# Patient Record
Sex: Male | Born: 1945 | Race: White | Hispanic: No | Marital: Single
Health system: Midwestern US, Community
[De-identification: ages and names within clinical notes are randomized; demographics above are authoritative.]

## PROBLEM LIST (undated history)

## (undated) DIAGNOSIS — Z96642 Presence of left artificial hip joint: Secondary | ICD-10-CM

## (undated) DIAGNOSIS — Z7901 Long term (current) use of anticoagulants: Secondary | ICD-10-CM

## (undated) DIAGNOSIS — F32A Depression, unspecified: Secondary | ICD-10-CM

## (undated) DIAGNOSIS — M170 Bilateral primary osteoarthritis of knee: Secondary | ICD-10-CM

## (undated) DIAGNOSIS — J42 Unspecified chronic bronchitis: Secondary | ICD-10-CM

## (undated) DIAGNOSIS — R5381 Other malaise: Secondary | ICD-10-CM

## (undated) DIAGNOSIS — J449 Chronic obstructive pulmonary disease, unspecified: Secondary | ICD-10-CM

## (undated) DIAGNOSIS — I82403 Acute embolism and thrombosis of unspecified deep veins of lower extremity, bilateral: Secondary | ICD-10-CM

## (undated) DIAGNOSIS — C4490 Unspecified malignant neoplasm of skin, unspecified: Secondary | ICD-10-CM

## (undated) DIAGNOSIS — Z87891 Personal history of nicotine dependence: Secondary | ICD-10-CM

## (undated) DIAGNOSIS — E43 Unspecified severe protein-calorie malnutrition: Secondary | ICD-10-CM

## (undated) DIAGNOSIS — Z9889 Other specified postprocedural states: Secondary | ICD-10-CM

## (undated) DIAGNOSIS — Z9981 Dependence on supplemental oxygen: Secondary | ICD-10-CM

## (undated) DIAGNOSIS — J439 Emphysema, unspecified: Secondary | ICD-10-CM

## (undated) DIAGNOSIS — H269 Unspecified cataract: Secondary | ICD-10-CM

## (undated) HISTORY — DX: Unspecified chronic bronchitis: J42

## (undated) HISTORY — PX: INGUINAL HERNIA REPAIR: SHX194

## (undated) HISTORY — DX: Personal history of nicotine dependence: Z87.891

## (undated) HISTORY — DX: Chronic obstructive pulmonary disease, unspecified: J44.9

## (undated) HISTORY — DX: Bilateral primary osteoarthritis of knee: M17.0

## (undated) HISTORY — DX: Unspecified cataract: H26.9

## (undated) HISTORY — PX: APPENDECTOMY: SHX54

## (undated) HISTORY — DX: Depression, unspecified: F32.A

## (undated) HISTORY — DX: Unspecified malignant neoplasm of skin, unspecified: C44.90

## (undated) HISTORY — DX: Other specified postprocedural states: Z98.890

## (undated) HISTORY — DX: Emphysema, unspecified: J43.9

---
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
## 1980-11-28 DIAGNOSIS — G40909 Epilepsy, unspecified, not intractable, without status epilepticus: Secondary | ICD-10-CM

## 1980-11-28 HISTORY — DX: Epilepsy, unspecified, not intractable, without status epilepticus: G40.909

## 2009-06-08 DIAGNOSIS — G40909 Epilepsy, unspecified, not intractable, without status epilepticus: Secondary | ICD-10-CM | POA: Insufficient documentation

## 2011-05-03 LAB — TYPE AND SCREEN
ABO/Rh: A POS
Antibody Screen: NEGATIVE

## 2011-05-03 LAB — URINALYSIS W/ RFLX MICROSCOPIC
Bilirubin: NEGATIVE
Glucose: NEGATIVE MG/DL
Ketone: NEGATIVE MG/DL
Leukocyte Esterase: NEGATIVE
Nitrites: NEGATIVE
Protein: NEGATIVE MG/DL
Specific gravity: 1.01 (ref 1.003–1.030)
Urobilinogen: 0.2 EU/DL (ref 0.2–1.0)
pH (UA): 6 (ref 5.0–8.0)

## 2011-05-03 LAB — PROTHROMBIN TIME + INR
INR: 0.9 (ref 0.0–1.2)
Prothrombin time: 12.2 s (ref 11.5–15.2)

## 2011-05-03 LAB — METABOLIC PANEL, BASIC
Anion gap: 6 mmol/L (ref 5–15)
BUN/Creatinine ratio: 11 — ABNORMAL LOW (ref 12–20)
BUN: 10 MG/DL (ref 7–18)
CO2: 30 MMOL/L (ref 21–32)
Calcium: 8.3 MG/DL — ABNORMAL LOW (ref 8.4–10.4)
Chloride: 102 MMOL/L (ref 100–108)
Creatinine: 0.9 MG/DL (ref 0.6–1.3)
GFR est AA: >60 mL/min/{1.73_m2} (ref >60)
GFR est non-AA: >60 mL/min/{1.73_m2} (ref >60)
Glucose: 91 MG/DL (ref 74–99)
Potassium: 4.5 MMOL/L (ref 3.5–5.5)
Sodium: 138 MMOL/L (ref 136–145)

## 2011-05-03 LAB — CBC W/O DIFF
HCT: 46.9 % (ref 36.0–48.0)
HGB: 15.8 g/dL (ref 13.0–16.0)
MCH: 32.8 PG (ref 24.0–34.0)
MCHC: 33.7 g/dL (ref 31.0–37.0)
MCV: 97.5 FL — ABNORMAL HIGH (ref 74.0–97.0)
MPV: 10 FL (ref 9.2–11.8)
PLATELET: 304 10*3/uL (ref 135–420)
RBC: 4.81 M/uL (ref 4.70–5.50)
RDW: 12.8 % (ref 11.6–14.5)
WBC: 7.3 10*3/uL (ref 4.6–13.2)

## 2011-05-03 LAB — HEMOGLOBIN A1C WITH EAG: Hemoglobin A1c: 5.4 % (ref 4.8–6.0)

## 2011-05-03 LAB — PTT: aPTT: 30.3 s (ref 24.6–37.7)

## 2011-05-03 LAB — TYPE & SCREEN
ABO/Rh(D): A POS
Antibody screen: NEGATIVE

## 2011-05-03 LAB — URINE MICROSCOPIC ONLY
RBC: 0 /HPF (ref 0–5)
WBC: 0 /HPF (ref 0–4)

## 2011-05-03 LAB — ALBUMIN: Albumin: 3.7 g/dL (ref 3.4–5.0)

## 2011-05-04 LAB — CULTURE, URINE
Culture result:: NO GROWTH
Culture: NO GROWTH

## 2011-05-13 NOTE — H&P (Deleted)
[facility]                               [address] [place], [place] [address]                                   PRE-ADMISSION                             HISTORY AND PHYSICAL    PATIENT:    [name] [name]  MRN:           [id number]     DATE:   05/03/2011  BILLING:       [id number]    ROOM:  DICTATING:  Gifford Shave, MD,FRCSC      CHIEF COMPLAINT: Right hip end-stage osteoarthritis.    HISTORY OF PRESENT ILLNESS: The patient is a pleasant, 65 year old  gentleman with long standing history of right hip pain, has failed  conservative measures; therefore, admitted for surgical intervention as  indicated. The ________alternatives, including but not limited to  infection, blood transfusion, neurovascular damage, bone fracture,  anesthetic complications, DVT, PE, postoperative stiffness, longevity, leg  length discrepancy, hip dislocation, need for more surgery, MI and stroke  were discussed. The patient understands and agrees for surgery.    PAST MEDICAL HISTORY: Positive for COPD, hepatitis, asthma.    MEDICATIONS: Include,  1. Tegretol.  2. Spiriva.  3. His aspirin on hold.  4. Albuterol.    ALLERGIES: NO KNOWN DRUG ALLERGIES.    PAST SURGICAL HISTORY: None.    PHYSICAL EXAMINATION  GENERAL: Alert and oriented x3, in no acute distress, well-developed.  HEART: S1 and S2, regular rate and rhythm.  LUNGS: Clear to auscultation bilaterally.  ABDOMEN: Soft, nontender, nondistended.  EXTREMITIES: Good pulses, dorsalis pedis, tibial, sensory intact to light.  Leg lengths revealed the right lower extremity slightly shorter grossly  sitting in a chair.    Preoperative labs reviewed, within normal limits. Urinalysis negative.  Radiographs including AP pelvis, AP right hip, cross-table of the right hip  show significant end-stage osteoarthritis with bone-on-bone eburnation.    ASSESSMENT: Right hip end-stage osteoarthritis.     PLAN: At this point, proceed with a right total knee replacement. Again,  alternatives, risks and complications, as well as expected outcomes were  discussed. The patient understands surgery.        Dictated By: Sharen Hones, PA-C                  Date:______Time:______Signature________________________________          Gifford Shave, MD,FRCSC    ALM:wmx  D: 05/13/2011   9:51 A  T: 05/13/2011  10:11 A  Job: [id number]  CScriptDoc #: [id number]    cc:    Gifford Shave, MD,FRCSC

## 2011-05-13 NOTE — H&P (Unsigned)
[facility]                               [address] [place], [place] [address]                                   PRE-ADMISSION                             HISTORY AND PHYSICAL    PATIENT:    [name] [name], [name] [name]  MRN:           [id number]     DATE:   05/17/2011  BILLING:       [id number]    ROOM:  DICTATING:  Gifford Shave, MD,FRCSC      CHIEF COMPLAINT: Right hip end-stage osteoarthritis.    HISTORY OF PRESENT ILLNESS: The patient is a pleasant, 65 year old  gentleman with longstanding history of right hip pain, has failed  conservative measures; therefore, admitted for surgical intervention as  indicated. The risks and including but not limited to infection, blood  transfusion, neurovascular damage, bone fracture, anesthetic complications,  DVT, PE, postoperative stiffness, longevity, leg length discrepancy, hip  dislocation, need for more surgery, MI and stroke were discussed. The  patient understands and agrees for surgery.    PAST MEDICAL HISTORY: Positive for COPD, hepatitis and asthma.    MEDICATIONS: Include  1. Tegretol.  2. Spiriva.  3. His aspirin on hold.  4. Albuterol.    ALLERGIES: NO KNOWN DRUG ALLERGIES.    PAST SURGICAL HISTORY: None.    PHYSICAL EXAMINATION  GENERAL: Alert and oriented x3, in no acute distress, well-developed.  HEART: S1 and S2, regular rate and rhythm.  LUNGS: Clear to auscultation bilaterally.  ABDOMEN: Soft, nontender, nondistended.  EXTREMITIES: Good pulses, dorsalis pedis, tibial. Sensory intact to light.  Leg lengths revealed the right lower extremity slightly shorter grossly  sitting in a chair.    Preoperative labs reviewed, within normal limits. Urinalysis negative.  Radiographs including AP pelvis, AP right hip, cross-table of the right hip  show significant end-stage osteoarthritis with bone-on-bone eburnation.    ASSESSMENT: Right hip end-stage osteoarthritis.     PLAN: At this point, proceed with a right total knee replacement. Again,  alternatives, risks and complications, as well as expected outcomes were  discussed. The patient understands surgery.        Dictated By: Sharen Hones, PA-C                  Date:______Time:______Signature________________________________          Gifford Shave, MD,FRCSC    ALM:wmx  D: 05/13/2011   9:51 A  T: 05/13/2011  10:11 A  Job: [id number]  CScriptDoc #: [id number]    cc:   Gifford Shave, MD,FRCSC

## 2011-05-17 LAB — TYPE AND SCREEN
ABO/Rh: A POS
Antibody Screen: NEGATIVE

## 2011-05-17 LAB — TYPE & SCREEN
ABO/Rh(D): A POS
Antibody screen: NEGATIVE

## 2011-05-17 NOTE — Op Note (Unsigned)
[facility]                               [address] [place], [place] [address]                                 OPERATIVE REPORT    PATIENT:     [name] [name], [name] [name]  MRN:            [id number]    DATE:   05/17/2011  BILLING:     [id number]  ROOM:        [id number]  ATTENDING:   Gifford Shave, MD,FRCSC  DICTATING:   Gifford Shave, MD,FRCSC      PREOPERATIVE DIAGNOSIS: End-stage arthritis, right hip, with significant  ankylosis and pericapsular scarring, end-stage arthritis, right hip.    POSTOPERATIVE DIAGNOSIS: End-stage arthritis, right hip, with significant  ankylosis and pericapsular scarring, end-stage arthritis, right hip.    OPERATIVE PROCEDURE: Right total hip replacement using the Synergy system  with [name] size 13 high offset femoral component, [name] 60 R3 cup with 2-screw  augmentation and [name] neutral 36 liner, 0+36 Oxinium femoral head with [name]  partial capsulectomy and removal of osteophytosis.    SURGEON:  Paris Lore, MD    FIRST ASSISTANT: Carin Hock.    SECOND ASSISTANT: Merian Capron.    ANESTHESIA: Dr. Junius Finner, preoperative lumbar plexus block with light  general.    DESCRIPTION OF OPERATIVE PROCEDURE: After the anesthetic was successfully  induced, it was confirmed the patient did receive preoperative antibiotics  and Coumadin.  The ________requires opposite hip to be replaced.  He understands he will have [name] leg length discrepancy until both hips have  been replaced.    The patient was placed in lateral decubitus position, taking great care to  pad all sensitive areas and ensure the pelvis was square. The appropriate  time-out was performed, confirmed the patient did receive an antibiotic and  also Coumadin. Lateral approach to the hip performed. Good hemostasis  achieved with electrocautery. IT band delineated and incised sharply.  Sciatic nerve identified, protected and avoided, and Charnley retractor   introduced. The bursa was released using the capsular scissors. He did have  [name] partial chronic muscle tear involving the lateral aspect of the greater  trochanter, which had about [name] 1 cm bald area.  We roughened the bone up and  repaired this later.    Minimus and capsule were divided directly over femoral neck. The abductors  were in modest condition due to disuse.    Minimus and capsule were divided directly over the femoral neck, carrying  back to the tip of the greater trochanter entry and down the vastus  lateralis. The whole cuff of tissue was taken in 1 contiguous layer and the  lesser trochanter identified. [name] pin was placed in the superior iliac crest  for offset leg length measurement. We released some further capsule and  dislocated the hip uneventfully.    Using [name] smallish fingerbreadth above the lesser trochanter as per the  preoperative templating, femoral neck was divided with 2 blunt Hohmann's  protecting the soft tissue structures. Posterior capsule was somewhat  adherent and reflected with the Cobb elevator.  Blunt Hohmann placed in the anterior acetabulum, which was fairly  hypoplastic as well. Bent sharp Hohmann posteriorly, transverse acetabular  had hypertrophied and was calcified and was taken down with [name] combination  of electrocautery and rongeur and bent blunt Hohmann placed inferiorly.  It  gave [place] excellent exposure of the cup. We debrided the cup in the usual  fashion.  We identified the medial wall through the foveal notch and then  medialized appropriately in appropriate inclination and anteversion, reamed  up to accommodate the cup, initially underreamed by 1 x 1 for alignment to  align the ream.    Trialed this. I was very happy with the 60.  Implanted the definitive  component, which seated well and am ensured that we are at the appropriate  inclination and anteversion.    Augmented with [name] couple of screws in the safe zone with tap-drill style   technique removed the osteophytosis and then used the Aquamantys and  [employer] preparation.    ________the apex hole eliminator screw, followed by the definitive liner,  made sure it was fully seated and checked it with [name] Glorious Peach, used the  Colgate preparation around the cup. Protected with [name]  Ray-Tec gauze that later would be accounted for.    Placed the leg in [name] sterile leg bag and protected the abductors,  lateralized appropriately using [name] box osteotome and Leksell rongeur and  then reamed and broached sequentially to accommodate the 13. Used the  calcar planer and trialed this.  I was very happy with the 0 ball. The hip  was very stable and there was no impingement. We checked specifically in  external rotation, extension, flexion, adduction, internal rotation, drop  kick and shuck test, all of which we were delighted with. There was no  impingement, very stable hip.    Pulse lavaged out, implanted the definitive component, which seated well  and retrialed, and I was happiest with the 0, cleaned the trunnion,  impacted on definitive head, again reducing the hip.    Routine closure after further pulse lavage and further controlled  hemostasis and clips for the skin.    At the end of the case, instrument, sponge and needle counts were correct.  There were no complications.    The patient tolerated the procedure well.    Blood loss just under 300 mL.    The patient tolerated the procedure well and I was quite delighted with the  outcome of the case.                        Date:______Time:______Signature________________________________          Gifford Shave, MD,FRCSC    ALM:wmx  D: 05/17/2011 T: 05/17/2011  7:08 P  Job: [id number]  CScriptDoc #: [id number]    cc:     Gifford Shave, MD,FRCSC

## 2011-05-17 NOTE — Consults (Signed)
[facility]                               [address] [place], [place] [address]                                CONSULTATION REPORT    PATIENT:    [name] [name], [name] [name]  MRN:            [id number]  ADMIT DATE:   05/17/2011  BILLING:        [id number] CONSULTED:    05/17/2011  ROOM:       [id number]  ATTENDING:  Gifford Shave, MD,FRCSC  DICTATING:  Zollie Pee, MD        REQUESTING PHYSICIAN: Paris Lore, MD    CONSULTING PHYSICIAN: Zollie Pee, MD    REASON FOR CONSULTATION: COPD, nicotine dependence, postoperative right hip  replacement surgery.    HISTORY OF PRESENT ILLNESS: This is a pleasant, 65 year old male patient  who has presented to the hospital after all the conservative management  failed to control his pain secondary to the osteoarthritis of his right  hip. The patient is postop, and his pain seems to be controlled and after  the surgery. According to the patient, he has been diagnosed with COPD for  the last 3 or 4 years, when he had a pulmonary function test done. He has  been started on albuterol and Spiriva. Currently he is well controlled. He  has had no recent hospitalization in the last 2-3 years. He reported no  pneumonia. He has been up-to-date with his flu vaccine, but no pneumonia  vaccination. The patient also reports no issues with walking on flat  surfaces or lifting stuff around; however, he gets short of breath on  taking a flight of stairs. He reports no limitation to activity of daily  living. No chronic cough or phlegm, wheeze, hemoptysis, chest tightness or  pressure symptoms. He also denied any palpitations, lightheadedness,  orthopnea, paroxysmal nocturnal dyspnea, leg swelling, syncope. The patient  denied any recent travel, sick contact, fever, chills, malaise, change in  his appetite or weight. He is currently followed up by his medical doctor,   Dr. Odis Luster. The patient reports no previous ICU admission, BiPAP  or ventilator use, steroid use, recent antibiotic therapy or  hospitalization for COPD exacerbation.    PAST MEDICAL HISTORY: Significant for COPD, nicotine dependence, basal cell  cancer of the skin, seizure disorder.    PAST SURGICAL HISTORY: Inguinal herniorrhaphy, appendectomy.    ALLERGIES: NO KNOWN DRUG ALLERGIES.    SOCIAL HISTORY: He is an active smoker, smokes 1 pack per day for the last  45 years or more. He has tried to quit cold [place], with success for 2  years maximum. Currently he has a plan to quit; no set date. He does not  want to use any medications or pharmacological options. He is retired and  lives on his own.    FAMILY HISTORY: His father died of lung cancer, also had coronary artery  disease, MI. Mother is alive and healthy. Sisters are alive and healthy.  Daughter is alive and healthy.    REVIEW OF SYSTEMS  GENERAL: He reports no fever, chills,  malaise, change in his appetite or  weight.  HEENT: No headache, blurred vision, change in his voice, hearing,  swallowing.  NECK: No neck pain. No thyroid adenopathy or other mass or enlargement.  CARDIOVASCULAR: As above.  RESPIRATORY: As above.  GASTROINTESTINAL: No nausea, vomiting, abdominal pain, change in his bowel  habits, blood in the stool.  GENITOURINARY: No dysuria, urgency, frequency, hematuria.  MUSCULOSKELETAL: As above.  SKIN: No rash. No itching.  ENDOCRINE: No personal history of diabetes or thyroid disorders.  HEMATOLOGIC: No bleeding, clotting disorders.  NEUROLOGY: As above.    Other review of systems is per HPI or noncontributory.    PHYSICAL EXAMINATION  VITAL SIGNS: Temperature 97.6, heart rate 60, blood pressure 129/60,  respiratory rate 16, saturation oxygen 95% on room air.  HEENT: Head normocephalic, atraumatic. Pupils are equally round, reactive  to light and accommodation. Sclerae white. Conjunctivae pale. Oropharyngeal   mucous membranes moist, without any mass lesion or exudate.  NECK: Thin, supple. No JVD. No thyroid. No adenopathy. No other mass  palpable.  CARDIOVASCULAR: S1, S2 regular rate and rhythm without any murmur, gallop,  rub.  CHEST: Prolonged expiratory phase with inspiratory rhonchi, but no  expiratory wheezes. There is no dullness to percussion, no tenderness on  palpation, no rash on inspection.  ABDOMEN: Obese. Bowel sounds normoactive. Soft, nontender, without any  organomegaly.  EXTREMITIES: Without any edema, cyanosis, clubbing. No calf tenderness on  palpation.    LABORATORY AND DIAGNOSTIC DATA: Chest x-ray from 05/03/2011 shows no  evidence of acute cardiopulmonary disease. Heart and mediastinum  unremarkable. No evidence of pleural effusion, moderate thoracic  spondylosis, 2 calcified granulomata in the left lung lower zone.    IMPRESSION: The patient is now presenting with a previous history of  chronic obstructive pulmonary disease, osteoarthritis, right hip pain from  osteoarthritis, failed conservative management. He is postop. He is  currently controlled with his pain medication. At this point his chronic  obstructive pulmonary disease seems to be stable, without any bronchospasm  or exacerbation, though given his chronic obstructive pulmonary disease, I  have discussed a couple of perioperative complications in a patient with  chronic obstructive pulmonary disease, including aspiration pneumonias,  hospital-acquired pneumonias, atelectasis, etc., and need for some  measures, including early ambulation, use of incentive spirometer, deep  vein thrombosis prophylaxis. The patient understands and is agreeable.  Smoking cessation was also discussed at length with the patient. He seems  to be motivated and wants to do it without any pharmacological help. I have  also discussed with the patient the importance of prevention in the form of   vaccination. He seems to be up-to-date with his flu vaccination, but he may  benefit from pneumonia vaccination, which the patient is agreeable to.    RECOMMENDATIONS: I would continue the patient's Spiriva and albuterol. At  the same time I would have the patient given pneumonia vaccination during  this hospitalization, at the same time continue DVT prophylaxis, physical  therapy and pain control per surgery team. Incentive spirometer training  and treatment were also discussed with the patient.    Thank you very much for involving me in the care of this patient. I will  follow up this patient in the hospital with you.                         Electronically Signed   Zollie Pee, MD 06/25/2011 14:44  Zollie Pee, MD    AP:[email or web address]  D: 05/17/2011 T: 05/17/2011  6:37 P  Job: [id number]  CScriptDoc #: [id number]  cc:   Gifford Shave, MD,FRCSC        Zollie Pee, MD        Clemencia Course

## 2011-05-18 LAB — PROTHROMBIN TIME + INR
INR: 1.3 — ABNORMAL HIGH (ref 0.0–1.2)
Prothrombin time: 15.8 s — ABNORMAL HIGH (ref 11.5–15.2)

## 2011-05-18 LAB — HEMATOCRIT: HCT: 33.9 % — ABNORMAL LOW (ref 36.0–48.0)

## 2011-05-18 LAB — PTT: aPTT: 34.2 s (ref 24.6–37.7)

## 2011-05-18 LAB — HEMOGLOBIN: HGB: 11.1 g/dL — ABNORMAL LOW (ref 13.0–16.0)

## 2011-05-18 NOTE — Consults (Addendum)
[facility]                               [address] [place], [place] [address]                                CONSULTATION REPORT    PATIENT:    Weimer, Davidlee A  MRN:            [id number]  ADMIT DATE:   05/17/2011  BILLING:        [id number] CONSULTED:    05/19/2011  ROOM:       [id number]  ATTENDING:  Gifford Shave, MD,FRCSC  DICTATING:  Sandi Raveling, MD        REASON FOR CONSULTATION: Urinary retention.    HISTORY OF PRESENT ILLNESS: The patient is a 65 year old Caucasian male  with a longstanding history of severe arthritis in his hip. He is 1 day  status post right total hip. Postoperative he is unable to urinate. The  nursing staff has been unable to place a Foley. He denies any prior history  of gross hematuria, urolithiasis, UTI or voiding symptoms.    PAST MEDICAL HISTORY: Remarkable for COPD and hepatitis.    PAST SURGICAL HISTORY: Include a hernia repair.    MEDICATIONS ON ADMISSION:  1. Spiriva.  2. Tegretol.  3. Albuterol.    PHYSICAL EXAMINATION: Showed stigma of COPD. He has a few rhonchi  bilaterally. The abdomen is soft. There is no mass or tenderness. He has a  16 [place]  catheter in the penis. Testes are unremarkable. I am unable  to turn him on his side for an adequate rectal exam.    PERTINENT LABS: Otherwise unremarkable. His hemoglobin is 11.1 and a  creatinine is 0.9.    IMPRESSION:  1. Postoperative urinary retention.  2. Chronic obstructive pulmonary disease.  3. Status post right total hip replacement.  4. Nicotine addiction.    PLAN: I have started him on Rapaflo and will remove the Foley catheter in 2  days. At that time we will check postvoid residuals. As long as he is able  to void on the Rapaflo would not pursue any further workup, but will  recheck him in my office in several weeks.    I thank you very much for the opportunity to participate in the patient's  care.                        Electronically Signed   Sandi Raveling, MD 05/23/2011 08:53          Sandi Raveling, MD    NPG:WMX  D: 05/18/2011 T: 05/18/2011  7:38 A  Job: [id number]  CScriptDoc #: [id number]  cc:   Sandi Raveling, MD        Gifford Shave, MD,FRCSC

## 2011-05-19 LAB — URINALYSIS W/MICROSCOPIC
Glucose: NEGATIVE MG/DL
Ketone: 40 MG/DL — AB
Nitrites: NEGATIVE
RBC: 4 /HPF (ref 0–5)
Specific gravity: >1.03 — ABNORMAL HIGH (ref 1.003–1.030)
Urobilinogen: 0.2 EU/DL (ref 0.2–1.0)
WBC: 11 /HPF (ref 0–4)
pH (UA): 6 (ref 5.0–8.0)

## 2011-05-19 LAB — ELECTROLYTES
Anion gap: 9 mmol/L (ref 5–15)
CO2: 26 MMOL/L (ref 21–32)
Chloride: 102 MMOL/L (ref 100–108)
Potassium: 3.6 MMOL/L (ref 3.5–5.5)
Sodium: 137 MMOL/L (ref 136–145)

## 2011-05-19 LAB — PROTHROMBIN TIME + INR
INR: 1.3 — ABNORMAL HIGH (ref 0.0–1.2)
Prothrombin time: 15.6 s — ABNORMAL HIGH (ref 11.5–15.2)

## 2011-05-19 LAB — PTT: aPTT: 39.4 s — ABNORMAL HIGH (ref 24.6–37.7)

## 2011-05-19 LAB — BILIRUBIN, CONFIRM: Bilirubin UA, confirm: NEGATIVE

## 2011-05-19 LAB — HEMATOCRIT: HCT: 34.1 % — ABNORMAL LOW (ref 36.0–48.0)

## 2011-05-19 LAB — HEMOGLOBIN: HGB: 11.2 g/dL — ABNORMAL LOW (ref 13.0–16.0)

## 2011-05-19 LAB — CREATININE
Creatinine: 0.6 MG/DL (ref 0.6–1.3)
GFR est AA: >60 mL/min/{1.73_m2} (ref >60)
GFR est non-AA: >60 mL/min/{1.73_m2} (ref >60)

## 2011-05-20 LAB — CULTURE, URINE
Culture result:: NO GROWTH
Culture: NO GROWTH

## 2011-05-20 LAB — HEMATOCRIT: HCT: 34.2 % — ABNORMAL LOW (ref 36.0–48.0)

## 2011-05-20 LAB — PROTHROMBIN TIME + INR
INR: 2 — ABNORMAL HIGH (ref 0.0–1.2)
Prothrombin time: 22.3 s — ABNORMAL HIGH (ref 11.5–15.2)

## 2011-05-20 LAB — PTT: aPTT: 55.9 s — ABNORMAL HIGH (ref 24.6–37.7)

## 2011-05-20 LAB — HEMOGLOBIN: HGB: 11.3 g/dL — ABNORMAL LOW (ref 13.0–16.0)

## 2011-07-11 NOTE — Discharge Summary (Signed)
[facility]                               [address]                          [place], [place] [address]                                 DISCHARGE SUMMARY    PATIENT:   [name] [name]  MRN:           [id number]  ADMIT DATE:    05/17/2011  BILLING:       [id number] [facility]  DATE:     05/20/2011  ATTENDING: Gifford Shave, MD,FRCSC  DICTATING  Gifford Shave, MD,FRCSC      PRIMARY DIAGNOSIS: Right hip end-stage osteoarthritis.    SECONDARY DIAGNOSIS: Urinary retention.    HISTORY OF PRESENT ILLNESS: This patient is [name] pleasant, 65 year old  gentleman, with [name] longstanding history of right hip pain who has failed  conservative measures; therefore, surgical intervention is indicated. The  alternatives, risks, and complications, as well as expected outcomes were  discussed. The patient understands and wishes to proceed with surgery.    PAST MEDICAL HISTORY: Positive for COPD, hypertension, asthma.    MEDICATIONS  1. Tegretol.  2. Spiriva.  3. Aspirin.  4. Albuterol.    ALLERGIES: NO KNOWN DRUG ALLERGIES.    PAST SURGICAL HISTORY: None.    PHYSICAL EXAMINATION  GENERAL: Alert and oriented x3, in no acute distress, well-developed,  well-nourished. Normal affect.  HEART: S1 and S2. Regular rate and rhythm.  LUNGS: Clear to auscultation bilaterally.  ABDOMEN: Soft, nontender, nondistended. Positive bowel sounds in all 4  quadrants.  EXTREMITIES: Positive distal pulses, 2+ dorsalis pedis and posterior  tibial.  NEUROLOGIC: Sensory intact to light touch. Motor is intact.    HOSPITAL COURSE: On 05/17/2011, Dr. Meyer Cory performed [name] right total hip  replacement.    POSTOPERATIVE COURSE: Postoperatively, the patient tolerated the procedure  well and was followed by Internal Medicine for help with medical  management. The patient was placed on antibiotics pre- and postoperatively  for prophylaxis against infection, as well as Coumadin pre- and post for   prophylaxis against DVT. The patient was followed by daily PT/INR and  Coumadin dosed appropriately. The patient also had Lovenox to adjunct  Coumadin therapy. The patient's vital signs remained stable and he remained  afebrile. The patient did develop urinary retention. Urology was consulted  for help with management. He had [name] PT, weightbearing as tolerated, and  progressed well with this. Pain was controlled. Negative calf tenderness or  swelling. No evidence of DVT present.    DISCHARGE PLAN: The patient will be discharged to home. Continue with home  health, PT, and nursing daily x14 days. Total hip protocol. He was given  prescriptions for Percocet and Coumadin. Orders per Urology. He will follow  up with [place] in the office in 12 days' time for evaluation.        Dictated By: Sharen Hones, PA-C                Date:______Time:______Signature________________________________          Gifford Shave, MD,FRCSC    ALM:wmx  D: 07/11/2011 T: 07/11/2011  9:14 [name]  Job: [id number]  CScriptDoc #: [id number]  cc:   Shirlee Latch, MD,FRCSC

## 2011-07-20 NOTE — Progress Notes (Signed)
PT DAILY TREATMENT NOTE     Patient Name: [name] [name]  Date:07/20/2011  DOB: 1946-03-06  [x]   Patient DOB Verified  Payor: BLUE CROSS  [place]: [facility] [place] [facility]  Product Type: PPO     In time:9:00  Out time:9:35  Total Treatment Time (min): 35  Total Timed Codes (min): 35  1:1 Treatment Time (MC only): n/a   Visit #: 8 of 16    Treatment Area: Right THR    SUBJECTIVE  Pain Level (0-10 scale): 0/10  Any medication changes, allergies to medications, adverse drug reactions, diagnosis change, or new procedure performed?: [x]  No    []  Yes (see summary sheet for update)  Subjective functional status/changes:   []  No changes reported  "Still gets tight and I still have a Age p!"    OBJECTIVE  Modality (rationale):   []   E-Stim: type _ x _ min     [] att   [] unatt   [] w/US   [] w/ice   [] w/heat  []   Traction: [] cerv   [] pelvic   _ lbs x _ min     [] pro   [] sup   [] int   [] const  []   Ultrasound: [] cont   [] pulse    _ W/cm2 x _  min   []   []  []   Iontophoresis: [] take home patch w/ dexamethazone    [] 40mA   [] 80mA                               [] _ mA min w/: [] dexamethazone   [] other:_  []   Ice pack _  min     []  Hot pack _  min     []  Paraffin _  min  []   Other:     Therapeutic Exercise: [x]  see flow sheet     []  Other:_ (minutes) :35  Added/Changed Exercises:  []   Added:_  to improve (function):  []   Changed:_ to improve (function):    Therapeutic Activity:      (minutes) :    Manual Therapy:       (minutes) :      Gait Training: []  _ feet w/ _ device on level surface with _ level of assist  []   Other:_       (minutes)     Patient Education: []  Review HEP    []  Progressed/Changed HEP based on:_   []  positioning   []  body mechanics   []  transfers   []  heat/ice application   (minutes) :    Other Objective/Functional Measures: Pt. Reported no p! With ex. Today.  Strength continues to improve.  Pt. No longer ambulates with straight cane.  Pain Level (0-10 scale) post treatment: 0/10    ASSESSMENT   []   See Plan of Care  []   See progress note/recertification  [x]   Patient will continue to benefit from skilled therapy to address remaining functional deficits: Increased stiffness and p!    Progress towards goals:Con't towards LTGs.      PLAN  []   Upgrade activities as tolerated     [x]   Continue plan of care  []   Discharge due to:_  []  Other:_        Minus Breeding, PTA       07/20/2011        9:32 AM

## 2011-07-22 NOTE — Progress Notes (Signed)
PT DAILY TREATMENT NOTE     Patient Name: [name] [name]  Date:07/22/2011  DOB: [birth year]  [x]   Patient DOB Verified  Payor: BLUE CROSS  Plan: [facility]  Product Type: PPO     In time: 9:00  Out time:9:42 AM   Total Treatment Time (min): 42  Total Timed Codes (min): 42  1:1 Treatment Time (MC only): N/A   Visit #: 9 of 18    Treatment Area: Right THR    SUBJECTIVE  Pain Level (0-10 scale): 0-1/10  Any medication changes, allergies to medications, adverse drug reactions, diagnosis change, or new procedure performed?: [x]  No    []  Yes (see summary sheet for update)  Subjective functional status/changes:   []  No changes reported  "I have a Wisnieski soreness in the hip but the MD told me yesterday that was normal.  He wants me to be able to lift my leg with weights."    OBJECTIVE    Therapeutic Exercise: [x]  see flow sheet     []  Other:_ (minutes) :42  Added/Changed Exercises:  [x]   Added: 1/2 prone donkey kicks, hip Ext, hip ABD  to improve (function): right hip strength and stability.  [x]   Changed: Increased to 3#; increased reps per flow sheet to improve (function): strength and right hip stability.    Patient Education: [x]  Review HEP    []  Progressed/Changed HEP based on:_   []  positioning   []  body mechanics   []  transfers   []  heat/ice application   (minutes) :    Other Objective/Functional Measures:  Pt reported good workout today.  Pt exhibited increased weakness in right Hip ABD, ER.  Pt still has increased difficulty with stairs, having to take one step at a time.  Right LE strength 5/5 except 3+/5 Hip ABD, ER/IR.    Pain Level (0-10 scale) post treatment: 0/10    ASSESSMENT  []   See Plan of Care  [x]   See progress note/recertification  [x]   Patient will continue to benefit from skilled therapy to address remaining functional deficits: right hip weakness and instability.     Progress towards goals:  Pt progressing well towards increased strength and stability in right hip.  New goal:  Pt will perform 15 reps of SLR ABD with 5# weight to show good right hip strength and stability.      PLAN  []   Upgrade activities as tolerated     [x]   Continue plan of care  []   Discharge due to:_  []  Other:_        Keaston Pile, PT       07/22/2011        9:07 AM

## 2011-07-22 NOTE — Progress Notes (Signed)
[facility]  [address]  [place], [place]  [address]  Phone: [phone]      Fax:  [phone]    Progress Note    Name: [name] [name]   DOB: [birth year]   MD: Gifford Shave, MD       Treatment Diagnosis: Pain in joint, pelvic region and thigh [719.45]   Start of Care: 05/30/11    Visits from Start of Care: 9  Missed Visits: 0    Summary of Care: Pt is progressing well with therapy.  Pt has had two visits in therapy since returning from about a three week absence from being on vacation.  Pt's right LE strength is 5/5 except 3+/5 Hip ABD, ER, IR with some pain.  Pt still has difficulty with stairs, having to take each one step at a time.  Pt's LEFS score increased 3 pts to 29/80 to show some increase in function.  Pt is now starting to use 3# weight with exercises to continue progressing towards increased strength and stability.    Assessment / Recommendations:   Other: Pt would benefit from continued skilled PT to meet the following goals: 1) Pt to increase right LE strength to at least 4/5 in Hip ABD, ER, IR to increase stability with stdg and ambulation; 2) Pt to ambulate up and down stairs with reciprocal gait pattern with 1 UE support to navigate steps at home; 3) Pt to increase LEFS score by 14 pts to show increased function and quality of life.    Patient to be seen: 3 times per week for 6 weeks.    Kiarrah Rausch, PT 07/22/2011 9:47 AM    ________________________________________________________________________  NOTE TO PHYSICIAN:  Please complete the following and fax to:   [facility] at [phone]  . Retain this original for your records.  If you are unable to process this request in 24 hours, please contact our office.       ____ I have read the above report and request that my patient continue therapy with the following changes/special instructions:  ____ I have read the above report and request that my patient be discharged from therapy     Physician's Signature:_________________ Date:___________Time:__________

## 2011-07-26 NOTE — Progress Notes (Signed)
PT DAILY TREATMENT NOTE     Patient Name: [name] [name]  Date:07/26/2011  DOB: [birth year]  [x]   Patient DOB Verified  Payor: BLUE CROSS  Plan: [facility] [place] [facility]  Product Type: PPO     In time:9:00  Out time:9:40  Total Treatment Time (min): 40  Total Timed Codes (min): 40  1:1 Treatment Time (MC only): N/A   Visit #: 10 of 18    Treatment Area: Right THR    SUBJECTIVE  Pain Level (0-10 scale): 0/10  Any medication changes, allergies to medications, adverse drug reactions, diagnosis change, or new procedure performed?: [x]  No    []  Yes (see summary sheet for update)  Subjective functional status/changes:   []  No changes reported  "Just stiffness today.  I was sore after last time.  I guess that's what its supposed to do."    OBJECTIVE    Therapeutic Exercise: [x]  see flow sheet     []  Other:_ (minutes) : 40  Added/Changed Exercises:  []   Added:_  to improve (function):  [x]   Changed: Increased reps per flow sheet to improve (function): strength and stability.      Patient Education: [x]  Review HEP    []  Progressed/Changed HEP based on:_   []  positioning   []  body mechanics   []  transfers   []  heat/ice application   (minutes) :    Other Objective/Functional Measures:  Pt had no increased pain with exercises today.   Pain Level (0-10 scale) post treatment: 0/10    ASSESSMENT  []   See Plan of Care  []   See progress note/recertification  [x]   Patient will continue to benefit from skilled therapy to address remaining functional deficits: decreased right hip ABD, ER, IR strength and stability.    Progress towards goals: Pt steadily progressing towards goals.      PLAN  [x]   Upgrade activities as tolerated     []   Continue plan of care  []   Discharge due to:_  []  Other:_        Brookelle Pellicane, PT       07/26/2011        9:09 AM

## 2011-07-27 NOTE — Progress Notes (Signed)
PT DAILY TREATMENT NOTE     Patient Name: [name] [name]  Date:07/27/2011  DOB: 08-14-1946  [x]   Patient DOB Verified  Payor: BLUE CROSS  Plan: [facility] BLUE [facility] [place] [facility]  Product Type: PPO     In time:9:30  Out time:10:10  Total Treatment Time (min): 40  Total Timed Codes (min): 40  1:1 Treatment Time (MC only): N/A   Visit #: 11 of 18    Treatment Area: Right THR    SUBJECTIVE  Pain Level (0-10 scale): 0/10  Any medication changes, allergies to medications, adverse drug reactions, diagnosis change, or new procedure performed?: [x]  No    []  Yes (see summary sheet for update)  Subjective functional status/changes:   []  No changes reported  "I'm not in pain, just have stiffness."    OBJECTIVE    Therapeutic Exercise: [x]  see flow sheet     []  Other:_ (minutes) :40  Added/Changed Exercises:  []   Added:_  to improve (function):  []   Changed:_ to improve (function):    Patient Education: [x]  Review HEP    []  Progressed/Changed HEP based on:_   []  positioning   []  body mechanics   []  transfers   []  heat/ice application   (minutes) :    Other Objective/Functional Measures: Pt showed no signs of pain or discomfort while performing exercises.  Pt did not need cues for correct technique.  Pt did show signs of fatigue towards the end of session.   Pain Level (0-10 scale) post treatment: 3/10    ASSESSMENT  []   See Plan of Care  []   See progress note/recertification  [x]   Patient will continue to benefit from skilled therapy to address remaining functional deficits: decreased strength in right hip; decreased stability; difficulty with stairs.    Progress towards goals: Pt is progressing well towards goals.       PLAN  []   Upgrade activities as tolerated     [x]   Continue plan of care  []   Discharge due to:_  []  Other:_        Kahlyn Shippey, PT       07/27/2011        10:30 AM

## 2011-08-03 NOTE — Progress Notes (Signed)
PT DAILY TREATMENT NOTE     Patient Name: [name] [name]  Date:08/03/2011  DOB: 01/15/1946  [x]   Patient DOB Verified  Payor: BLUE CROSS  Plan: [facility] [place] [facility]  Product Type: PPO     In time:8:33  Out time:9:13  Total Treatment Time (min): 40  Total Timed Codes (min): 40  1:1 Treatment Time (MC only): N/A   Visit #: 12 of 18    Treatment Area: Right THR    SUBJECTIVE  Pain Level (0-10 scale): 0/10  Any medication changes, allergies to medications, adverse drug reactions, diagnosis change, or new procedure performed?: [x]  No    []  Yes (see summary sheet for update)  Subjective functional status/changes:   [x]  No changes reported  "Just the same stiffness."    OBJECTIVE    Therapeutic Exercise: [x]  see flow sheet     []  Other:_ (minutes) :  Added/Changed Exercises:  [x]   Added:Hip ADD  to improve (function): hip strength  [x]   Changed: Increased to BTB; increased to 5# with all exercises; increased reps per flowsheet to improve (function): strength    Patient Education: [x]  Review HEP    []  Progressed/Changed HEP based on:_   []  positioning   []  body mechanics   []  transfers   []  heat/ice application   (minutes) :    Other Objective/Functional Measures:  Pt reported challenge with progression of exercises but was able to complete all with proper technique.  LEFS score 40/80.    Pain Level (0-10 scale) post treatment: 0/10    ASSESSMENT  []   See Plan of Care  []   See progress note/recertification  [x]   Patient will continue to benefit from skilled therapy to address remaining functional deficits: decreased right hip strength.    Progress towards goals:  LEFS score increased 11 pts, showing increased function.  Pt able to perform 15 reps of sidelying Hip ABD with 5# but was fatigued by end of reps.      PLAN  []   Upgrade activities as tolerated     [x]   Continue plan of care  []   Discharge due to:_  []  Other:_        Shatha Hooser, PT       08/03/2011        8:46 AM

## 2011-08-05 NOTE — Progress Notes (Signed)
PT DAILY TREATMENT NOTE     Patient Name: [name] [name]  Date:08/05/2011  DOB: Jun 06, 1946  [x]   Patient DOB Verified  Payor: BLUE CROSS  Plan: [facility] BLUE [facility] [place] [facility]  Product Type: PPO     In time:9:00  Out time:9:40  Total Treatment Time (min): 40  Total Timed Codes (min): 40  1:1 Treatment Time (MC only): N/A   Visit #: 13 of 18    Treatment Area: Right THR    SUBJECTIVE  Pain Level (0-10 scale): 0/10  Any medication changes, allergies to medications, adverse drug reactions, diagnosis change, or new procedure performed?: [x]  No    []  Yes (see summary sheet for update)  Subjective functional status/changes:   [x]  No changes reported  "Just  stiffness."    OBJECTIVE    Therapeutic Exercise: [x]  see flow sheet     []  Other:_ (minutes) :40  Added/Changed Exercises:  []   Added: to improve (function):   []   Changed:  per flowsheet to improve (function):     Patient Education: [x]  Review HEP    []  Progressed/Changed HEP based on:_   []  positioning   []  body mechanics   []  transfers   []  heat/ice application   (minutes) :    Other Objective/Functional Measures:  Pt. Did not report difficulty with ex. Today.  Strength continues to improve as demonstrated with ex.    Pain Level (0-10 scale) post treatment: 0/10    ASSESSMENT  []   See Plan of Care  []   See progress note/recertification  [x]   Patient will continue to benefit from skilled therapy to address remaining functional deficits: decreased right hip strength.    Progress towards goals:  Con't to increase strength.      PLAN  []   Upgrade activities as tolerated     [x]   Continue plan of care  []   Discharge due to:_  []  Other:_        Minus Breeding, PTA       08/05/2011        8:46 AM

## 2011-08-08 NOTE — Progress Notes (Signed)
PT DAILY TREATMENT NOTE     Patient Name: [name] [name]  Date:08/08/2011  DOB: 05-23-1946  [x]   Patient DOB Verified  Payor: BLUE CROSS  Plan: [facility] BLUE CROSS OF [place] PPO  Product Type: PPO     In time:8:30  Out time:9:20  Total Treatment Time (min): 50  Total Timed Codes (min): 50  1:1 Treatment Time (MC only): N/A   Visit #: 14 of 18    Treatment Area: Right THR    SUBJECTIVE  Pain Level (0-10 scale): 0/10  Any medication changes, allergies to medications, adverse drug reactions, diagnosis change, or new procedure performed?: [x]  No    []  Yes (see summary sheet for update)  Subjective functional status/changes:   [x]  No changes reported  "Just  stiffness."    OBJECTIVE    Therapeutic Exercise: [x]  see flow sheet     []  Other:_ (minutes) :50  Added/Changed Exercises:  []   Added: to improve (function):   []   Changed:  per flowsheet to improve (function):     Patient Education: [x]  Review HEP    []  Progressed/Changed HEP based on:_   []  positioning   []  body mechanics   []  transfers   []  heat/ice application   (minutes) :    Other Objective/Functional Measures:  Pt. Did not report difficulty with ex. Today.  Strength continues to improve as demonstrated with ex.    Pain Level (0-10 scale) post treatment: 0/10    ASSESSMENT  []   See Plan of Care  []   See progress note/recertification  [x]   Patient will continue to benefit from skilled therapy to address remaining functional deficits: decreased right hip strength.    Progress towards goals:  Con't to increase strength.      PLAN  []   Upgrade activities as tolerated     [x]   Continue plan of care  []   Discharge due to:_  []  Other:_        Minus Breeding, PTA       08/08/2011        8:46 AM

## 2011-08-10 NOTE — Progress Notes (Signed)
PT DAILY TREATMENT NOTE     Patient Name: [name] [name]  Date:08/10/2011  DOB: Apr 17, 1946  [x]   Patient DOB Verified  Payor: BLUE CROSS  Plan: [facility] BLUE CROSS OF [place] P[facility]  Product Type: PPO     In time:8:30  Out time:9:05  Total Treatment Time (min): 35  Total Timed Codes (min): 35  1:1 Treatment Time (MC only): N/A   Visit #: 15 of 18    Treatment Area: Right THR    SUBJECTIVE  Pain Level (0-10 scale): 0/10  Any medication changes, allergies to medications, adverse drug reactions, diagnosis change, or new procedure performed?: [x]  No    []  Yes (see summary sheet for update)  Subjective functional status/changes:   [x]  No changes reported  "Just  stiffness."    OBJECTIVE    Therapeutic Exercise: [x]  see flow sheet     []  Other:_ (minutes) :35  Added/Changed Exercises:  []   Added: to improve (function):   []   Changed:  per flowsheet to improve (function):     Patient Education: [x]  Review HEP    []  Progressed/Changed HEP based on:_   []  positioning   []  body mechanics   []  transfers   []  heat/ice application   (minutes) :    Other Objective/Functional Measures:  Pt. Did not report difficulty with ex. Today.  Strength continues to improve as demonstrated with ex.    Pain Level (0-10 scale) post treatment: 0/10    ASSESSMENT  []   See Plan of Care  []   See progress note/recertification  [x]   Patient will continue to benefit from skilled therapy to address remaining functional deficits: decreased right hip strength.    Progress towards goals:  Con't to increase strength.      PLAN  []   Upgrade activities as tolerated     [x]   Continue plan of care  []   Discharge due to:_  []  Other:_        Minus Breeding, PTA       08/10/2011        8:46 AM

## 2011-08-12 NOTE — Progress Notes (Signed)
PT DAILY TREATMENT NOTE     Patient Name: [name] [name]  Date:08/12/2011  DOB: 02-12-1946  [x]   Patient DOB Verified  Payor: BLUE CROSS  Plan: [facility] BLUE CROSS OF [place] PPO  Product Type: PPO     In time:9:00  Out time:9:35  Total Treatment Time (min): 35  Total Timed Codes (min): 35  1:1 Treatment Time (MC only): N/A   Visit #: 3 of 18    Treatment Area: Right Hip Replacement    SUBJECTIVE  Pain Level (0-10 scale): 0/10  Any medication changes, allergies to medications, adverse drug reactions, diagnosis change, or new procedure performed?: [x]  No    []  Yes (see summary sheet for update)  Subjective functional status/changes:   []  No changes reported  "There's no pain, just stiffness, but I don't know if that will ever go away. It's usually stiff after I've been standing on it for awhile."    OBJECTIVE    Therapeutic Exercise: [x]  see flow sheet     []  Other:_ (minutes) :35  Added/Changed Exercises:  []   Added:_  to improve (function):  [x]   Changed:Increased Bike to level 4; Changed to [facility] TB for minisquats, sidesteps, hip abduction, increased reps for all other exercises, as noted on flow sheet  to improve (function): strength and endurance.    Patient Education: [x]  Review HEP    []  Progressed/Changed HEP based on:_   []  positioning   []  body mechanics   []  transfers   []  heat/ice application   (minutes) :    Other Objective/Functional Measures: Pt stated that the increased reps and TB resistance was challenging but didn't cause pain.  Pt completed all increased exercises with no substitutions.   Pain Level (0-10 scale) post treatment: 0/10    ASSESSMENT  []   See Plan of Care  []   See progress note/recertification  [x]   Patient will continue to benefit from skilled therapy to address remaining functional deficits: decreased ROM, strength; increased pain.    Progress towards goals: Pt is progressing as noted with increased reps and resistance.  Pt demonstrated ability to ascend/descend stairs reciprocally.        PLAN  []   Upgrade activities as tolerated     [x]   Continue plan of care  []   Discharge due to:_  []  Other:_        Urie Loughner, PT       08/12/2011        9:05 AM

## 2011-08-15 NOTE — Progress Notes (Signed)
PT DAILY TREATMENT NOTE     Patient Name: [name] [name]  Date:08/15/2011  DOB: 1946-01-25  [x]   Patient DOB Verified  Payor: BLUE CROSS  Plan: [facility] BLUE CROSS [facility] [place] PPO  Product Type: PPO     In time:8:30  Out time:9:15  Total Treatment Time (min): 45  Total Timed Codes (min): 45  1:1 Treatment Time (MC only): N/A   Visit #: 4 of 18    Treatment Area: Right Hip Replacement    SUBJECTIVE  Pain Level (0-10 scale): 0/10  Any medication changes, allergies to medications, adverse drug reactions, diagnosis change, or new procedure performed?: [x]  No    []  Yes (see summary sheet for update)  Subjective functional status/changes:   []  No changes reported  "There's no pain, just stiffness."  OBJECTIVE    Therapeutic Exercise: [x]  see flow sheet     []  Other:_ (minutes) :45  Added/Changed Exercises:  []   Added:_  to improve (function):  []   Changed:    to improve (function):     Patient Education: [x]  Review HEP    []  Progressed/Changed HEP based on:_   []  positioning   []  body mechanics   []  transfers   []  heat/ice application   (minutes) :    Other Objective/Functional Measures: Pt reported no pain after therapy and says stiffness was getting better.  Pain Level (0-10 scale) post treatment: 0/10    ASSESSMENT  []   See Plan of Care  []   See progress note/recertification  [x]   Patient will continue to benefit from skilled therapy to address remaining functional deficits: decreased ROM, strength; increased pain.    Progress towards goals: Pt is progressing as noted with increased reps and resistance.  Pt demonstrated ability to ascend/descend stairs reciprocally.       PLAN  []   Upgrade activities as tolerated     [x]   Continue plan of care  []   Discharge due to:_  []  Other:_        Minus Breeding, PTA       08/15/2011        9:05 AM

## 2011-08-17 NOTE — Progress Notes (Signed)
PT DAILY TREATMENT NOTE     Patient Name: [name] [name]  Date:08/17/2011  DOB: 1946/06/16  [x]   Patient DOB Verified  Payor: BLUE CROSS  Plan: [facility] [place] [facility]  Product Type: PPO     In time:8:30  Out time:9:00  Total Treatment Time (min): 30  Total Timed Codes (min): 30  1:1 Treatment Time (MC only): N/A   Visit #: 5 of 18    Treatment Area: Right Hip Replacement    SUBJECTIVE  Pain Level (0-10 scale): 0/10  Any medication changes, allergies to medications, adverse drug reactions, diagnosis change, or new procedure performed?: [x]  No    []  Yes (see summary sheet for update)  Subjective functional status/changes:   []  No changes reported  "Still some stiffness, nothing I can't deal with."  OBJECTIVE    Therapeutic Exercise: [x]  see flow sheet     []  Other:_ (minutes) :30  Added/Changed Exercises:  []   Added:_  to improve (function):  []   Changed:    to improve (function):     Patient Education: [x]  Review HEP    []  Progressed/Changed HEP based on:_   []  positioning   []  body mechanics   []  transfers   []  heat/ice application   (minutes) :    Other Objective/Functional Measures: Good performance of exercise today, pt report no difficulty.  Pain Level (0-10 scale) post treatment: 0/10    ASSESSMENT  []   See Plan of Care  []   See progress note/recertification  [x]   Patient will continue to benefit from skilled therapy to address remaining functional deficits: decreased ROM, strength; increased pain.    Progress towards goals: Pt is progressing towards increased strength and decreased stiffness.       PLAN  []   Upgrade activities as tolerated     [x]   Continue plan of care  []   Discharge due to:_  []  Other:_        Minus Breeding, PTA       08/17/2011        9:05 AM

## 2011-08-24 NOTE — Progress Notes (Signed)
[facility]  [address]  [place], [place]  [address]  Phone: [phone]      Fax:  [phone]    Discharge Summary    Name: [name] [name]   DOB: August 17, 1946   MD: Gifford Shave, MD       Treatment Diagnosis: Pain in joint, pelvic region and thigh [719.45]   Start of Care: 06/09/11    Visits from Start of Care: 19  Missed Visits: 1    Summary of Care: Pt progressed very well with therapy.  Pt is able to ambulate up/down stairs with reciprocal gait pattern with 1 UE support and no pain.  Pt exhibits 5/5 right LE strength all planes without pain.  Pt is able to perform sidelying right Hip ABD SLRs with 5# weights x 25 reps with ease.  Pt reports some soreness in right hip in the morning but has returned to PLOF without difficulty.      Assessment / Recommendations:   Discontinue therapy. Progressing towards or have reached established goals.    Nuchem Grattan, PT 08/24/2011 9:16 AM    ________________________________________________________________________  NOTE TO PHYSICIAN:  Please complete the following and fax to:   [facility] at [phone]  . Retain this original for your records.  If you are unable to process this request in 24 hours, please contact our office.       ____ I have read the above report and request that my patient continue therapy with the following changes/special instructions:  ____ I have read the above report and request that my patient be discharged from therapy    Physician's Signature:_________________ Date:___________Time:__________

## 2011-08-24 NOTE — Progress Notes (Signed)
PT DAILY TREATMENT NOTE     Patient Name: [name] [name]  Date:08/24/2011  DOB: [birth year]  [x]   Patient DOB Verified  Payor: BLUE CROSS  Plan: [place] OF  PPO  Product Type: PPO     In time:8:30  Out time:9:02  Total Treatment Time (min): 32  Total Timed Codes (min): 32  1:1 Treatment Time (MC only): N/A   Visit #: 6 of 18    Treatment Area: Right THR    SUBJECTIVE  Pain Level (0-10 scale): 0/10  Any medication changes, allergies to medications, adverse drug reactions, diagnosis change, or new procedure performed?: [x]  No    []  Yes (see summary sheet for update)  Subjective functional status/changes:   []  No changes reported  "I'm ready.  I feel good.  I get the other one done in two weeks."    OBJECTIVE    Therapeutic Exercise: [x]  see flow sheet     []  Other:_ (minutes) : 32  Added/Changed Exercises:  []   Added:_  to improve (function):  []   Changed:_ to improve (function):    Patient Education: [x]  Review HEP    []  Progressed/Changed HEP based on:_   []  positioning   []  body mechanics   []  transfers   []  heat/ice application   (minutes) :    Other Objective/Functional Measures:  LEFS score 45/80.  Pt exhibited 5/5 right LE strength in Hip ABD, IR, ER.  Pt is able to ambulate up/down stairs with reciprocal gait pattern without pain and using only 1 UE.    Pain Level (0-10 scale) post treatment: 0/10    ASSESSMENT  []   See Plan of Care  []   See progress note/recertification  [x]   Patient will continue to benefit from skilled therapy to address remaining functional deficits: right hip soreness with movement.    Progress towards goals: Pt is able to perform sidelying right Hip ABD SLRs with 5# x 25 reps.  Pt has met all goals and is independent with HEP.  Pt can be D/C.      PLAN  []   Upgrade activities as tolerated     []   Continue plan of care  [x]   Discharge due to: Program complete and goals met.  []  Other:_        [name] [name], PT       08/24/2011        9:09 AM

## 2011-08-30 LAB — TYPE AND SCREEN
ABO/Rh: A POS
Antibody Screen: NEGATIVE

## 2011-08-30 LAB — METABOLIC PANEL, BASIC
Anion gap: 3 mmol/L — ABNORMAL LOW (ref 5–15)
BUN/Creatinine ratio: 9 — ABNORMAL LOW (ref 12–20)
BUN: 10 MG/DL (ref 7–18)
CO2: 31 MMOL/L (ref 21–32)
Calcium: 9.1 MG/DL (ref 8.4–10.4)
Chloride: 104 MMOL/L (ref 100–108)
Creatinine: 1.1 MG/DL (ref 0.6–1.3)
GFR est AA: >60 mL/min/{1.73_m2} (ref >60)
GFR est non-AA: >60 mL/min/{1.73_m2} (ref >60)
Glucose: 90 MG/DL (ref 74–99)
Potassium: 5.5 MMOL/L (ref 3.5–5.5)
Sodium: 138 MMOL/L (ref 136–145)

## 2011-08-30 LAB — CBC W/O DIFF
HCT: 48.1 % — ABNORMAL HIGH (ref 36.0–48.0)
HGB: 16.1 g/dL — ABNORMAL HIGH (ref 13.0–16.0)
MCH: 32.3 PG (ref 24.0–34.0)
MCHC: 33.5 g/dL (ref 31.0–37.0)
MCV: 96.4 FL (ref 74.0–97.0)
MPV: 10.4 FL (ref 9.2–11.8)
PLATELET: 361 10*3/uL (ref 135–420)
RBC: 4.99 M/uL (ref 4.70–5.50)
RDW: 13.5 % (ref 11.6–14.5)
WBC: 6.6 10*3/uL (ref 4.6–13.2)

## 2011-08-30 LAB — TYPE & SCREEN
ABO/Rh(D): A POS
Antibody screen: NEGATIVE

## 2011-08-30 LAB — PTT: aPTT: 29.8 s (ref 24.6–37.7)

## 2011-08-30 LAB — PROTHROMBIN TIME + INR
INR: 0.9 (ref 0.0–1.2)
Prothrombin time: 12.3 s (ref 11.5–15.2)

## 2011-08-30 LAB — HEMOGLOBIN A1C WITH EAG: Hemoglobin A1c: 5.3 % (ref 4.5–6.2)

## 2011-08-30 LAB — ALBUMIN: Albumin: 3.7 g/dL (ref 3.4–5.0)

## 2011-08-31 LAB — URINALYSIS W/ RFLX MICROSCOPIC
Bilirubin: NEGATIVE
Blood: NEGATIVE
Glucose: NEGATIVE MG/DL
Leukocyte Esterase: NEGATIVE
Nitrites: NEGATIVE
Protein: NEGATIVE MG/DL
Specific gravity: 1.015 (ref 1.003–1.030)
Urobilinogen: 0.2 EU/DL (ref 0.2–1.0)
pH (UA): 6.5 (ref 5.0–8.0)

## 2011-09-02 LAB — CULTURE, URINE
Culture result:: 40000
Culture: 40000

## 2011-09-10 NOTE — H&P (Signed)
[facility] TOTAL HIP ARTHROPLASTY   HISTORY AND PHYSICAL      Patient: [name] [name]  MRN: [id number]  SSN: [id number]   Date of Birth: [birth year]  Age: 65 y.o.  Sex: male     Patient scheduled for: Left Total Hip Arthroplasty  Date of surgery: 09/13/11  Location of surgery: [facility]  Surgeon: Paris Lore, MD      The patient is a pleasant 65 year old male who has a longstanding history of left hip pain and has failed conservative measures. Therefore, surgical intervention is indicated.  The alternatives, risks, and complications, including but not limited to infection, blood loss, need for blood transfusion, neurovascular damage, bone fracture, anesthetic complications, DVT, PE, postoperative stiffness and pain, prosthesis longevity, leg length discrepancy, dislocation, need for more surgery, MI, and stroke have been discussed. The patient understands and wishes to proceed with surgery.      Past Medical History   Diagnosis Date   ??? COPD    ??? Unspecified adverse effect of anesthesia      WHEN SPINAL WORE OFF, WAS IN EXCRUITIATING PAIN        HEPATITIS      Past Surgical History   Procedure Date   ??? Hx hip replacement 04/2011     RIGHT     No Known Allergies  PERCOCET INTOLERANCE     No current facility-administered medications for this encounter.     Current Outpatient Prescriptions   Medication Sig Dispense Refill   ??? tiotropium (SPIRIVA WITH HANDIHALER) 18 mcg inhalation capsule Take 1 Cap by inhalation daily.    Indications: CHRONIC OBSTRUCTIVE PULMONARY DISEASE WITH BRONCHOSPASMS       ??? carBAMazepine XR (TEGRETOL XR) 200 mg SR tablet Take 200 mg by mouth two (2) times a day.         ??? albuterol (PROVENTIL HFA, VENTOLIN HFA) 90 mcg/actuation inhaler Take 2 Puffs by inhalation every four (4) hours as needed.    Indications: CHRONIC OBSTRUCTIVE PULMONARY DISEASE            ASPRIN 81 mg DAILY *ON HOLD*      PHYSICAL EXAMINATION:     Ht 5\' 10"  (1.778 m)   Wt 72.576 kg (160 lb)   BMI 22.96 kg/m2  Gen: BP: 140/70, Pulse 78. Well developed, well nourished 65 y.o. male in no acute distress  Resp: clear to auscultation bilaterally.  Cardio: Normal S1 and S2 with regular rate and rhythm  Abdomen: soft, nontender, nondistended, normoactive bowel sounds in all four quadrants  Extremities: positive distal pulses, 2+ dorsalis pedis and posterior tibial. Examination of leg lengths reveal symmetrical lengths of bilateral lower extremity grossly sitting in the chair, which was explained to the patient.  Motor: Intact  Neuro: sensation intact to light touch and strength grossly intact and symmetrical  Psych: alert and oriented to person, place and time    RADIOGRAPHS:    Radiographs obtained in the office today, including AP pelvis, AP left hip, and cross table lateral of the left hip, reveal severe arthritis.    LABS:    Preoperative labs were reviewed and the patient will be required to obtain cardiac clearance prior to surgery. Urinalysis is negative.    ASSESSMENT:    Left hip severe arthritis.    PLAN:  At this point we will move forward with a left total hip arthroplasty once cardiac clearance has been obtained.  Again, the alternatives, risks, and complications, as well  as expected outcomes were discussed. The patient understands and wishes to proceed with surgery.    Welford Christmas A. Pleas Patricia, PA-C

## 2011-09-13 ENCOUNTER — Inpatient Hospital Stay
Admit: 2011-09-13 | Discharge: 2011-09-15 | Disposition: A | Discharge status: Home Health | Payer: BLUE CROSS/BLUE SHIELD | Attending: Orthopaedic Surgery | Admitting: Orthopaedic Surgery

## 2011-09-13 DIAGNOSIS — M1610 Unilateral primary osteoarthritis, unspecified hip: Secondary | ICD-10-CM

## 2011-09-13 LAB — URINALYSIS W/MICROSCOPIC
Bilirubin: NEGATIVE
Glucose: NEGATIVE MG/DL
Ketone: NEGATIVE MG/DL
Leukocyte Esterase: NEGATIVE
Nitrites: NEGATIVE
Protein: NEGATIVE MG/DL
RBC: 0 /HPF (ref 0–5)
Specific gravity: >1.03 — ABNORMAL HIGH (ref 1.003–1.030)
Urobilinogen: 0.2 EU/DL (ref 0.2–1.0)
pH (UA): 5 (ref 5.0–8.0)

## 2011-09-13 LAB — CBC W/O DIFF
HCT: 40.5 % (ref 36.0–48.0)
HGB: 13.4 g/dL (ref 13.0–16.0)
MCH: 32.1 PG (ref 24.0–34.0)
MCHC: 33.1 g/dL (ref 31.0–37.0)
MCV: 96.9 FL (ref 74.0–97.0)
MPV: 9.6 FL (ref 9.2–11.8)
PLATELET: 273 10*3/uL (ref 135–420)
RBC: 4.18 M/uL — ABNORMAL LOW (ref 4.70–5.50)
RDW: 13.3 % (ref 11.6–14.5)
WBC: 20.3 10*3/uL — ABNORMAL HIGH (ref 4.6–13.2)

## 2011-09-13 MED ORDER — FAMOTIDINE 20 MG TAB
20 mg | Freq: Once | ORAL | Status: AC
Start: 2011-09-13 — End: 2011-09-13
  Administered 2011-09-13: 10:00:00 via ORAL

## 2011-09-13 MED ORDER — ZOLPIDEM 5 MG TAB
5 mg | Freq: Every evening | ORAL | Status: DC | PRN
Start: 2011-09-13 — End: 2011-09-15

## 2011-09-13 MED ORDER — OXYCODONE-ACETAMINOPHEN 7.5 MG-325 MG TAB
ORAL | Status: DC | PRN
Start: 2011-09-13 — End: 2011-09-15
  Administered 2011-09-13 – 2011-09-15 (×5): via ORAL

## 2011-09-13 MED ORDER — HYDROMORPHONE 2 MG/ML INJECTION SOLUTION
2 mg/mL | INTRAMUSCULAR | Status: AC
Start: 2011-09-13 — End: ?

## 2011-09-13 MED ORDER — PREGABALIN 50 MG CAP
50 mg | Freq: Once | ORAL | Status: DC | PRN
Start: 2011-09-13 — End: 2011-09-13
  Administered 2011-09-13: 10:00:00 via ORAL

## 2011-09-13 MED ORDER — OXYCODONE CR 20 MG 12 HR TAB
20 mg | Freq: Two times a day (BID) | ORAL | Status: DC
Start: 2011-09-13 — End: 2011-09-15
  Administered 2011-09-13 – 2011-09-15 (×5): via ORAL

## 2011-09-13 MED ORDER — CELECOXIB 400 MG CAP
400 mg | Freq: Once | ORAL | Status: DC | PRN
Start: 2011-09-13 — End: 2011-09-13

## 2011-09-13 MED ORDER — ALBUTEROL SULFATE HFA 90 MCG/ACTUATION AEROSOL INHALER
90 mcg/actuation | RESPIRATORY_TRACT | Status: DC | PRN
Start: 2011-09-13 — End: 2011-09-15

## 2011-09-13 MED ORDER — CEFAZOLIN 1 GRAM SOLUTION FOR INJECTION
1 gram | INTRAMUSCULAR | Status: AC
Start: 2011-09-13 — End: 2011-09-13
  Administered 2011-09-13: 11:00:00 via INTRAVENOUS

## 2011-09-13 MED ORDER — SODIUM CHLORIDE 0.9 % INJECTION
INTRAMUSCULAR | Status: AC
Start: 2011-09-13 — End: ?

## 2011-09-13 MED ORDER — SODIUM CHLORIDE 0.9 % IRRIGATION SOLN
0.9 % | Status: DC | PRN
Start: 2011-09-13 — End: 2011-09-13
  Administered 2011-09-13: 12:00:00

## 2011-09-13 MED ORDER — MIDAZOLAM 1 MG/ML IJ SOLN
1 mg/mL | INTRAMUSCULAR | Status: AC
Start: 2011-09-13 — End: ?

## 2011-09-13 MED ORDER — PROCHLORPERAZINE MALEATE 5 MG TAB
5 mg | Freq: Three times a day (TID) | ORAL | Status: DC | PRN
Start: 2011-09-13 — End: 2011-09-15

## 2011-09-13 MED ORDER — DIPHENHYDRAMINE HCL 50 MG/ML IJ SOLN
50 mg/mL | Freq: Four times a day (QID) | INTRAMUSCULAR | Status: DC | PRN
Start: 2011-09-13 — End: 2011-09-15

## 2011-09-13 MED ORDER — ONDANSETRON (PF) 4 MG/2 ML INJECTION
4 mg/2 mL | INTRAMUSCULAR | Status: DC | PRN
Start: 2011-09-13 — End: 2011-09-15

## 2011-09-13 MED ORDER — MIDAZOLAM 1 MG/ML IJ SOLN
1 mg/mL | Freq: Once | INTRAMUSCULAR | Status: DC
Start: 2011-09-13 — End: 2011-09-13

## 2011-09-13 MED ORDER — TIOTROPIUM BROMIDE 18 MCG CAPS WITH INHALATION DEVICE
18 mcg | Freq: Every day | RESPIRATORY_TRACT | Status: DC
Start: 2011-09-13 — End: 2011-09-15
  Administered 2011-09-13 – 2011-09-15 (×3): via RESPIRATORY_TRACT

## 2011-09-13 MED ORDER — CELECOXIB 100 MG CAP
100 mg | Freq: Two times a day (BID) | ORAL | Status: DC
Start: 2011-09-13 — End: 2011-09-15
  Administered 2011-09-14 – 2011-09-15 (×4): via ORAL

## 2011-09-13 MED ORDER — CARBAMAZEPINE SR 200 MG 12 HR TAB
200 mg | Freq: Two times a day (BID) | ORAL | Status: DC
Start: 2011-09-13 — End: 2011-09-15
  Administered 2011-09-13 – 2011-09-15 (×4): via ORAL

## 2011-09-13 MED ORDER — FENTANYL CITRATE (PF) 50 MCG/ML IJ SOLN
50 mcg/mL | Freq: Once | INTRAMUSCULAR | Status: DC
Start: 2011-09-13 — End: 2011-09-13

## 2011-09-13 MED ORDER — PREGABALIN 75 MG CAP
75 mg | Freq: Two times a day (BID) | ORAL | Status: DC
Start: 2011-09-13 — End: 2011-09-15
  Administered 2011-09-14 – 2011-09-15 (×4): via ORAL

## 2011-09-13 MED ORDER — EPINEPHRINE (PF) 1 MG/ML INJECTION
1 mg/mL ( mL) | Freq: Once | INTRAMUSCULAR | Status: DC
Start: 2011-09-13 — End: 2011-09-13

## 2011-09-13 MED ORDER — DEXTROSE 5%-LACTATED RINGERS IV
INTRAVENOUS | Status: DC
Start: 2011-09-13 — End: 2011-09-13

## 2011-09-13 MED ORDER — SODIUM CHLORIDE 0.9 % IV
INTRAVENOUS | Status: AC
Start: 2011-09-13 — End: 2011-09-14
  Administered 2011-09-13 – 2011-09-14 (×2): via INTRAVENOUS

## 2011-09-13 MED ORDER — ACETAMINOPHEN 325 MG TABLET
325 mg | Freq: Once | ORAL | Status: DC
Start: 2011-09-13 — End: 2011-09-13

## 2011-09-13 MED ORDER — BACITRACIN 50,000 UNIT IM
50000 unit | INTRAMUSCULAR | Status: DC | PRN
Start: 2011-09-13 — End: 2011-09-13
  Administered 2011-09-13: 12:00:00

## 2011-09-13 MED ORDER — HYDROMORPHONE (PF) 1 MG/ML IJ SOLN
1 mg/mL | INTRAMUSCULAR | Status: DC | PRN
Start: 2011-09-13 — End: 2011-09-15

## 2011-09-13 MED ORDER — NICOTINE 21 MG/24 HR DAILY PATCH
21 mg/24 hr | Freq: Every day | TRANSDERMAL | Status: DC
Start: 2011-09-13 — End: 2011-09-15

## 2011-09-13 MED ORDER — MAGNESIUM HYDROXIDE 400 MG/5 ML ORAL SUSP
400 mg/5 mL | Freq: Every day | ORAL | Status: DC | PRN
Start: 2011-09-13 — End: 2011-09-15

## 2011-09-13 MED ORDER — FERROUS SULFATE 325 MG (65 MG ELEMENTAL IRON) TAB
325 mg (65 mg iron) | Freq: Two times a day (BID) | ORAL | Status: DC
Start: 2011-09-13 — End: 2011-09-15
  Administered 2011-09-13 – 2011-09-15 (×4): via ORAL

## 2011-09-13 MED ORDER — FENTANYL CITRATE (PF) 50 MCG/ML IJ SOLN
50 mcg/mL | INTRAMUSCULAR | Status: AC
Start: 2011-09-13 — End: ?

## 2011-09-13 MED ORDER — DOCUSATE SODIUM 100 MG CAP
100 mg | Freq: Two times a day (BID) | ORAL | Status: DC
Start: 2011-09-13 — End: 2011-09-15
  Administered 2011-09-13 – 2011-09-15 (×5): via ORAL

## 2011-09-13 MED ORDER — LACTATED RINGERS IV
INTRAVENOUS | Status: DC
Start: 2011-09-13 — End: 2011-09-13
  Administered 2011-09-13: 15:00:00 via INTRAVENOUS

## 2011-09-13 MED ORDER — NICOTINE 14 MG/24 HR DAILY PATCH
14 mg/24 hr | TRANSDERMAL | Status: DC
Start: 2011-09-13 — End: 2011-09-13

## 2011-09-13 MED ORDER — SODIUM CHLORIDE 0.9 % IV
INTRAVENOUS | Status: AC
Start: 2011-09-13 — End: ?

## 2011-09-13 MED ORDER — WARFARIN 10 MG TAB
10 mg | Freq: Once | ORAL | Status: AC
Start: 2011-09-13 — End: 2011-09-13
  Administered 2011-09-13: 11:00:00 via ORAL

## 2011-09-13 MED ORDER — CELECOXIB 400 MG CAP
400 mg | Freq: Once | ORAL | Status: AC
Start: 2011-09-13 — End: 2011-09-13
  Administered 2011-09-13: 10:00:00 via ORAL

## 2011-09-13 MED ORDER — CEFAZOLIN 2 G IN 100 ML 0.9% NS
2 gram/100 mL | Freq: Three times a day (TID) | INTRAVENOUS | Status: AC
Start: 2011-09-13 — End: 2011-09-14
  Administered 2011-09-13 – 2011-09-14 (×3): via INTRAVENOUS

## 2011-09-13 MED ORDER — BACITRACIN 50,000 UNIT IM
50000 unit | INTRAMUSCULAR | Status: AC
Start: 2011-09-13 — End: ?

## 2011-09-13 MED ORDER — HYDROMORPHONE 2 MG/ML INJECTION SOLUTION
2 mg/mL | INTRAMUSCULAR | Status: DC | PRN
Start: 2011-09-13 — End: 2011-09-13
  Administered 2011-09-13 (×6): via INTRAVENOUS

## 2011-09-13 MED ORDER — ACETAMINOPHEN 325 MG TABLET
325 mg | ORAL | Status: DC | PRN
Start: 2011-09-13 — End: 2011-09-15

## 2011-09-13 MED ORDER — ALBUTEROL SULFATE 0.083 % (0.83 MG/ML) SOLN FOR INHALATION
2.5 mg /3 mL (0.083 %) | RESPIRATORY_TRACT | Status: DC | PRN
Start: 2011-09-13 — End: 2011-09-15

## 2011-09-13 MED ORDER — BISACODYL 5 MG TAB, DELAYED RELEASE
5 mg | Freq: Every day | ORAL | Status: DC | PRN
Start: 2011-09-13 — End: 2011-09-15

## 2011-09-13 MED ORDER — LACTATED RINGERS IV
INTRAVENOUS | Status: DC
Start: 2011-09-13 — End: 2011-09-13
  Administered 2011-09-13: 10:00:00 via INTRAVENOUS

## 2011-09-13 MED ORDER — SODIUM CHLORIDE 0.9 % IV PIGGY BACK
1 gram | Freq: Once | INTRAVENOUS | Status: AC
Start: 2011-09-13 — End: 2011-09-13
  Administered 2011-09-13: 11:00:00 via INTRAVENOUS

## 2011-09-13 MED ORDER — ONDANSETRON (PF) 4 MG/2 ML INJECTION
4 mg/2 mL | INTRAMUSCULAR | Status: AC
Start: 2011-09-13 — End: ?

## 2011-09-13 MED ORDER — NALOXONE 0.4 MG/ML INJECTION
0.4 mg/mL | INTRAMUSCULAR | Status: DC | PRN
Start: 2011-09-13 — End: 2011-09-15

## 2011-09-13 MED ORDER — HYDROMORPHONE (PF) 2 MG/ML IJ SOLN
2 mg/mL | INTRAMUSCULAR | Status: AC
Start: 2011-09-13 — End: 2011-09-13

## 2011-09-13 MED ORDER — OXYCODONE CR 10 MG 12 HR TAB
10 mg | Freq: Once | ORAL | Status: AC
Start: 2011-09-13 — End: 2011-09-13
  Administered 2011-09-13: 10:00:00 via ORAL

## 2011-09-13 NOTE — Other (Signed)
TRANSFER - OUT REPORT:    Verbal report given to Aram Beecham RN on [name] [name]  being transferred to [facility] [place] for routine post - op       Report consisted of patient???s Situation, Background, Assessment and   Recommendations(SBAR).     Information from the following report(s) SBAR, Procedure Summary, Intake/Output and MAR was reviewed with the receiving nurse.    Opportunity for questions and clarification was provided.

## 2011-09-13 NOTE — Progress Notes (Signed)
+  Post-Anesthesia Evaluation and Assessment    Patient: [name] [name] MRN: [id number]  SSN: [id number]   Date of Birth: 1945/11/29  Age: 65 y.o.  Sex: male      Cardiovascular Function/Vital Signs  BP 105/55    Pulse 61   Temp(36.4 ??C)   Resp 15   Ht 5\' 10"  (1.778 m)   Wt 72.292 kg (159 lb 6 oz)   BMI 22.87 kg/m2   SpO2 100%    Patient is status post Procedure(s) with comments:  HIP ARTHROPLASTY TOTAL ANTERIOR APPROACH - LEFT HIP ARTHROPLASTY TOTAL  DIRECT ANTERIOR APPROACH.    Nausea/Vomiting: Controlled.    Postoperative hydration reviewed and adequate.    Pain:  Pain Scale 1: Numeric (0 - 10) (09/13/11 6440)  Pain Intensity 1: 0 (09/13/11 3474)   Managed.    Neurological Status:   Neuro (WDL): Within Defined Limits (09/13/11 0600)   At baseline.    Mental Status and Level of Consciousness: Arousable.    Pulmonary Status:   O2 Device: face mask  Adequate oxygenation and airway patent.    Complications related to anesthesia: None    Post-anesthesia assessment completed. No concerns.    Miquel Dunn, CRNA   September 13, 2011

## 2011-09-13 NOTE — Brief Op Note (Signed)
BRIEF OPERATIVE NOTE    Date of Procedure: 09/13/2011   Preoperative Diagnosis: END STAGE OSTEOARTHRITIS LEFT HIP  Postoperative Diagnosis: END STAGE OSTEOARTHRITIS LEFT HIP    Procedure: Procedure(s) with comments:  HIP ARTHROPLASTY TOTAL ANTERIOR APPROACH - LEFT HIP ARTHROPLASTY TOTAL  DIRECT ANTERIOR APPROACH  Surgeon(s) and Role:     * Gifford Shave, MD - Primary  Anesthesia: General   Estimated Blood Loss:  Specimens:   ID Type Source Tests Collected by Time Destination   1 : left femoral head Preservative Hip  Gifford Shave, MD 09/13/2011 [phone] Pathology      Findings: same   Complications: none  Implants:   Implant Name Type Inv. Item Serial No. Manufacturer Lot No. LRB No. Used Action   SCR BNE ACET CANC TRIDENT - [id number]  SCR BNE ACET CANC TRIDENT [id number] [employer] HOWM [id number] Left 1 Implanted   SCR BNE ACET CANC TRIDENT - [id number]  SCR BNE ACET CANC TRIDENT [id number] [employer] HOWM [id number] Left 1 Implanted   SHELL TRITAN HEMI CLSTR H 58 F - [id number]  SHELL TRITAN HEMI CLSTR H 58 F [id number] [employer] HOWM [id number] Left 1 Implanted   tridentx3 0degree polyethylene insert   [id number] [employer] [id number] Left 1 Implanted   accolade II 127 degree neck abgle hip stem   [id number] [employer] [id number] Left 1 Implanted   HEAD FEM DELT V40 -2.5MM - [id number]   HEAD FEM DELT V40 -2.5MM [id number] [employer] HOWM [id number] Left 1 Implanted

## 2011-09-13 NOTE — Op Note (Signed)
[facility]                               [address] [place], [place] [address]                                 OPERATIVE REPORT    PATIENT:     [name] [name], [name] [name]  MRN:            [id number]    DATE:  BILLING:     [id number]  ROOM:        PACUPACUPL  ATTENDING:   Gifford Shave, MD,FRCSC  DICTATING:   Gifford Shave, MD,FRCSC      PREOPERATIVE DIAGNOSIS: End-stage arthritis, left hip, with osteophytosis  inferiorly.    POSTOPERATIVE DIAGNOSIS: End-stage arthritis, left hip, with osteophytosis  inferiorly.    COMPLICATIONS: None.    FIRST ASSISTANT: Carin Hock    SECOND ASSISTANT: Theodoro Grist ________    ANESTHESIOLOGIST: Dr. Junius Finner    BLOOD LOSS: 275.    OPERATIVE PROCEDURE: Left total hip replacement using the Accolade system  with a size #5 high offset, 127 neck angle femoral component, Accolade 2  size 5, and 58 Tritanium cup, a neutral X3 36 liner and a 36 -2.5 Biolox  ceramic femoral head.    DESCRIPTION OF OPERATIVE PROCEDURE: After the anesthetic was successfully  induced, it was confirmed the patient did receive preoperative antibiotics  and Coumadin. Leg lengths determined. Time-out performed. We performed a  standard direct anterior approach.    We found the leash of vessels and we actually had a second branch which we  tied and used the Aquamantys on.    Released the lateralis fascia and the Gelpi was introduced, found the  lateral border of rectus and then resected the precapsular fat.    We then did our arthrotomy and repositioned our Hohmann's with the guide of  the C-arm, confirmed our neck osteotomy level. He had quite a thick  ligamentum teres which was divided and the head extracted.    We protected the musculature and neurovascular structures at all times.    Debrided the cup in the usual fashion, identified the medial wall through  the foveal notch. Released transverse acetabular ligament and with the aid   of C-arm control, we reamed up the cup, and went for a line-to-line ream,  had excellent fit and fill, trialed this and it was very stable with a 58  and augmented with a couple of screws.    Removed the posterior inferior osteophytes. Used the [employer] around the cup.    We then removed the posterior capsule.    I released the traction and did our standard release, and instrumented  around the femur, stayed lateral with a Le[place]ell rongeur in neutral  alignment and used the canal finder and with the C-arm as well, and then  broached up to accommodate the implant.    We were very tight. C-arm confirmed. We trialed this and it was between  the, I really liked the -2.5 with excellent restoration of leg lengths.  It was very stable. No impingement with external rotation.    Pulse lavaged out, implanted the definitive component, retrialed, and  I was  happiest with the -25. Cleaned the trunnion, impacted on, again reducing  the hip. Routine closure and clips for the skin. No complications.    BLOOD LOSS: 275.    The patient was brought to the recovery room and x-rays intraoperatively  look wonderful.                        Date:______Time:______Signature________________________________          Gifford Shave, MD,FRCSC    ALM:wmx  D: 09/13/2011 T: 09/13/2011 10:39 A  Job: [id number]  CScriptDoc #: [id number]    cc:     Gifford Shave, MD,FRCSC

## 2011-09-13 NOTE — Other (Signed)
Bedside and Verbal shift change report given to R.Valencia,RN (oncoming nurse) by Raelene Bott     (offgoing nurse).  Report given with SBAR, Kardex, Intake/Output and MAR.

## 2011-09-13 NOTE — Consults (Addendum)
[facility]  Pulmonary, Critical Care, and Sleep Medicine    Initial Patient Consult     Name: [name] [name] MRN: [id number]   DOB: 1946-01-16 Hospital: [facility]   Date: 09/13/2011        Subjective:     This patient has been seen and evaluated at the request of Dr. Meyer Cory for post-op medical management.    Patient is a 65 y.o. male with left THR today with EBL 275cc.  He has PMH of COPD and Seizures (35 yrs ago).  He still smokes about 1.5 PPD.  Patient awake, mild pain left hip.  No other complaints.  Patient COPD being managed by his primary care physician.  He is not on home O2.    Past Medical History   Diagnosis Date   ??? COPD    ??? Unspecified adverse effect of anesthesia      WHEN SPINAL WORE OFF, WAS IN EXCRUITIATING PAIN   ??? Seizure       Past Surgical History   Procedure Date   ??? Hx hip replacement 04/2011     RIGHT      Prior to Admission medications    Medication Sig Start Date End Date Taking? Authorizing Provider   tiotropium (SPIRIVA WITH HANDIHALER) 18 mcg inhalation capsule Take 1 Cap by inhalation daily.    Indications: CHRONIC OBSTRUCTIVE PULMONARY DISEASE WITH BRONCHOSPASMS   Yes Historical Provider   carBAMazepine XR (TEGRETOL XR) 200 mg SR tablet Take 200 mg by mouth two (2) times a day.     Yes Historical Provider   albuterol (PROVENTIL HFA, VENTOLIN HFA) 90 mcg/actuation inhaler Take 2 Puffs by inhalation every four (4) hours as needed.    Indications: CHRONIC OBSTRUCTIVE PULMONARY DISEASE   Yes Historical Provider     No Known Allergies   History   Substance Use Topics   ??? Smoking status: Current Everyday Smoker -- 1.0 to 1.5 packs/day   ??? Smokeless tobacco: Never Used   ??? Alcohol Use: Yes      OCCASIONALLY      No family history on file.     Current Facility-Administered Medications   Medication Dose Route Frequency   ??? warfarin (COUMADIN) tablet 10 mg  10 mg Oral ONCE   ??? celecoxib (CELEBREX) capsule 400 mg  400 mg Oral ONCE    ??? ceFAZolin (ANCEF) 1 g in 0.9% sodium chloride (MBP/ADV) 50 mL MBP  1 g IntraVENous ON CALL TO OR   ??? famotidine (PEPCID) tablet 20 mg  20 mg Oral ONCE   ??? oxyCODONE CR (OXYCONTIN) tablet 10 mg  10 mg Oral ONCE   ??? ceFAZolin (ANCEF) 1 g in 0.9% sodium chloride (MBP/ADV) 50 mL MBP  1 g IntraVENous ONCE   ??? carBAMazepine XR (TEGRETOL XR) tablet 200 mg  200 mg Oral BID   ??? tiotropium (SPIRIVA) inhalation capsule 18 mcg  1 Cap Inhalation DAILY   ??? 0.9% sodium chloride infusion  100 mL/hr IntraVENous CONTINUOUS   ??? celecoxib (CELEBREX) capsule 200 mg  200 mg Oral BID   ??? ceFAZolin (ANCEF) 2g IVPB in 100 ml 0.9% NS  2 g IntraVENous Q8H   ??? ferrous sulfate tablet 325 mg  1 Tab Oral BID WITH MEALS   ??? docusate sodium (COLACE) capsule 100 mg  100 mg Oral BID   ??? nicotine (NICODERM CQ) 14 mg/24 hr patch 1 Patch  1 Patch TransDERmal Q24H   ??? oxyCODONE CR (OXYCONTIN) tablet 20  mg  20 mg Oral Q12H   ??? pregabalin (LYRICA) capsule 75 mg  75 mg Oral BID   ??? HYDROmorphone (PF) (DILAUDID) 2 mg/mL injection           Review of Systems:  HEENT: No epistaxis, no nasal drainage, no difficulty in swallowing  Respiratory: no cough or SOB or hemoptysis  Cardiovascular: no chest pain, no palpitations, no chronic leg edema, no syncope  Gastrointestinal: no abd pain, no vomiting, no diarrhea, no bleeding symptoms  Genitourinary: No urinary symptoms  Integument/breast: No ulcers  Musculoskeletal: Left hip pain, hx of right hip placement before  Neurological: No focal weakness, no seizures, no headaches  Behvioral/Psych: No anxiety, no depression  Constitutional: No fever, no chills, no weight loss, no night sweats    Objective:   Vital Signs:    BP 125/81   Pulse 79   Temp 97.4 ??F (36.3 ??C)   Resp 16   Ht 5\' 10"  (1.778 m)   Wt 72.292 kg (159 lb 6 oz)   BMI 22.87 kg/m2   SpO2 92%    O2 Device: Nasal cannula   O2 Flow Rate (L/min): 3 l/min   Temp (24hrs), Avg:97.9 ??F (36.6 ??C), Min:97.4 ??F (36.3 ??C), Max:98.7 ??F (37.1 ??C)       Intake/Output:      Intake/Output Summary (Last 24 hours) at 09/13/11 1339  Last data filed at 09/13/11 1123   Gross per 24 hour   Intake   2000 ml   Output    100 ml   Net   1900 ml      Physical Exam:   General: comfortable  Neck: No adenopathy or thyroid swelling  CVS: S1S2 no murmurs  RS: Mod AE bilaterally, lungs clear  Abd: soft, non tender, no hepatosplenomegaly  Neuro: non focal, awake, alert  Extrm: no leg edema     Data review:   Labs:  No results found for this basename: WBC:3,HGB:3,HCT:3,PLT:3 in the last 72 hours  No results found for this basename: NA:3,K:3,CL:3,CO2:3,GLU:3,BUN:3,CREA:3,CA:3,MG:3,PHOS:3,ALB:3,TBIL:3,SGOT:3,ALT:3,INR:3 in the last 72 hours  No results found for this basename: PH:3,PCO2:3,PO2:3,HCO3:3,FIO2:3 in the last 72 hours    Imaging:  I have personally reviewed the patient???s radiographs and have reviewed the reports:       IMPRESSION:   1. OA Left hip - s/p left THR  2. COPD  3. Remote seizure hx on carbamazepine  4. Smoker       RECOMMENDATIONS:   Post op management per Ortho  Check CBC stat  Fluids  Spiriva daily  Prn alb nebs, incentive spirometry  Continue carbamazepine  Nicotine patch 21 mg/day  Discussed about smoking cessation briefly  DVT proph per Ortho  Check CXR tomorrow    Patient would benefit from outpatient pulmonary FU for COPD and smoking cessation   Discussed with patient and mother  Thank you for the consultation       Laurena Bering, MD

## 2011-09-13 NOTE — Interval H&P Note (Signed)
Date of Surgery Update:  [name] [name] was seen and examined.  There have been no significant clinical changes since the completion of the originally dated History and Physical.    Signed By: Gifford Shave, MD     September 13, 2011 7:15 AM

## 2011-09-13 NOTE — Progress Notes (Signed)
Problem: Mobility Impaired (Adult and Pediatric)  Goal: *Acute Goals and Plan of Care (Insert Text)  Patient will sup<->sit (S);  Sit<->stand with (A) device (S);  Ambulate >50??? with (A) device (S);  Up/down 2 stairs with CGA  PHYSICAL THERAPY EVALUATION    Patient: [name] [name] 65 y.o. male)  Date: 09/13/2011  Primary Diagnosis: END STAGE OSTEOARTHRITIS LEFT HIP  END STAGE OSTEOARTHRITIS LEFT HIP  Procedure(s) (LRB):  HIP ARTHROPLASTY TOTAL ANTERIOR APPROACH (Left) Day of Surgery   Precautions:  Fall;WBAT;Total hip      ASSESSMENT :   Based on the objective data described below, the patient presents with decreased ROM, decreased strength, decreased mobility.    Patient will benefit from skilled intervention to address the above impairments.  Patient???s rehabilitation potential is considered to be Fair  Factors which may influence rehabilitation potential include:   [ ]          None noted  [ ]          Mental ability/status  [X]          Medical condition  [X]          Home/family situation and support systems  [X]          Safety awareness  [X]          Pain tolerance/management  [ ]          Other:        PLAN :   Recommendations and Planned Interventions:  [X]            Bed Mobility Training             [ ]     Neuromuscular Re-Education  [X]            Transfer Training                   [ ]     Orthotic/Prosthetic Training  [X]            Gait Training                          [ ]     Modalities  [X]            Therapeutic Exercises          [ ]     Edema Management/Control  [X]            Therapeutic Activities            [X]     Patient and Family Training/Education  [ ]            Other (comment):    Frequency/Duration: Patient will be followed by physical therapy twice daily to address goals.  Discharge Recommendations: Home Health with 24 hour (A)/(S) initially  Further Equipment Recommendations for Discharge: bedside commode, rolling walker        SUBJECTIVE:    Patient stated ???Already been through this before. I know all about it.???      OBJECTIVE DATA SUMMARY:       Past Medical History   Diagnosis Date   ??? COPD     ??? Unspecified adverse effect of anesthesia         WHEN SPINAL WORE OFF, WAS IN EXCRUITIATING PAIN   ??? Seizure       Past Surgical History   Procedure Date   ??? Hx hip replacement 04/2011       RIGHT     Prior Level of Function/Home Situation: Per patient  was (I) PTA with RW.  Home Situation  Home Environment: Private residence  # Steps to Enter: 2   Rails to Enter: No  Wheelchair Ramp: No  One/Two Story Residence: One story  Living Alone: Yes   Support Systems: Family member(s);Parent  Patient Expects to be Discharged to:: Private residence  Current DME Used/Available at Home: Commode, bedside;Walker, rolling  Critical Behavior:  Neurologic State: Alert;Confused  Orientation Level: Oriented X4  Cognition: Decreased attention/concentration;Follows commands;Impulsive  Safety/Judgement: Awareness of environment;Decreased awareness of need for assistance;Decreased awareness of need for safety;Decreased insight into deficits  Psychosocial  Patient Behaviors: Calm;Cooperative  Purposeful Interaction: Yes  Strength:    Strength: Within functional limits except (L) LE not resisted. UEs grossly G+, (R) hip at least F+(not strongly resisted due to surgery about 3 months ago), knee G+.     Tone & Sensation:   Sensation: Intact proprioception (B) great toes    Range Of Motion:  AROM: Within functional limits except (L) hip not tested. (R) hip slightly limited also.  Functional Mobility:  Bed Mobility:  Supine to Sit: Moderate assistance;Verbal cues  Sit to Supine: Moderate assistance;Verbal cues  Scooting: CGA  Transfers:  Sit to Stand: Moderate assistance;Verbal cues;Safety concerns  Stand to Sit: Moderate assistance;Verbal cues;Safety concerns   Required frequent verbal cues to maintain hip precautions.  Balance:   Sitting: Intact;With support   Standing: Impaired;With support  Standing - Static: Constant support;Fair  Standing - Dynamic : Poor (Mod(A)) with RW. Static stand with RW CGA (F-)  Patient dizzy on initial sit and stand. Patient reported that dizziness cleared following about half a minute of deep slow breaths each time.  Ambulation/Gait Training:  Distance (ft): 8 Feet (ft)  Assistive Device: Walker, rolling  Ambulation - Level of Assistance: Moderate assistance   Required increased verbal cues for sequence, safety with RW. Small steps, slightly unsteady.  Therapeutic Exercises:   None at this time  Pain:  Pain Scale 1: Numeric (0 - 10)  Pain Intensity 1: 5  Pain Location 1: Hip  Pain Orientation 1: Left    Activity Tolerance:   fair  Please refer to the flowsheet for vital signs taken during this treatment.  After treatment:   [ ]          Patient left in no apparent distress sitting up in chair  [X]          Patient left in no apparent distress in bed  [X]          Call bell left within reach  [X]          Nursing notified  [ ]          Caregiver present  [ ]          Bed alarm activated      COMMUNICATION/EDUCATION:   [X]          Fall prevention education was provided and the patient/caregiver indicated understanding.  [X]          Patient/family have participated as able in goal setting and plan of care.  [X]          Patient/family agree to work toward stated goals and plan of care.  [ ]          Patient understands intent and goals of therapy, but is neutral about his/her participation.  [ ]          Patient is unable to participate in goal setting and plan of care.    Thank you for this referral.  Doreatha Martin, PT   Time Calculation: 30 mins

## 2011-09-13 NOTE — Progress Notes (Signed)
Location: [facility] - [address]  Attn.: MARLOW, AARON L.  DOB: 1946/08/17 / Age: 65  MR#: [id number] / Admit#: [id number]  Pt. First Name: [name]  Pt. Last Name: [name] [name], [place]  [address]  [phone]  [facility]                        Case Management - Progress Note  Initial Open Date: 09/13/2011  Case Manager:    Initial Open Date:  Social Worker:  Expected Date of Discharge:  Transferred From:  ECF Bed Held Until:  Bed Held By:  Power of Attorney:  POA/Guardian/Conservator Capacity:  Primary Caregiver:  Living Arrangements:  Source of Income:  Payee:  Psychosocial History:  Cultural/Religious/Language Issues:  Education Level:  ADLS/Current Living Arrangements Issues:  Past Providers:  Will patient perform self care at discharge?  Anticipated Discharge Disposition Goal:  Assessment/Plan:      09/13/2011 01:53P  Received HC order.  FOC given to pt,  list of agencies given and the pt chose [facility] .  Notified agency re:  [facility] referral.  [facility]  [phone]  Resources at Discharge:  Home Health Referral  Service Providers at Discharge:  [facility]  Dictating Provider:

## 2011-09-13 NOTE — Other (Signed)
Post-Anesthesia Evaluation & Assessment    Visit Vitals   Item Reading   ??? BP 130/67   ??? Pulse 70   ??? Temp 97.6 ??F (36.4 ??C)   ??? Resp 14   ??? Ht 5\' 10"  (1.778 m)   ??? Wt 72.292 kg (159 lb 6 oz)   ??? BMI 22.87 kg/m2   ??? SpO2 99%       Nausea/Vomiting: no nausea    Post-operative hydration adequate.    Pain score (VAS): 3    Mental status & Level of consciousness: alert and oriented x 3    Neurological status: moves all extremities, sensation grossly intact    Pulmonary status: airway patent, supplemental oxygen required no. none    Complications related to anesthesia: none    Patient has met all discharge requirements.    Additional comments: none    Pola Corn, MD  September 13, 2011

## 2011-09-13 NOTE — Other (Signed)
Arrived on unit via hospital bed accompanied by his Mom, friend and PACU Staff. Drowsy but oriented x 3. Respirations easy on O2 at 2l per NC. Original drsg to left hip is clean, dry and intact,  CMS WNl  Required documentation completed.  ABD Pillow in use as ordered.  Oriented x 3. SR up x 2. CB/telephone at hand.

## 2011-09-13 NOTE — Op Note (Signed)
[facility]                               [address] [place], [place] [address]                                 OPERATIVE REPORT    PATIENT:     [name] [name], [name] [name]  MRN:            [id number]    DATE:  BILLING:     [id number]  ROOM:        PACUPACUPL  ATTENDING:   Gifford Shave, MD,FRCSC  DICTATING:   Gifford Shave, MD,FRCSC      PREOPERATIVE DIAGNOSIS: End-stage arthritis, left hip, with osteophytosis  inferiorly.    POSTOPERATIVE DIAGNOSIS: End-stage arthritis, left hip, with osteophytosis  inferiorly.    COMPLICATIONS: None.    FIRST ASSISTANT: Carin Hock    SECOND ASSISTANT: Theodoro Grist ________    ANESTHESIOLOGIST: Dr. Junius Finner    BLOOD LOSS: 275.    OPERATIVE PROCEDURE: Left total hip replacement using the Accolade system  with a size #5 high offset, 127 neck angle femoral component, Accolade 2  size 5, and 58 Tritanium cup, a neutral X3 36 liner and a 36 -2.5 Biolox  ceramic femoral head.    DESCRIPTION OF OPERATIVE PROCEDURE: After the anesthetic was successfully  induced, it was confirmed the patient did receive preoperative antibiotics  and Coumadin. Leg lengths determined. Time-out performed. We performed a  standard direct anterior approach.    We found the leash of vessels and we actually had a second branch which we  tied and used the Aquamantys on.    Released the lateralis fascia and the Gelpi was introduced, found the  lateral border of rectus and then resected the precapsular fat.    We then did our arthrotomy and repositioned our Hohmann's with the guide of  the C-arm, confirmed our neck osteotomy level. He had quite a thick  ligamentum teres which was divided and the head extracted.    We protected the musculature and neurovascular structures at all times.    Debrided the cup in the usual fashion, identified the medial wall through  the foveal notch. Released transverse acetabular ligament and with the aid  of C-arm control, we  reamed up the cup, and went for a line-to-line ream,  had excellent fit and fill, trialed this and it was very stable with a 58  and augmented with a couple of screws.    Removed the posterior inferior osteophytes. Used the [employer] around the cup.    We then removed the posterior capsule.    I released the traction and did our standard release, and instrumented  around the femur, stayed lateral with a Leksell rongeur in neutral  alignment and used the canal finder and with the C-arm as well, and then  broached up to accommodate the implant.    We were very tight. C-arm confirmed. We trialed this and it was between  the, I really liked the -2.5 with excellent restoration of leg lengths.  It was very stable. No impingement with external rotation.    Pulse lavaged out, implanted the definitive component, retrialed, and  I was  happiest with the -25. Cleaned the trunnion, impacted on, again reducing  the hip. Routine closure and clips for the skin. No complications.    BLOOD LOSS: 275.    The patient was brought to the recovery room and x-rays intraoperatively  look wonderful.                        Date:______Time:______Signature________________________________          Gifford Shave, MD,FRCSC    ALM:wmx  D: 09/13/2011 T: 09/13/2011 10:39 A  Job: [id number]  CScriptDoc #: [id number]    cc:     Gifford Shave, MD,FRCSC

## 2011-09-14 LAB — METABOLIC PANEL, COMPREHENSIVE
A-G Ratio: 1.1 (ref 0.8–1.7)
ALT (SGPT): 27 U/L — ABNORMAL LOW (ref 30–65)
AST (SGOT): 17 U/L (ref 15–37)
Albumin: 2.6 g/dL — ABNORMAL LOW (ref 3.4–5.0)
Alk. phosphatase: 148 U/L — ABNORMAL HIGH (ref 50–136)
Anion gap: 5 mmol/L (ref 5–15)
BUN/Creatinine ratio: 15 (ref 12–20)
BUN: 12 MG/DL (ref 7–18)
Bilirubin, total: 0.3 MG/DL (ref 0.2–1.0)
CO2: 27 MMOL/L (ref 21–32)
Calcium: 7.7 MG/DL — ABNORMAL LOW (ref 8.4–10.4)
Chloride: 104 MMOL/L (ref 100–108)
Creatinine: 0.8 MG/DL (ref 0.6–1.3)
GFR est AA: >60 mL/min/{1.73_m2} (ref >60)
GFR est non-AA: >60 mL/min/{1.73_m2} (ref >60)
Globulin: 2.4 g/dL (ref 2.0–4.0)
Glucose: 120 MG/DL — ABNORMAL HIGH (ref 74–99)
Potassium: 3.9 MMOL/L (ref 3.5–5.5)
Protein, total: 5 g/dL — ABNORMAL LOW (ref 6.4–8.2)
Sodium: 136 MMOL/L (ref 136–145)

## 2011-09-14 LAB — CBC WITH AUTOMATED DIFF
ABS. BASOPHILS: 0 10*3/uL (ref 0.0–0.1)
ABS. EOSINOPHILS: 0 10*3/uL (ref 0.0–0.4)
ABS. LYMPHOCYTES: 1.8 10*3/uL (ref 0.9–3.6)
ABS. MONOCYTES: 1.4 10*3/uL — ABNORMAL HIGH (ref 0.05–1.2)
ABS. NEUTROPHILS: 8.8 10*3/uL — ABNORMAL HIGH (ref 1.8–8.0)
BASOPHILS: 0 % (ref 0–2)
EOSINOPHILS: 0 % (ref 0–5)
HCT: 33.7 % — ABNORMAL LOW (ref 36.0–48.0)
HGB: 10.9 g/dL — ABNORMAL LOW (ref 13.0–16.0)
LYMPHOCYTES: 15 % — ABNORMAL LOW (ref 21–52)
MCH: 31.6 PG (ref 24.0–34.0)
MCHC: 32.3 g/dL (ref 31.0–37.0)
MCV: 97.7 FL — ABNORMAL HIGH (ref 74.0–97.0)
MONOCYTES: 12 % — ABNORMAL HIGH (ref 3–10)
MPV: 10.3 FL (ref 9.2–11.8)
NEUTROPHILS: 73 % (ref 40–73)
PLATELET: 253 10*3/uL (ref 135–420)
RBC: 3.45 M/uL — ABNORMAL LOW (ref 4.70–5.50)
RDW: 13.5 % (ref 11.6–14.5)
WBC: 12.1 10*3/uL (ref 4.6–13.2)

## 2011-09-14 LAB — PROTHROMBIN TIME + INR
INR: 1.2 (ref 0.0–1.2)
Prothrombin time: 14.9 s (ref 11.5–15.2)

## 2011-09-14 MED ORDER — PHARMACY WARFARIN NOTE
Status: DC | PRN
Start: 2011-09-14 — End: 2011-09-15

## 2011-09-14 MED ORDER — WARFARIN 10 MG TAB
10 mg | Freq: Once | ORAL | Status: AC
Start: 2011-09-14 — End: 2011-09-14
  Administered 2011-09-14: 22:00:00 via ORAL

## 2011-09-14 NOTE — Other (Signed)
Awake on walking rounds voicing no complaints at this time. Drsg remains clean,dry and intact. CMS WNL. SR up x 2. CB/telephone at hand.

## 2011-09-14 NOTE — Progress Notes (Signed)
Progress Note    Patient: [name] [name]               Sex: male          DOA: 09/13/2011       Date of Birth:  [birth year]      Age:  65 y.o.        LOS:  LOS: 1 day           Subjective:     [name] [name] is a 65 y.o. year old male who is being seen for copd, seizure disorder. He reports no C/P, SOB, cough  Eating well..    Objective:      Vital Signs:  Visit Vitals   Item Reading   ??? BP 130/74   ??? Pulse 70   ??? Temp 97.9 ??F (36.6 ??C)   ??? Resp 14   ??? Ht 5\' 10"  (1.778 m)   ??? Wt 72.292 kg (159 lb 6 oz)   ??? BMI 22.87 kg/m2   ??? SpO2 100%       Physical Exam:  HEENT:WNL  Neck:  Cardiac: no M, no G reg rhythm  Lungs:insp wheeze left lung  anteriorly no rales rhonchi  Abdomen: soft nontender  Skin:no rash  Neuro:alert oriented       Intake and Output:  Last three shifts:  10/15 1900 - 10/17 0659  In: 2900 [P.O.:900; I.V.:2000]  Out: 650 [Urine:550]    Lab Results:  Recent Results (from the past 24 hour(s))   CBC W/O DIFF    Collection Time    09/13/11  2:17 PM       Component Value Range    WBC 20.3 (*) 4.6 - 13.2 K/uL    RBC 4.18 (*) 4.70 - 5.50 M/uL    HGB 13.4  13.0 - 16.0 g/dL    HCT 16.1  09.6 - 04.5 %    MCV 96.9  74.0 - 97.0 FL    MCH 32.1  24.0 - 34.0 PG    MCHC 33.1  31.0 - 37.0 g/dL    RDW 40.9  81.1 - 91.4 %    PLATELET 273  135 - 420 K/uL    MPV 9.6  9.2 - 11.8 FL   METABOLIC PANEL, COMPREHENSIVE    Collection Time    09/14/11  2:30 AM       Component Value Range    Sodium 136  136 - 145 MMOL/L    Potassium 3.9  3.5 - 5.5 MMOL/L    Chloride 104  100 - 108 MMOL/L    CO2 27  21 - 32 MMOL/L    Anion gap 5  5 - 15 mmol/L    Glucose 120 (*) 74 - 99 MG/DL    BUN 12  7 - 18 MG/DL    Creatinine 0.8  0.6 - 1.3 MG/DL    BUN/Creatinine ratio 15  12 - 20      GFR est AA >60  >60 ml/min/1.61m2    GFR est non-AA >60  >60 ml/min/1.69m2    Calcium 7.7 (*) 8.4 - 10.4 MG/DL    Bilirubin, total 0.3  0.2 - 1.0 MG/DL    ALT 27 (*) 30 - 65 U/L    AST 17  15 - 37 U/L    Alk. phosphatase 148 (*) 50 - 136 U/L     Protein, total 5.0 (*) 6.4 - 8.2 g/dL    Albumin 2.6 (*) 3.4 - 5.0 g/dL  Globulin 2.4  2.0 - 4.0 g/dL    A-G Ratio 1.1  0.8 - 1.7     CBC WITH AUTOMATED DIFF    Collection Time    09/14/11  2:30 AM       Component Value Range    WBC 12.1  4.6 - 13.2 K/uL    RBC 3.45 (*) 4.70 - 5.50 M/uL    HGB 10.9 (*) 13.0 - 16.0 g/dL    HCT 16.1 (*) 09.6 - 48.0 %    MCV 97.7 (*) 74.0 - 97.0 FL    MCH 31.6  24.0 - 34.0 PG    MCHC 32.3  31.0 - 37.0 g/dL    RDW 04.5  40.9 - 81.1 %    PLATELET 253  135 - 420 K/uL    MPV 10.3  9.2 - 11.8 FL    NEUTROPHILS 73  40 - 73 %    LYMPHOCYTES 15 (*) 21 - 52 %    MONOCYTES 12 (*) 3 - 10 %    EOSINOPHILS 0  0 - 5 %    BASOPHILS 0  0 - 2 %    ABS. NEUTROPHILS 8.8 (*) 1.8 - 8.0 K/UL    ABS. LYMPHOCYTES 1.8  0.9 - 3.6 K/UL    ABS. MONOCYTES 1.4 (*) 0.05 - 1.2 K/UL    ABS. EOSINOPHILS 0.0  0.0 - 0.4 K/UL    ABS. BASOPHILS 0.0  0.0 - 0.1 K/UL    DF AUTOMATED     PROTHROMBIN TIME    Collection Time    09/14/11  2:30 AM       Component Value Range    Prothrombin time 14.9  11.5 - 15.2 sec    INR 1.2  0.0 - 1.2         Images:CXR reviewed large irreg leison LLL felt to be unchanged granuloma by radiology and unchanged  No images are attached to the encounter or orders placed in the encounter.    Medications:  Current Facility-Administered Medications   Medication Dose Route Frequency   ??? WARFARIN INFORMATION NOTE (COUMADIN)   Other PRN   ??? albuterol (PROVENTIL HFA, VENTOLIN HFA) inhaler 2 Puff  2 Puff Inhalation Q4H PRN   ??? carBAMazepine XR (TEGRETOL XR) tablet 200 mg  200 mg Oral BID   ??? tiotropium (SPIRIVA) inhalation capsule 18 mcg  1 Cap Inhalation DAILY   ??? 0.9% sodium chloride infusion  100 mL/hr IntraVENous CONTINUOUS   ??? acetaminophen (TYLENOL) tablet 650 mg  650 mg Oral Q4H PRN   ??? celecoxib (CELEBREX) capsule 200 mg  200 mg Oral BID   ??? oxyCODONE-acetaminophen (PERCOCET 7.5) 7.5-325 mg per tablet 1-2 Tab  1-2 Tab Oral Q4H PRN    ??? HYDROmorphone (PF) (DILAUDID) injection 1 mg  1 mg IntraVENous Q3H PRN   ??? naloxone (NARCAN) injection 0.4 mg  0.4 mg IntraVENous PRN   ??? ceFAZolin (ANCEF) 2g IVPB in 100 ml 0.9% NS  2 g IntraVENous Q8H   ??? ferrous sulfate tablet 325 mg  1 Tab Oral BID WITH MEALS   ??? ondansetron (ZOFRAN) injection 4 mg  4 mg IntraVENous Q4H PRN   ??? prochlorperazine (COMPAZINE) tablet 10 mg  10 mg Oral TID PRN   ??? diphenhydrAMINE (BENADRYL) injection 12.5 mg  12.5 mg IntraVENous Q6H PRN   ??? magnesium hydroxide (MILK OF MAGNESIA) oral suspension 30 mL  30 mL Oral DAILY PRN   ??? docusate sodium (COLACE) capsule 100 mg  100  mg Oral BID   ??? bisacodyl (DULCOLAX) tablet 5 mg  5 mg Oral DAILY PRN   ??? zolpidem (AMBIEN) tablet 5 mg  5 mg Oral QHS PRN   ??? oxyCODONE CR (OXYCONTIN) tablet 20 mg  20 mg Oral Q12H   ??? pregabalin (LYRICA) capsule 75 mg  75 mg Oral BID   ??? HYDROmorphone (PF) (DILAUDID) 2 mg/mL injection       ??? nicotine (NICODERM CQ) 21 mg/24 hr patch 1 Patch  1 Patch TransDERmal DAILY   ??? albuterol (PROVENTIL VENTOLIN) nebulizer solution 2.5 mg  2.5 mg Nebulization Q4H PRN   ??? DISCONTD: lactated ringers infusion  75 mL/hr IntraVENous CONTINUOUS   ??? DISCONTD: HYDROmorphone (DILAUDID) injection 0.2 mg  0.2 mg IntraVENous Multiple   ??? DISCONTD: nicotine (NICODERM CQ) 14 mg/24 hr patch 1 Patch  1 Patch TransDERmal Q24H       Assessment:   Acute bld loss anemia hemodynamically stable  COPD stable  Solitary insp wheeze asymptomatic and no find for a obstructing leison on CXR    Problem List:  Active Problems:   * No active hospital problems. *       Plan:     Maintenance meds  Monitor CBC    Shanira Tine Jerolyn Shin, MD  September 14, 2011  10:31 AM

## 2011-09-14 NOTE — Other (Signed)
.  Verbal shift change report given to cynthiawhittleseyrn (oncoming nurse) by rosern (offgoing nurse).  Report given with SBAR, Kardex, Intake/Output and MAR.

## 2011-09-14 NOTE — Progress Notes (Signed)
Ortho    Pt. Seen and evaluated.  Doing well, pain well controlled, up with PT  Denies cp, sob, abd pain    BP 130/74   Pulse 70   Temp 97.9 ??F (36.6 ??C)   Resp 14   Ht 5\' 10"  (1.778 m)   Wt 72.292 kg (159 lb 6 oz)   BMI 22.87 kg/m2   SpO2 100%    left total hip replacement  left hip Woundclean, dry, no drainage  Sensory intact to LT  Motor intact  nv intact  Neg calf tenderness.    Labs.  CBC  @  CBC:   Lab Results   Component Value Date/Time    WBC 12.1 09/14/2011  2:30 AM    RBC 3.45 09/14/2011  2:30 AM    HGB 10.9 09/14/2011  2:30 AM    HCT 33.7 09/14/2011  2:30 AM    PLATELET 253 09/14/2011  2:30 AM    and BMP:   Lab Results   Component Value Date/Time    Glucose 120 09/14/2011  2:30 AM    Sodium 136 09/14/2011  2:30 AM    Potassium 3.9 09/14/2011  2:30 AM    Chloride 104 09/14/2011  2:30 AM    CO2 27 09/14/2011  2:30 AM    BUN 12 09/14/2011  2:30 AM    Creatinine 0.8 09/14/2011  2:30 AM    Calcium 7.7 09/14/2011  2:30 AM   @  Coagulation  Lab Results   Component Value Date    INR 1.2 09/14/2011    APTT 29.8 08/30/2011      Basic Metabolic Profile  Lab Results   Component Value Date    NA 136 09/14/2011    CO2 27 09/14/2011    BUN 12 09/14/2011         Assesment:leftOrthopedic / Rheumatologic: Total Hip Replacement    Plan:  Coumadin, PT, home thurs

## 2011-09-14 NOTE — Other (Signed)
PT.AWAKE,ALERT AND ORIENTED X3.NO ACUTE DISTRESS NOTED  WITH NO COMPLAINTS AT THIS TIME.PT. DANGLED ON SIDE OF BED.PT.TOL.WELL.NO COMPLAINTS.

## 2011-09-14 NOTE — Progress Notes (Signed)
Problem: Mobility Impaired (Adult and Pediatric)  Goal: *Acute Goals and Plan of Care (Insert Text)  Patient will sup<->sit (S);  Sit<->stand with (A) device (S);  Ambulate >50??? with (A) device (S);  Up/down 2 stairs with CGA   Outcome: Progressing Towards Goal  PHYSICAL THERAPY TREATMENT    Patient: [name] [name] (65 y.o. male)  Date: 09/14/2011  Diagnosis: END STAGE OSTEOARTHRITIS LEFT HIP  END STAGE OSTEOARTHRITIS LEFT HIP <principal problem not specified>  Procedure(s) (LRB):  HIP ARTHROPLASTY TOTAL ANTERIOR APPROACH (Left) 1 Day Post-Op  Precautions: Total hip;WBAT  Chart, physical therapy assessment, plan of care and goals were reviewed.      ASSESSMENT:   Patient continues to meet goals. Pt now independently ambulating with RW and able to Independently negotiate stairs.  Progression toward goals:  [X]       Improving appropriately and progressing toward goals  [ ]       Improving slowly and progressing toward goals  [ ]       Not making progress toward goals and plan of care will be adjusted       PLAN:   Patient continues to benefit from skilled intervention to address the above impairments. Continue treatment per established plan of care.  Discharge Recommendations:  Home Health  Further Equipment Recommendations for Discharge:  bedside commode and rolling walker       SUBJECTIVE:   Patient stated ???This time is so much easier.???      OBJECTIVE DATA SUMMARY:   Critical Behavior:  Neurologic State: Alert  Orientation Level: Oriented X4  Cognition: Follows commands;Appropriate decision making  Safety/Judgement: Awareness of environment;Good awareness of safety precautions  Functional Mobility Training:  Bed Mobility:  Rolling: CGA  Supine to Sit: CGA;Minimal assistance  Scooting: CGA  Transfers:  Sit to Stand: Independent  Stand to Sit: Independent  Bed to Chair: Independent  Balance:  Sitting: Intact  Standing: Intact  Standing - Static: Good  Standing - Dynamic : Good  Ambulation/Gait Training:   Distance (ft): 100 Feet (ft) (x 2)  Assistive Device: Walker, rolling  Ambulation - Level of Assistance: Supervision/Set-up  Stairs:  Number of Stairs Trained: 8   Stairs - Level of Assistance: Modified independent (uses railing)  Rail Use: Both  Therapeutic Exercises:   Continued with AM education and exercises  Pain:  Pain Scale 1: Numeric (0 - 10)  Pain Intensity 1: 4  Pain Location 1: Hip  Pain Orientation 1: Left  Pain Description 1: Aching  Activity Tolerance:   Good  Please refer to the flowsheet for vital signs taken during this treatment.  After treatment:   [X]  Patient left in no apparent distress sitting up in chair  [ ]  Patient left in no apparent distress in bed  [X]  Call bell left within reach  [ ]  Nursing notified  [X]  Caregiver present  [ ]  Bed alarm activated      Alphonzo Severance, PTA   Time Calculation: 30 mins

## 2011-09-14 NOTE — Progress Notes (Signed)
Spiritual Care Assessment/Progress Notes    [name] [name] [id number]  [id number]    February 16, 1946  65 y.o.  male    Patient Telephone Number: [phone] (home)   Religious Affiliation: [employer]   Language: English   Extended Emergency Contact Information  Primary Emergency Contact: Edwards,Steven  Relation: Friend   There are no active problems to display for this patient.       Date: 09/14/2011       Level of Religious/Spiritual Activity:  [x]          Involved in faith tradition/spiritual practice    []          Not involved in faith tradition/spiritual practice  [x]          Spiritually oriented    []          Claims no spiritual orientation    []          seeking spiritual identity  []          Feels alienated from religious practice/tradition  []          Feels angry about religious practice/tradition  [x]          Spirituality/religious tradition is a Theatre stage manager for coping at this time.  []          Not able to assess due to medical condition    Services Provided Today:  []          crisis intervention    []          reading Scriptures  [x]          spiritual assessment    []          prayer  [x]          empathic listening/emotional support  []          rites and rituals (cite in comments)  []          life review     []          religious support  []          theological development   []          advocacy  []          ethical dialog     []          blessing  []          bereavement support    []          support to family  []          anticipatory grief support   []          help with AMD  []          spiritual guidance    []          meditation      Spiritual Care Needs  [x]          Emotional Support  []          Spiritual/Religious Care  []          Loss/Adjustment  []          Advocacy/Referral /Ethics  []          No needs expressed at this time  []          Other: (note in comments)  Spiritual Care Plan  []          Follow up visits with pt/family  []          Provide materials  []          Schedule sacraments   []   Contact [facility] [employer]  [x]          Follow up as needed  []          Other: (note in comments)     Comments:

## 2011-09-14 NOTE — Progress Notes (Signed)
Problem: Mobility Impaired (Adult and Pediatric)  Goal: *Acute Goals and Plan of Care (Insert Text)  Patient will sup<->sit (S);  Sit<->stand with (A) device (S);  Ambulate >50??? with (A) device (S);  Up/down 2 stairs with CGA   Outcome: Progressing Towards Goal  PHYSICAL THERAPY TREATMENT    Patient: [name] [name] (65 y.o. male)  Date: 09/14/2011  Diagnosis: END STAGE OSTEOARTHRITIS LEFT HIP  END STAGE OSTEOARTHRITIS LEFT HIP <principal problem not specified>  Procedure(s) (LRB):  HIP ARTHROPLASTY TOTAL ANTERIOR APPROACH (Left) 1 Day Post-Op  Precautions: Total hip;WBAT  Chart, physical therapy assessment, plan of care and goals were reviewed.      ASSESSMENT:   Patient meeting goals. Reviewed safety with pt as well as THR precautions. Pt able to verbalize at end of treatment session. Ortho PA observed pt ambulating in hallway.  Progression toward goals:  [X]       Improving appropriately and progressing toward goals  [ ]       Improving slowly and progressing toward goals  [ ]       Not making progress toward goals and plan of care will be adjusted       PLAN:   Patient continues to benefit from skilled intervention to address the above impairments. Continue treatment per established plan of care.  Discharge Recommendations:  Home Health  Further Equipment Recommendations for Discharge:  bedside commode, rolling walker ( Pt reports he still has these items from his other hip surgery.)       SUBJECTIVE:   Patient stated ???I dont remember much from when I was here the last time.???      OBJECTIVE DATA SUMMARY:   Critical Behavior:  Neurologic State: Alert  Orientation Level: Oriented X4  Cognition: Follows commands;Appropriate decision making  Safety/Judgement: Awareness of environment;Good awareness of safety precautions  Functional Mobility Training:  Transfers:  Sit to Stand: CGA;Supervision  Stand to Sit: CGA;Supervision  Bed to Chair: Supervision  Balance:  Sitting: Intact  Standing: Intact;With support   Ambulation/Gait Training:  Distance (ft): 150 Feet (ft)  Assistive Device: Walker, rolling  Ambulation - Level of Assistance: SBA  Therapeutic Exercises:   THR exercises and precautions  Pain:  Pain Scale 1: Numeric (0 - 10)  Pain Intensity 1: 5  Pain Location 1: Hip  Pain Orientation 1: Left  Pain Description 1: Aching     Activity Tolerance:   Good  Please refer to the flowsheet for vital signs taken during this treatment.  After treatment:   [X]  Patient left in no apparent distress sitting up in chair  [ ]  Patient left in no apparent distress in bed  [X]  Call bell left within reach  [X]  Nursing notified  [ ]  Caregiver present  [ ]  Bed alarm activated      Alphonzo Severance, PTA   Time Calculation: 30 mins

## 2011-09-14 NOTE — Other (Signed)
Bedside and Verbal shift change report given to R.Valcenia,RN (oncoming nurse) by Raelene Bott (offgoing nurse).  Report given with SBAR, Kardex, Intake/Output and MAR.

## 2011-09-14 NOTE — Other (Signed)
Bedside shift change report given to cynthia whttlesey rn (oncoming nurse) by rosern (offgoing nurse).  Report given with SBAR, Kardex, Intake/Output and MAR.

## 2011-09-14 NOTE — Progress Notes (Signed)
Problem: Self Care Deficits Care Plan (Adult)  Goal: *Acute Goals and Plan of Care (Insert Text)  Occupational Therapy Goals  Initiated 09/14/2011 within 7 day(s).    1. Patient will perform bathing with modified independence.  2. Patient will perform lower body dressing with modified independence.  3. Patient will perform toilet transfers with modified independence.  4. Patient will perform all aspects of toileting with modified independence.  OCCUPATIONAL THERAPY EVALUATION    Patient: [name] [name] (65 y.o. male)  Date: 09/14/2011  Primary Diagnosis: END STAGE OSTEOARTHRITIS LEFT HIP  END STAGE OSTEOARTHRITIS LEFT HIP  Procedure(s) (LRB):  HIP ARTHROPLASTY TOTAL ANTERIOR APPROACH (Left) 1 Day Post-Op   Precautions:   Total hip;WBAT      ASSESSMENT :   Based on the objective data described below, the patient presents with Decreased mobility, decreased ADL's, and decreased strength.  Patient will benefit from skilled intervention to address the above impairments.  Patient???s rehabilitation potential is considered to be Good  Factors which may influence rehabilitation potential include:   [X]              None noted  [ ]              Mental ability/status  [ ]              Medical condition  [ ]              Home/family situation and support systems  [ ]              Safety awareness  [ ]              Pain tolerance/management  [ ]              Other:        PLAN :   Recommendations and Planned Interventions:  [X]                Self Care Training                  [X]         Therapeutic Activities  [X]                Functional Mobility Training    [ ]         Cognitive Retraining  [X]                Therapeutic Exercises           [ ]         Endurance Activities  [ ]                Balance Training                   [ ]         Neuromuscular Re-Education  [ ]                Visual/Perceptual Training     [X]    Home Safety Training  [X]                Patient Education                 [X]         Family Training/Education   [ ]                Other (comment):    Frequency/Duration: Patient will be followed by occupational therapy 1-2 times per day/4-7 days per week to address goals.  Discharge Recommendations: Home Health  Further Equipment Recommendations for Discharge: N/A       SUBJECTIVE:   Patient stated ???I went through this in June with the other hip.???      OBJECTIVE DATA SUMMARY:       Past Medical History   Diagnosis Date   ??? COPD     ??? Unspecified adverse effect of anesthesia         WHEN SPINAL WORE OFF, WAS IN EXCRUITIATING PAIN   ??? Seizure       Past Surgical History   Procedure Date   ??? Hx hip replacement 04/2011       RIGHT     Prior Level of Function/Home Situation: independent  Home Situation  Home Environment: Private residence  # Steps to Enter: 2   Rails to Enter: No  Wheelchair Ramp: No  One/Two Story Residence: One story  Living Alone: Yes   Support Systems: Parent;Family member(s)  Patient Expects to be Discharged to:: Private residence  Current DME Used/Available at Home: Commode, bedside;Walker, rolling  [X]   Right hand dominant          [ ]   Left hand dominant  Cognitive/Behavioral Status:  Neurologic State: Alert  Orientation Level: Oriented X4  Cognition: Follows commands;Appropriate decision making  Safety/Judgement: Awareness of environment;Good awareness of safety precautions  Skin: intact  Edema: none noted in UE's  Vision/Perceptual:    Tracking: Able to track stimulus in all quadrants w/o difficulty    Corrective Lenses: Glasses  Coordination:  Coordination: Within functional limits  Fine Motor Skills-Upper: Left Intact;Right Intact    Gross Motor Skills-Upper: Left Intact;Right Intact  Balance:  Sitting: Intact  Standing: Intact;With support  Strength:  Strength: Within functional limits  Tone & Sensation:  Tone: Normal  Sensation: Intact  Range of Motion:  AROM: Within functional limits  PROM: Within functional limits  Functional Mobility and Transfers for ADLs:  Bed Mobility:  Rolling: CGA   Supine to Sit: CGA;Minimal assistance  Scooting: CGA  Transfers:  Sit to Stand: Minimum assistance  Bed to Chair: CGA  ADL Assessment:  Feeding: Independent    Oral Facial Hygiene/Grooming: Supervision/set up    Bathing: Minimum assistance    Upper Body Dressing: Supervision/set up    Lower Body Dressing: Minimum assistance    Toileting: Contact guard assistance  Cognitive Retraining  Safety/Judgement: Awareness of environment;Good awareness of safety precautions  Pain:  Pain Scale 1: Numeric (0 - 10)  Pain Intensity 1: 5  Pain Location 1: Hip  Pain Orientation 1: Left  Pain Description 1: Aching  Activity Tolerance:   Good   Please refer to the flowsheet for vital signs taken during this treatment.  After treatment:   [X]  Patient left in no apparent distress sitting up in chair  [ ]  Patient left in no apparent distress in bed  [X]  Call bell left within reach  [ ]  Nursing notified  [ ]  Caregiver present  [ ]  Bed alarm activated      COMMUNICATION/EDUCATION:   [X]  Home safety education was provided and the patient/caregiver indicated understanding.  [X]  Patient/family have participated as able in goal setting and plan of care.  [X]  Patient/family agree to work toward stated goals and plan of care.  [ ]  Patient understands intent and goals of therapy, but is neutral about his/her participation.  [ ]  Patient is unable to participate in goal setting and plan of care.    Thank you for this referral.  Korin Setzler A MORAVEK, MS, OTR/L  Time Calculation: 20 mins

## 2011-09-15 LAB — METABOLIC PANEL, COMPREHENSIVE
A-G Ratio: 0.9 (ref 0.8–1.7)
ALT (SGPT): 23 U/L — ABNORMAL LOW (ref 30–65)
AST (SGOT): 19 U/L (ref 15–37)
Albumin: 2.4 g/dL — ABNORMAL LOW (ref 3.4–5.0)
Alk. phosphatase: 159 U/L — ABNORMAL HIGH (ref 50–136)
Anion gap: 2 mmol/L — ABNORMAL LOW (ref 5–15)
BUN/Creatinine ratio: 13 (ref 12–20)
BUN: 10 MG/DL (ref 7–18)
Bilirubin, total: 0.2 MG/DL (ref 0.2–1.0)
CO2: 31 MMOL/L (ref 21–32)
Calcium: 8 MG/DL — ABNORMAL LOW (ref 8.4–10.4)
Chloride: 106 MMOL/L (ref 100–108)
Creatinine: 0.8 MG/DL (ref 0.6–1.3)
GFR est AA: >60 mL/min/{1.73_m2} (ref >60)
GFR est non-AA: >60 mL/min/{1.73_m2} (ref >60)
Globulin: 2.7 g/dL (ref 2.0–4.0)
Glucose: 88 MG/DL (ref 74–99)
Potassium: 3.9 MMOL/L (ref 3.5–5.5)
Protein, total: 5.1 g/dL — ABNORMAL LOW (ref 6.4–8.2)
Sodium: 139 MMOL/L (ref 136–145)

## 2011-09-15 LAB — CBC WITH AUTOMATED DIFF
ABS. BASOPHILS: 0 10*3/uL (ref 0.0–0.1)
ABS. EOSINOPHILS: 0.2 10*3/uL (ref 0.0–0.4)
ABS. LYMPHOCYTES: 1.7 10*3/uL (ref 0.9–3.6)
ABS. MONOCYTES: 1.3 10*3/uL — ABNORMAL HIGH (ref 0.05–1.2)
ABS. NEUTROPHILS: 6.9 10*3/uL (ref 1.8–8.0)
BASOPHILS: 0 % (ref 0–2)
EOSINOPHILS: 2 % (ref 0–5)
HCT: 33.4 % — ABNORMAL LOW (ref 36.0–48.0)
HGB: 10.9 g/dL — ABNORMAL LOW (ref 13.0–16.0)
LYMPHOCYTES: 17 % — ABNORMAL LOW (ref 21–52)
MCH: 31.9 PG (ref 24.0–34.0)
MCHC: 32.6 g/dL (ref 31.0–37.0)
MCV: 97.7 FL — ABNORMAL HIGH (ref 74.0–97.0)
MONOCYTES: 12 % — ABNORMAL HIGH (ref 3–10)
MPV: 10.4 FL (ref 9.2–11.8)
NEUTROPHILS: 69 % (ref 40–73)
PLATELET: 230 10*3/uL (ref 135–420)
RBC: 3.42 M/uL — ABNORMAL LOW (ref 4.70–5.50)
RDW: 13.5 % (ref 11.6–14.5)
WBC: 10.1 10*3/uL (ref 4.6–13.2)

## 2011-09-15 LAB — PROTHROMBIN TIME + INR
INR: 1.4 — ABNORMAL HIGH (ref 0.0–1.2)
Prothrombin time: 17 s — ABNORMAL HIGH (ref 11.5–15.2)

## 2011-09-15 MED ORDER — WARFARIN 3 MG TAB
3 mg | ORAL_TABLET | Freq: Every day | ORAL | Status: AC
Start: 2011-09-15 — End: 2011-10-06

## 2011-09-15 MED ORDER — WARFARIN 7.5 MG TAB
7.5 mg | Freq: Once | ORAL | Status: AC
Start: 2011-09-15 — End: 2011-09-15
  Administered 2011-09-15: 14:00:00 via ORAL

## 2011-09-15 MED ORDER — HYDROCODONE-ACETAMINOPHEN 10 MG-325 MG TAB
10-325 mg | ORAL_TABLET | ORAL | Status: AC | PRN
Start: 2011-09-15 — End: ?

## 2011-09-15 MED ORDER — OXYCODONE-ACETAMINOPHEN 7.5 MG-325 MG TAB
ORAL_TABLET | ORAL | Status: AC | PRN
Start: 2011-09-15 — End: ?

## 2011-09-15 NOTE — Other (Signed)
Awake on walking rounds voicing no complaints at this time. Drsg is clean, dry and intact. CMS WNL. OOB to recliner chair CB/telephone at hand.

## 2011-09-15 NOTE — Progress Notes (Signed)
Ortho    Pt. Seen and evaluated.  Doing well, pain well controlled  Denies cp, sob, abd pain    BP 115/74   Pulse 76   Temp 97.9 ??F (36.6 ??C)   Resp 18   Ht 5\' 10"  (1.778 m)   Wt 72.292 kg (159 lb 6 oz)   BMI 22.87 kg/m2   SpO2 94%    left total hip replacement  left hip Woundclean, dry, no drainage  Sensory intact to LT  Motor intact  nv intact  Neg calf tenderness.    Labs.  CBC  @  CBC:   Lab Results   Component Value Date/Time    WBC 10.1 09/15/2011  1:10 AM    RBC 3.42 09/15/2011  1:10 AM    HGB 10.9 09/15/2011  1:10 AM    HCT 33.4 09/15/2011  1:10 AM    PLATELET 230 09/15/2011  1:10 AM    and BMP:   Lab Results   Component Value Date/Time    Glucose 88 09/15/2011  1:10 AM    Sodium 139 09/15/2011  1:10 AM    Potassium 3.9 09/15/2011  1:10 AM    Chloride 106 09/15/2011  1:10 AM    CO2 31 09/15/2011  1:10 AM    BUN 10 09/15/2011  1:10 AM    Creatinine 0.8 09/15/2011  1:10 AM    Calcium 8.0 09/15/2011  1:10 AM   @  Coagulation  Lab Results   Component Value Date    INR 1.4* 09/15/2011    APTT 29.8 08/30/2011      Basic Metabolic Profile  Lab Results   Component Value Date    NA 139 09/15/2011    CO2 31 09/15/2011    BUN 10 09/15/2011         Assesment:leftOrthopedic / Rheumatologic: Total Hip Replacement    Plan:  Coumadin, PT, home today

## 2011-09-15 NOTE — Progress Notes (Signed)
Progress Note    Patient: [name] [name]               Sex: male          DOA: 09/13/2011       Date of Birth:  1946/03/02      Age:  65 y.o.        LOS:  LOS: 2 days           Subjective:     [name] [name] is a 65 y.o. year old male who is being seen for COPD seizure disorder He denies any unusual CNS   Activity and denies C/P cough & SOB.    Objective:      Vital Signs:  Visit Vitals   Item Reading   ??? BP 115/74   ??? Pulse 76   ??? Temp 97.9 ??F (36.6 ??C)   ??? Resp 18   ??? Ht 5\' 10"  (1.778 m)   ??? Wt 72.292 kg (159 lb 6 oz)   ??? BMI 22.87 kg/m2   ??? SpO2 94%       Physical Exam:  HEENT:WNL  Neck:supple  Cardiac:reg rhythm no M no G   Lungs:no accessory muscle use no wheeze rale or rhonchi  Abdomen:soft nontender   Skin:no rash  Neuro:alert oriented        Intake and Output:  Last three shifts:  10/16 1900 - 10/18 0659  In: 2330 [P.O.:2330]  Out: 675 [Urine:675]    Lab Results:  Recent Results (from the past 24 hour(s))   METABOLIC PANEL, COMPREHENSIVE    Collection Time    09/15/11  1:10 AM       Component Value Range    Sodium 139  136 - 145 MMOL/L    Potassium 3.9  3.5 - 5.5 MMOL/L    Chloride 106  100 - 108 MMOL/L    CO2 31  21 - 32 MMOL/L    Anion gap 2 (*) 5 - 15 mmol/L    Glucose 88  74 - 99 MG/DL    BUN 10  7 - 18 MG/DL    Creatinine 0.8  0.6 - 1.3 MG/DL    BUN/Creatinine ratio 13  12 - 20      GFR est AA >60  >60 ml/min/1.88m2    GFR est non-AA >60  >60 ml/min/1.57m2    Calcium 8.0 (*) 8.4 - 10.4 MG/DL    Bilirubin, total 0.2  0.2 - 1.0 MG/DL    ALT 23 (*) 30 - 65 U/L    AST 19  15 - 37 U/L    Alk. phosphatase 159 (*) 50 - 136 U/L    Protein, total 5.1 (*) 6.4 - 8.2 g/dL    Albumin 2.4 (*) 3.4 - 5.0 g/dL    Globulin 2.7  2.0 - 4.0 g/dL    A-G Ratio 0.9  0.8 - 1.7     CBC WITH AUTOMATED DIFF    Collection Time    09/15/11  1:10 AM       Component Value Range    WBC 10.1  4.6 - 13.2 K/uL    RBC 3.42 (*) 4.70 - 5.50 M/uL    HGB 10.9 (*) 13.0 - 16.0 g/dL    HCT 16.1 (*) 09.6 - 48.0 %    MCV 97.7 (*) 74.0 - 97.0 FL     MCH 31.9  24.0 - 34.0 PG    MCHC 32.6  31.0 - 37.0 g/dL  RDW 13.5  11.6 - 14.5 %    PLATELET 230  135 - 420 K/uL    MPV 10.4  9.2 - 11.8 FL    NEUTROPHILS 69  40 - 73 %    LYMPHOCYTES 17 (*) 21 - 52 %    MONOCYTES 12 (*) 3 - 10 %    EOSINOPHILS 2  0 - 5 %    BASOPHILS 0  0 - 2 %    ABS. NEUTROPHILS 6.9  1.8 - 8.0 K/UL    ABS. LYMPHOCYTES 1.7  0.9 - 3.6 K/UL    ABS. MONOCYTES 1.3 (*) 0.05 - 1.2 K/UL    ABS. EOSINOPHILS 0.2  0.0 - 0.4 K/UL    ABS. BASOPHILS 0.0  0.0 - 0.1 K/UL    DF AUTOMATED     PROTHROMBIN TIME    Collection Time    09/15/11  1:10 AM       Component Value Range    Prothrombin time 17.0 (*) 11.5 - 15.2 sec    INR 1.4 (*) 0.0 - 1.2         Images:  No images are attached to the encounter or orders placed in the encounter.    Medications:  Current Facility-Administered Medications   Medication Dose Route Frequency   ??? warfarin (COUMADIN) tablet 7.5 mg  7.5 mg Oral ONCE   ??? WARFARIN INFORMATION NOTE (COUMADIN)   Other PRN   ??? warfarin (COUMADIN) tablet 10 mg  10 mg Oral ONCE   ??? albuterol (PROVENTIL HFA, VENTOLIN HFA) inhaler 2 Puff  2 Puff Inhalation Q4H PRN   ??? carBAMazepine XR (TEGRETOL XR) tablet 200 mg  200 mg Oral BID   ??? tiotropium (SPIRIVA) inhalation capsule 18 mcg  1 Cap Inhalation DAILY   ??? 0.9% sodium chloride infusion  100 mL/hr IntraVENous CONTINUOUS   ??? acetaminophen (TYLENOL) tablet 650 mg  650 mg Oral Q4H PRN   ??? celecoxib (CELEBREX) capsule 200 mg  200 mg Oral BID   ??? oxyCODONE-acetaminophen (PERCOCET 7.5) 7.5-325 mg per tablet 1-2 Tab  1-2 Tab Oral Q4H PRN   ??? HYDROmorphone (PF) (DILAUDID) injection 1 mg  1 mg IntraVENous Q3H PRN   ??? naloxone (NARCAN) injection 0.4 mg  0.4 mg IntraVENous PRN   ??? ferrous sulfate tablet 325 mg  1 Tab Oral BID WITH MEALS   ??? ondansetron (ZOFRAN) injection 4 mg  4 mg IntraVENous Q4H PRN   ??? prochlorperazine (COMPAZINE) tablet 10 mg  10 mg Oral TID PRN   ??? diphenhydrAMINE (BENADRYL) injection 12.5 mg  12.5 mg IntraVENous Q6H PRN    ??? magnesium hydroxide (MILK OF MAGNESIA) oral suspension 30 mL  30 mL Oral DAILY PRN   ??? docusate sodium (COLACE) capsule 100 mg  100 mg Oral BID   ??? bisacodyl (DULCOLAX) tablet 5 mg  5 mg Oral DAILY PRN   ??? zolpidem (AMBIEN) tablet 5 mg  5 mg Oral QHS PRN   ??? oxyCODONE CR (OXYCONTIN) tablet 20 mg  20 mg Oral Q12H   ??? pregabalin (LYRICA) capsule 75 mg  75 mg Oral BID   ??? nicotine (NICODERM CQ) 21 mg/24 hr patch 1 Patch  1 Patch TransDERmal DAILY   ??? albuterol (PROVENTIL VENTOLIN) nebulizer solution 2.5 mg  2.5 mg Nebulization Q4H PRN       Assessment:   Mild acute bld loss anemia hemodynamically stable  COPD and siezure disorder good control    Problem List:  Active Problems:   *  No active hospital problems. *       Plan:     Maintenance meds    Brandan Glauber Jerolyn Shin, MD  September 15, 2011  10:02 AM

## 2011-09-15 NOTE — Discharge Summary (Signed)
09/13/2011  5:40 AM    09/15/2011, 8:17 AM    Primary ZO:XWRU Orthopedic / Rheumatologic: Total Hip Replacement  Secondary Dx: Etiological Diagnoses: none    HPI:  Pt has end stage OA and had failed conservative treatment.  Due to the current findings and affected activity of daily living surgical intervention is indicated.  The alternatives, risks, complications as well as expected outcome were discussed, the patient understands and wishes to proceed with surgery    Past Medical History   Diagnosis Date   ??? COPD    ??? Unspecified adverse effect of anesthesia      WHEN SPINAL WORE OFF, WAS IN EXCRUITIATING PAIN   ??? Seizure        @HOMEMEDS @    Review of patient's allergies indicates no known allergies.    Physical Exam:  General A&O x3 NAD, well developed, well nourished, normal affect  Heart: S1-S2, RRR  Lungs: CTA Bilat  Abd: soft NT, ND  Ext: n/v intact    Hospital Course:    Pt. Had leftOrthopedic / Rheumatologic: Total Hip Replacement    Post -op Course:  The patient tolerated the procedure well.  They were followed by internal medicine for help with medical management.  Pt. Was place on Abx pre and post-op for prophylaxis against infection as well as coumadin pre and post-op for prophylaxis against DVT.    Vitals signs remained stable, remained af.  The wound wasclean, dry, no drainage.  Pain was well controlled.  Pt. Had negative calf tenderness or swelling, no evidence for DVT.  Patient had PT/OT consult for evaluation and treatment.    CBC  Lab Results   Component Value Date/Time    WBC 10.1 09/15/2011  1:10 AM    RBC 3.42* 09/15/2011  1:10 AM    HCT 33.4* 09/15/2011  1:10 AM    MCV 97.7* 09/15/2011  1:10 AM    MCH 31.9 09/15/2011  1:10 AM    MCHC 32.6 09/15/2011  1:10 AM    RDW 13.5 09/15/2011  1:10 AM     Coagulation  Lab Results   Component Value Date    INR 1.4* 09/15/2011    APTT 29.8 08/30/2011      Basic Metabolic Profile  Lab Results   Component Value Date    NA 139 09/15/2011    CO2 31 09/15/2011     BUN 10 09/15/2011       Discharge Plan:  The patient will be d/c'd to home with hh pt, total hip protocol, pt/inr mon/thurs, WBAT.  Follow up with Dr. Meyer Cory in 10-12 days.  Call with any questions or concerns.

## 2011-09-15 NOTE — Progress Notes (Signed)
[name] [name] at [facility] notified that patient is being discharged today.

## 2011-09-15 NOTE — Progress Notes (Signed)
Problem: Mobility Impaired (Adult and Pediatric)  Goal: *Acute Goals and Plan of Care (Insert Text)  Patient will sup<->sit (S);  Sit<->stand with (A) device (S);  Ambulate >50??? with (A) device (S);  Up/down 2 stairs with CGA   Outcome: Progressing Towards Goal  PHYSICAL THERAPY TREATMENT    Patient: [name] [name] (65 y.o. male)  Date: 09/15/2011  Diagnosis: END STAGE OSTEOARTHRITIS LEFT HIP  END STAGE OSTEOARTHRITIS LEFT HIP <principal problem not specified>  Procedure(s) (LRB):  HIP ARTHROPLASTY TOTAL ANTERIOR APPROACH (Left) 2 Days Post-Op  Precautions: Total hip;WBAT  Chart, physical therapy assessment, plan of care and goals were reviewed.      ASSESSMENT:   Patient Independent with all aspects of mobility with RW in the acute care setting. Recommend HH PT to continue with therapy.  Progression toward goals:  [X]       Improving appropriately and progressing toward goals  [ ]       Improving slowly and progressing toward goals  [ ]       Not making progress toward goals and plan of care will be adjusted       PLAN:   Patient continues to benefit from skilled intervention to address the above impairments. Continue treatment per established plan of care.  Discharge Recommendations:  Home Health  Further Equipment Recommendations for Discharge:  Pt has RW and BSC at home       SUBJECTIVE:   Patient stated ???I am a [place]granahan sore today.???      OBJECTIVE DATA SUMMARY:   Critical Behavior:  Neurologic State: Alert  Orientation Level: Oriented X4  Cognition: Follows commands  Safety/Judgement: Awareness of environment;Good awareness of safety precautions  Functional Mobility Training:  Bed Mobility:  Rolling: Independent  Supine to Sit: Independent  Sit to Supine: Independent  Scooting: Independent  Transfers:  Sit to Stand: Independent  Stand to Sit: Independent  Bed to Chair: Modified independence, requires equipment (using RW)  Balance:  Sitting: Intact  Standing: Intact  Standing - Static: Good   Standing - Dynamic : Good  Ambulation/Gait Training:  Distance (ft):  (Pt is up in hallways I'ly distances >130ft)  Assistive Device: Walker, rolling  Ambulation - Level of Assistance: Modified independent (with RW)  Stairs:  Stairs - Level of Assistance:  (Verbally reviewed stair sequencing)  Therapeutic Exercises:   Verbally reviewed THR exercises and precautions.  Pain:  Pain Scale 1: Numeric (0 - 10)  Pain Intensity 1: 6  Pain Location 1: Hip  Pain Orientation 1: Left  Pain Description 1: Aching  Pain Intervention(s) 1: Refused  Activity Tolerance:   Very good  Please refer to the flowsheet for vital signs taken during this treatment.  After treatment:   [X]  Patient left in no apparent distress sitting up in chair  [ ]  Patient left in no apparent distress in bed  [X]  Call bell left within reach  [X]  Nursing notified  [ ]  Caregiver present  [ ]  Bed alarm activated      Alphonzo Severance, PTA   Time Calculation: 15 mins

## 2011-09-15 NOTE — Other (Signed)
Charletta Cousin, RN discussed discharge instructions with patient, understanding was verbalized. SL was removed with tip intact.Left the unit via wheelchair accompanied his daughter and Publishing copy.

## 2011-09-15 NOTE — Progress Notes (Signed)
Problem: Self Care Deficits Care Plan (Adult)  Goal: *Acute Goals and Plan of Care (Insert Text)  Occupational Therapy Goals  Initiated 09/14/2011 within 7 day(s).    1. Patient will perform bathing with modified independence.  2. Patient will perform lower body dressing with modified independence.  3. Patient will perform toilet transfers with modified independence.  4. Patient will perform all aspects of toileting with modified independence.  Outcome: Progressing Towards Goal  OCCUPATIONAL THERAPY TREATMENT    Patient: [name] [name] (65 y.o. male)  Date: 09/15/2011  Diagnosis: END STAGE OSTEOARTHRITIS LEFT HIP  END STAGE OSTEOARTHRITIS LEFT HIP <principal problem not specified>  Procedure(s) (LRB):  HIP ARTHROPLASTY TOTAL ANTERIOR APPROACH (Left) 2 Days Post-Op  Precautions: Total hip;WBAT  Chart, occupational therapy assessment, plan of care, and goals were reviewed.      ASSESSMENT:   Pt still has AE from previous hip sx, therefore none issued.  Progression toward goals:  [X]           Improving appropriately and progressing toward goals  [ ]           Improving slowly and progressing toward goals  [ ]           Not making progress toward goals and plan of care will be adjusted       PLAN:   Patient continues to benefit from skilled intervention to address the above impairments. Continue treatment per established plan of care.  Discharge Recommendations:  To Be Determined  Further Equipment Recommendations for Discharge:  N/A       SUBJECTIVE:   Patient stated ???I'm waiting for my mother in law to come get me soon.???      OBJECTIVE DATA SUMMARY:   Cognitive/Behavioral Status:  Neurologic State: Alert  Orientation Level: Oriented X4  Cognition: Follows commands  Safety/Judgement: Awareness of environment;Good awareness of safety precautions  Functional Mobility and Transfers for ADLs:              Bed Mobility:  Rolling: Independent  Supine to Sit: Independent  Sit to Supine: Independent  Scooting: Independent               Transfers:  Sit to Stand: Independent     Bed to Chair: Modified independence, requires equipment              Toilet Transfer : Modified independent               Balance:  Sitting: Intact  Standing: Intact  Standing - Static: Good  Standing - Dynamic : Good  ADL Intervention:  Basic ADL  Feeding: Independent  Oral Facial Hygiene/Grooming: Independent  Upper Body Dressing: Independent  Lower Body Dressing: Contact guard assistance;Adaptive equipment;Additional time  Toileting: Modified independent    Pain:  Pain Scale 1: Numeric (0 - 10)  Pain Intensity 1: 6  Pain Location 1: Hip  Pain Orientation 1: Left  Pain Description 1: Aching  Pain Intervention(s) 1: Refused    Activity Tolerance:    good  Please refer to the flowsheet for vital signs taken during this treatment.  After treatment:   [X]   Patient left in no apparent distress sitting up in chair  [ ]   Patient left in no apparent distress in bed  [X]   Call bell left within reach  [ ]   Nursing notified  [ ]   Caregiver present  [ ]   Bed alarm activated    Altamese Dilling, COTA  Time Calculation: 15 mins

## 2011-10-03 NOTE — Progress Notes (Signed)
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  Pt in hospital x 2-3 days then discharged home.  Pt had HHPT 2 1/2 weeks.  Pt complains of pain with movement and stairs.  Pt unable to run, jump, squat, or make sudden turns with walking.  Pt has to sleep in supine position secondary to inability to turn on either side.  Surgery was an anterior approach.  Pain subsides with sitting.    OBJECTIVE    Physical Therapy Evaluation- Hip    Posture:  Forward trunk with ambulation.    Gait:  []  Normal    []  Abnormal    [x]  Antalgic    []  NWB    Device:SPC    Describe: WBOS, Trendelenberg gait left LE; step-to gait on stairs         ROM/Strength        AROM                     PROM        Strength (1-5)  Hip Left Right Left Right Left Right   Flexion 50 100   2 5   Extension         Abduction     2 5   Adduction     5 5   ER     3 4   IR     3 4   Knee Left Right Left Right Left Right   Extension     4 5   Flexion     4 5        Flexibility: []  Unable to assess at this time  Hamstrings:    (L) Tightness= []  WNL   []  Min   [x]  Mod   []  Severe    (R) Tightness= []  WNL   []  Min   [x]  Mod   []  Severe  Quadriceps:    (L) Tightness= []  WNL   []  Min   [x]  Mod   []  Severe    (R) Tightness= []  WNL   [x]  Min   []  Mod   []  Severe  Gastroc:    (L) Tightness= []  WNL   []  Min   []  Mod   []  Severe    (R) Tightness= []  WNL   []  Min   []  Mod   []  Severe                                  Palpation   []  Min  [x]  Mod  []  Severe    Location: anterior left thigh along incision line  []  Min  [x]  Mod  []  Severe    Location: lateral hip and ITB  []  Min  []  Mod  []   Severe    Location:      Patient Education: [x]  Review HEP    []  Progressed/Changed HEP based on:_   []  positioning   []  body mechanics   []  transfers   []  heat/ice application    Other Objective/Functional Measures: Pt given initial HEPand instructed in proper technique x 2 min.    ASSESSMENT  [x]   See Plan of Care  []   See progress note/recertification  []   Patient will continue to benefit from skilled therapy to address remaining functional deficits:     PLAN  []   Upgrade activities as tolerated     [x]   Continue plan of care  []   Discharge due to:_  []  Other:_      Lucia Harm, PT 10/03/2011  8:35 AM

## 2011-10-03 NOTE — Progress Notes (Signed)
[facility]  [address]  [place], [place]  [address]  Phone: [phone]      Fax:  [phone]      Plan of Care/ Statement of Necessity for Physical Therapy Services    [name] [name]  10/03/2011   [name] Lore, MD   Provider # [id number]                     Diagnosis: v43.64  Onset Date:09/13/11  Prior Hospitalization:2-3 days      Start of Care:10/03/2011  Prior Level of Function: Difficulty walking on various surfaces; Unable to lie on either side; Right THR  Comorbidities: N/A    The Plan of Care and following information is based on the information from the initial evaluation.    Assessment/ key information: Pt is a 65 y/o male who presents with complaints of left hip pain S/P Left THA on 09/13/11.  Pt exhibited decreased left hip flexion AROM of 50 degrees with pain.  Pt's left LE strength ranged from 2 to 4/5 with pain grossly.  Decreased flexibility noted in bilateral HS and quadriceps, with pain in left LE.  Pt ambulated using SPC and Trendelenberg gait with WBOS.  Pt is unable to drive, run, jump, squat, or make sudden turns with walking secondary to pain.  Pt sleeps in supine only secondary to pain with sidelying bilaterally.  Pt would benefit from skilled PT to increase AROM, strength, and stability in left hip to normalize gait without assistive device and perform all ADLs independently.    Problem List:   pain affecting function, decrease ROM, decrease strength, edema affecting function, impaired gait/ balance, decrease ADL/ functional abilitiies, decrease activity tolerance, decrease flexibility/ joint mobility, decrease transfer abilities and other LEFS 9/80     Treatment Plan may include any combination of the following:  Therapeutic exercise, Therapeutic activities, Neuromuscular re-education, Physical agent/modality, Gait/balance training, Manual therapy and Patient education     Patient / Family readiness to learn indicated by: asking questions, trying to perform skills and interest    Persons(s) to be included in education:   patient (P)    Barriers to Learning/Limitations: no    Patient Goal (s): "No pain."  Rehabilitation Potential: excellent    Short Term Goals: To be accomplished in 2 weeks  1) Pt to be independent and compliant with initial HEP to increase left hip mobility.  2) Pt to increase left hip flexion AROM by 5 degrees to assist in ease with transfers.  3) Pt to increase LEFS score by 7 pts to show increased function.    Long Term Goals: To be accomplished in 4 weeks   1) Pt to increase left hip flexion AROM by 10 degrees to increase hip flexion with gait.  2) Pt to increase left LE strength by 1 MMT grade to increase stability and independence with gait.  3) Pt to report 0-1/10 pain in left hip with ADLs to increase function.  4) Pt to increase LEFS score by 14 pts to show increased quality of life.     Frequency / Duration: Patient to be seen 3 times per week for 4 weeks:    Patient/ Caregiver education and instruction: exercises    Certification Period: N/A    [name]  Kamrin Sibley, PT 10/03/2011 9:12 AM    ________________________________________________________________________    I certify that the above Therapy Services are being furnished while the patient is under my care. I agree  with the treatment plan and certify that this therapy is necessary.  Y or N I have read the above and request that my patient continue as recommended.  Y or N I have read the above report and request that my patient continue therapy with the following changes/special instructions:  Y or N I have read the above report and request that my patient be discharged from therapy    Physician's Signature:____________________  Date:________________    Please sign and return to [facility] at [facility] or fax to:   [phone].

## 2011-10-05 NOTE — Progress Notes (Signed)
PT DAILY TREATMENT NOTE     Patient Name: [name] [name]  Date:10/05/2011  DOB: 12-28-1945  [x]   Patient DOB Verified  Payor: BLUE CROSS  [place]  Product Type: PPO     In time:8:55  Out time:9:40  Total Treatment Time (min): 45  Total Timed Codes (min): 45  1:1 Treatment Time (MC only): N/A   Visit #: 2 of 12    Treatment Area: Left THR    SUBJECTIVE  Pain Level (0-10 scale): 6-7/10  Any medication changes, allergies to medications, adverse drug reactions, diagnosis change, or new procedure performed?: [x]  No    []  Yes (see summary sheet for update)  Subjective functional status/changes:   []  No changes reported  "If I'm sitting I feel fine but when I start moving around, the pain increases."    OBJECTIVE    Therapeutic Exercise: [x]  see flow sheet     []  Other:_ (minutes) : 37  Added/Changed Exercises:  []   Added:_  to improve (function):  []   Changed:_ to improve (function):    Manual Therapy: STM to left ITB with massage stick (minutes) : 8    Patient Education: [x]  Review HEP    []  Progressed/Changed HEP based on:_   []  positioning   []  body mechanics   []  transfers   []  heat/ice application   (minutes) :    Other Objective/Functional Measures: Initiated exercises per flow sheet today.  Pt reported some pain with hip hikes on left side.  Pt sensitive to manual therapy but decrease in TrPs noted after.  No change in pain level after session.    Pain Level (0-10 scale) post treatment: 6/10    ASSESSMENT  []   See Plan of Care  []   See progress note/recertification  [x]   Patient will continue to benefit from skilled therapy to address remaining functional deficits: left LE weakness, decrease hip flexion AROM, pain with ambulation    Progress towards goals: Continue per POC.      PLAN  []   Upgrade activities as tolerated     [x]   Continue plan of care  []   Discharge due to:_  []  Other:_        Cosimo Schertzer, PT       10/05/2011        9:48 AM

## 2011-10-07 NOTE — Progress Notes (Signed)
PT DAILY TREATMENT NOTE     Patient Name: [name] [name]  Date:10/07/2011  DOB: 1946/10/06  [x]   Patient DOB Verified  Payor: BLUE CROSS  [place]  Product Type: PPO     In time:9:25  Out time: 10:00  Total Treatment Time (min): 35  Total Timed Codes (min): 35  1:1 Treatment Time (MC only): N/A   Visit #: 4 of 12    Treatment Area: Left THR    SUBJECTIVE  Pain Level (0-10 scale): 0/10  Any medication changes, allergies to medications, adverse drug reactions, diagnosis change, or new procedure performed?: [x]  No    []  Yes (see summary sheet for update)  Subjective functional status/changes:   []  No changes reported  "If I'm sitting I feel fine but when I start moving around, the pain increases."    OBJECTIVE    Therapeutic Exercise: [x]  see flow sheet     []  Other:_ (minutes) : 35  Added/Changed Exercises:  []   Added:_  to improve (function):  []   Changed:_ to improve (function):    Patient Education: [x]  Review HEP    []  Progressed/Changed HEP based on:_   []  positioning   []  body mechanics   []  transfers   []  heat/ice application   (minutes) :    Other Objective/Functional Measures: Pt had no increased pain or difficulty with exercises today but did report some increased pain in left hip at end of session.    Pain Level (0-10 scale) post treatment: 4/10    ASSESSMENT  []   See Plan of Care  []   See progress note/recertification  [x]   Patient will continue to benefit from skilled therapy to address remaining functional deficits: left LE weakness, decrease hip flexion AROM, pain with ambulation    Progress towards goals: STG #1 met.      PLAN  []   Upgrade activities as tolerated     [x]   Continue plan of care  []   Discharge due to:_  []  Other:_        Christle Nolting, PT       10/07/2011        9:48 AM

## 2011-10-10 NOTE — Progress Notes (Signed)
PT DAILY TREATMENT NOTE     Patient Name: [name] [name]  Date:10/10/2011  DOB: 1946/06/09  [x]   Patient DOB Verified  Payor: BLUE CROSS  [place]: [place]  Product Type: PPO     In time:8:30  Out time: 9:00  Total Treatment Time (min): 30  Total Timed Codes (min): 30  1:1 Treatment Time (MC only): N/A   Visit #: 4 of 12    Treatment Area: Left THR    SUBJECTIVE  Pain Level (0-10 scale): 0/10( 6-7/10 when walking)  Any medication changes, allergies to medications, adverse drug reactions, diagnosis change, or new procedure performed?: [x]  No    []  Yes (see summary sheet for update)  Subjective functional status/changes:   []  No changes reported  "I just have pain when I walk, and a lot of pain when I turn corners."    OBJECTIVE    Therapeutic Exercise: [x]  see flow sheet     []  Other:_ (minutes) : 22  Added/Changed Exercises:  [x]   Added:RTB with mini squats.  to improve (function):  []   Changed:_ to improve (function):    Manual Therapy: STM to left ITB with massage stick (minutes) : 8    Patient Education: [x]  Review HEP    []  Progressed/Changed HEP based on:_   []  positioning   []  body mechanics   []  transfers   []  heat/ice application   (minutes) :    Other Objective/Functional Measures: Pt had no increased pain or difficulty with exercises today.    Pain Level (0-10 scale) post treatment: 0/10    ASSESSMENT  []   See Plan of Care  []   See progress note/recertification  [x]   Patient will continue to benefit from skilled therapy to address remaining functional deficits: left LE weakness, decrease hip flexion AROM, pain with ambulation    Progress towards goals: Continue towards all STGs.      PLAN  []   Upgrade activities as tolerated     [x]   Continue plan of care  []   Discharge due to:_  []  Other:_        Minus Breeding, PTA       10/10/2011        9:48 AM

## 2011-10-12 NOTE — Progress Notes (Signed)
PT DAILY TREATMENT NOTE     Patient Name: [name] [name]  Date:10/12/2011  DOB: Mar 04, 1946  [x]   Patient DOB Verified  Payor: BLUE CROSS  [place]  Product Type: PPO     In time:8:30  Out time: 9:06  Total Treatment Time (min): 36  Total Timed Codes (min): 36  1:1 Treatment Time (MC only): N/A   Visit #: 5 of 12    Treatment Area: Left THR    SUBJECTIVE  Pain Level (0-10 scale): 0/10( 4-5/10 when walking)  Any medication changes, allergies to medications, adverse drug reactions, diagnosis change, or new procedure performed?: [x]  No    []  Yes (see summary sheet for update)  Subjective functional status/changes:   [x]  No changes reported  "It's getting better I think."    OBJECTIVE    Therapeutic Exercise: [x]  see flow sheet     []  Other:_ (minutes) : 26  Added/Changed Exercises:  []   Added:  to improve (function):  [x]   Changed: Increased reps; Increased to 6" step to improve (function): strength and stability    Manual Therapy: STM to left ITB with massage stick (minutes) : 10    Patient Education: [x]  Review HEP    []  Progressed/Changed HEP based on:_   []  positioning   []  body mechanics   []  transfers   []  heat/ice application   (minutes) :    Other Objective/Functional Measures: Pt reported difficulty and some discomfort with hip hikes.  Pt still unable to perform SLR exercises supine secondary to pain and weakness so does in S/L position.  Pain Level (0-10 scale) post treatment: 0/10 (4-5/10 with movement)    ASSESSMENT  []   See Plan of Care  []   See progress note/recertification  [x]   Patient will continue to benefit from skilled therapy to address remaining functional deficits: left LE weakness, decrease hip flexion AROM, pain with ambulation    Progress towards goals: Will reassess goals next visit.      PLAN  []   Upgrade activities as tolerated     [x]   Continue plan of care  []   Discharge due to:_  [x]  Other: Goals next visit        Deloyd Handy, PT       10/12/2011        9:48 AM

## 2011-10-14 NOTE — Progress Notes (Signed)
[facility]  [address]  [place], [place]  [address]  Phone: [phone]      Fax:  [phone]    Progress Note    Name: [name] [name]   DOB: 1946-08-28   MD: Luna Fuse, PA       Treatment Diagnosis: v43.64   Start of Care: 10/03/11  Visits from Start of Care: 6  Missed Visits: 0    Summary of Care: Pt is making good progress with therapy.  He rates his pain at 0/10 when sitting and 6/10 when walking.  Pt is now independent and compliant with his HEP.  Pt's left hip flexion AROM increased to 60 degrees from 50 degrees at evaluation.  Pt's LEFS score increased to 19/80 from 9/80 at evaluation, showing increased function with less pain.      Assessment / Recommendations: Pt to continue skilled therapy to meet the following goals:  1)Pt to increase left hip flexion AROM by 5 degrees to assist in ease with transfers.  2)Pt to increase left LE strength by 1 MMT grade to increase stability and independence with gait.  3)Pt to report 0-1/10 pain in left hip with ADLs to increase function.    Pt to continue therapy 3xs a week for 4 more weeks.        Minus Breeding, PTA 10/14/2011 8:33 AM    ________________________________________________________________________  NOTE TO PHYSICIAN:  Please complete the following and fax to:   [facility] at [phone]  . Retain this original for your records.  If you are unable to process this request in 24 hours, please contact our office.       ____ I have read the above report and request that my patient continue therapy with the following changes/special instructions:  ____ I have read the above report and request that my patient be discharged from therapy    Physician's Signature:_________________ Date:___________Time:__________

## 2011-10-14 NOTE — Progress Notes (Addendum)
PT DAILY TREATMENT NOTE     Patient Name: [name] [name]  Date:10/14/2011  DOB: Dec 17, 1945  [x]   Patient DOB Verified  Payor: BLUE CROSS  [place]: [place]  Product Type: PPO     In time:9:35  Out time: 10:05  Total Treatment Time (min): 30  Total Timed Codes (min): 30  1:1 Treatment Time (MC only): N/A   Visit #: 6 of 12    Treatment Area: Left THR    SUBJECTIVE  Pain Level (0-10 scale): 0/10  Any medication changes, allergies to medications, adverse drug reactions, diagnosis change, or new procedure performed?: [x]  No    []  Yes (see summary sheet for update)  Subjective functional status/changes:   [x]  No changes reported      OBJECTIVE    Therapeutic Exercise: [x]  see flow sheet     []  Other:_ (minutes) : 20  Added/Changed Exercises:  []   Added:  to improve (function):  []   Changed:  to improve (function): strength and stability    Manual Therapy: STM to left ITB with massage stick (minutes) : 10    Patient Education: [x]  Review HEP    []  Progressed/Changed HEP based on:_   []  positioning   []  body mechanics   []  transfers   []  heat/ice application   (minutes) :    Other Objective/Functional Measures: LEFS  19/80, left hip flexion 60 degrees.  Pain Level (0-10 scale) post treatment: 0/10     ASSESSMENT  []   See Plan of Care  []   See progress note/recertification  [x]   Patient will continue to benefit from skilled therapy to address remaining functional deficits: left LE weakness, decrease hip flexion AROM, pain with ambulation    Progress towards goals: All STGs have been met.      PLAN  []   Upgrade activities as tolerated     [x]   Continue plan of care  []   Discharge due to:_  []  Other:       Minus Breeding, PTA       10/14/2011        9:48 AM

## 2011-10-31 NOTE — Progress Notes (Signed)
PT DAILY TREATMENT NOTE     Patient Name: [name] [name]  Date:10/31/2011  DOB: 01/04/1946  [x]   Patient DOB Verified  Payor: BLUE CROSS  [place]  Product Type: PPO     In time:9:30 Out time: 10:00  Total Treatment Time (min): 30  Total Timed Codes (min): 30  1:1 Treatment Time (MC only): N/A   Visit #: 7 of 12    Treatment Area: Left THR    SUBJECTIVE  Pain Level (0-10 scale): 0/10  Any medication changes, allergies to medications, adverse drug reactions, diagnosis change, or new procedure performed?: [x]  No    []  Yes (see summary sheet for update)  Subjective functional status/changes:   []  No changes reported      OBJECTIVE    Therapeutic Exercise: [x]  see flow sheet     []  Other:_ (minutes) : 30  Added/Changed Exercises:  []   Added:  to improve (function):  []   Changed:  to improve (function): strength and stability    Manual Therapy:  (minutes) :     Patient Education: [x]  Review HEP    []  Progressed/Changed HEP based on:_   []  positioning   []  body mechanics   []  transfers   []  heat/ice application   (minutes) :    Other Objective/Functional Measures: Pt. Did not report any pain or discomfort with therapy.  Pain Level (0-10 scale) post treatment: 0/10     ASSESSMENT  []   See Plan of Care  []   See progress note/recertification  [x]   Patient will continue to benefit from skilled therapy to address remaining functional deficits: left LE weakness, decrease hip flexion AROM, pain with ambulation    Progress towards goals:  Pt is progressing towards LTGs.      PLAN  []   Upgrade activities as tolerated     [x]   Continue plan of care  []   Discharge due to:_  []  Other:       Minus Breeding, PTA       10/31/2011        9:48 AM

## 2011-11-02 NOTE — Progress Notes (Signed)
PT DAILY TREATMENT NOTE     Patient Name: [name] [name]  Date:11/02/2011  DOB: 1946/09/11  [x]   Patient DOB Verified  Payor: BLUE CROSS  [place]  Product Type: PPO     In time:9:00 Out time: 9:45  Total Treatment Time (min): 45  Total Timed Codes (min): 45  1:1 Treatment Time (MC only): N/A   Visit #: 2 of 12    Treatment Area: Left THR    SUBJECTIVE  Pain Level (0-10 scale): 0/10  Any medication changes, allergies to medications, adverse drug reactions, diagnosis change, or new procedure performed?: [x]  No    []  Yes (see summary sheet for update)  Subjective functional status/changes:   []  No changes reported  "If I'm sitting I have no pain.  If I am walking its about 5-6/10 pain."    OBJECTIVE    Therapeutic Exercise: [x]  see flow sheet     []  Other:_ (minutes) : 35  Added/Changed Exercises:  [x]   Added:  2# with exercises to improve (function):strength  [x]   Changed:  Step up plus; Increased marches to 1 min to improve (function): strength and stability    Manual Therapy:  TPR to left ITB with massage stick    (minutes) : 10     Patient Education: [x]  Review HEP    []  Progressed/Changed HEP based on:_   []  positioning   []  body mechanics   []  transfers   []  heat/ice application   (minutes) :    Other Objective/Functional Measures: Pt had no increased pain with exercises.  Pt reported decrease in muscle tightness at end of session after manual therapy.  Pt ambulated with decreased antalgic limp.    Pain Level (0-10 scale) post treatment: 0/10     ASSESSMENT  []   See Plan of Care  []   See progress note/recertification  [x]   Patient will continue to benefit from skilled therapy to address remaining functional deficits: left LE weakness, decrease hip flexion AROM, pain with ambulation    Progress towards goals:  Pt progressing well.  Pt ambulating without AD and with minimal antalgic limp.      PLAN  []   Upgrade activities as tolerated     [x]   Continue plan of care  []   Discharge due to:_   []  Other:       Joel Mericle, PT       11/02/2011        9:48 AM

## 2011-11-04 NOTE — Progress Notes (Signed)
PT DAILY TREATMENT NOTE     Patient Name: [name] [name]  Date:11/04/2011  DOB: 07-Oct-1946  [x]   Patient DOB Verified  Payor: BLUE CROSS  [place]  Product Type: PPO     In time:9:30 Out time: 10:16  Total Treatment Time (min): 46  Total Timed Codes (min): 46  1:1 Treatment Time (MC only): N/A   Visit #: 3 of 12    Treatment Area: Left THR    SUBJECTIVE  Pain Level (0-10 scale): 5/10  Any medication changes, allergies to medications, adverse drug reactions, diagnosis change, or new procedure performed?: [x]  No    []  Yes (see summary sheet for update)  Subjective functional status/changes:   []  No changes reported  "I'm so sore today.  Maybe I slept wrong."    OBJECTIVE    Therapeutic Exercise: [x]  see flow sheet     []  Other:_ (minutes) : 38  Added/Changed Exercises:  []   Added:  to improve (function):  []   Changed:  to improve (function):     Manual Therapy:  TPR to left ITB with massage stick    (minutes) : 8     Patient Education: [x]  Review HEP    []  Progressed/Changed HEP based on:_   []  positioning   []  body mechanics   []  transfers   []  heat/ice application   (minutes) :    Other Objective/Functional Measures: Pt reported feeling the same as he did coming in.  Pt reported feeling more stiff but okay.    Pain Level (0-10 scale) post treatment: 5/10     ASSESSMENT  []   See Plan of Care  []   See progress note/recertification  [x]   Patient will continue to benefit from skilled therapy to address remaining functional deficits: left LE weakness, decrease hip flexion AROM, pain with ambulation    Progress towards goals:  Pt progressing well.  Pt ambulating without AD and with minimal antalgic limp.      PLAN  []   Upgrade activities as tolerated     [x]   Continue plan of care  []   Discharge due to:_  []  Other:       Iosefa Weintraub, PT       11/04/2011        9:48 AM

## 2011-11-07 NOTE — Progress Notes (Signed)
PT DAILY TREATMENT NOTE     Patient Name: [name] [name]  Date:11/07/2011  DOB: 15-Mar-1946  [x]   Patient DOB Verified  Payor: BLUE CROSS  [place]  Product Type: PPO     In time:8:30 Out time: 9:08  Total Treatment Time (min): 38  Total Timed Codes (min): 38  1:1 Treatment Time (MC only): N/A   Visit #: 4 of 12    Treatment Area: Left THR    SUBJECTIVE  Pain Level (0-10 scale): 4-5/10  Any medication changes, allergies to medications, adverse drug reactions, diagnosis change, or new procedure performed?: [x]  No    []  Yes (see summary sheet for update)  Subjective functional status/changes:   []  No changes reported  "I'm sore in the hips today.  The right hip just feels different than the left hip.  I can't sleep on that side."    OBJECTIVE    Therapeutic Exercise: [x]  see flow sheet     []  Other:_ (minutes) : 30  Added/Changed Exercises:  []   Added:  to improve (function):  [x]   Changed: Increased to GTB; Increased reps per flow sheet to improve (function): strength and stability    Manual Therapy:  TPR to left ITB with massage stick    (minutes) : 8     Patient Education: [x]  Review HEP    []  Progressed/Changed HEP based on:_   []  positioning   []  body mechanics   []  transfers   []  heat/ice application   (minutes) :    Other Objective/Functional Measures: Pt corrected on technique with step downs.  Pt had no change in pain level at end of session, stating no real pain just soreness.    Pain Level (0-10 scale) post treatment: 4/10     ASSESSMENT  []   See Plan of Care  []   See progress note/recertification  [x]   Patient will continue to benefit from skilled therapy to address remaining functional deficits: left LE weakness, decrease hip flexion AROM, pain with ambulation    Progress towards goals:  Pt progressing well.  Pt ambulating without AD and with minimal antalgic limp.  Pt still complains of soreness with ambulation and lying on either side at night.      PLAN   []   Upgrade activities as tolerated     [x]   Continue plan of care  []   Discharge due to:_  []  Other:       Mildreth Reek, PT       11/07/2011        9:48 AM

## 2011-11-09 NOTE — Progress Notes (Signed)
PT DAILY TREATMENT NOTE     Patient Name: [name] [name]  Date:11/09/2011  DOB: 1946-10-19  [x]   Patient DOB Verified  Payor: BLUE CROSS  [place]: [place]  Product Type: PPO     In time:8:30 Out time: 9:37  Total Treatment Time (min): 37  Total Timed Codes (min): 37  1:1 Treatment Time (MC only): N/A   Visit #: 5 of 12    Treatment Area: Left THR    SUBJECTIVE  Pain Level (0-10 scale):  4-5/10  Any medication changes, allergies to medications, adverse drug reactions, diagnosis change, or new procedure performed?: [x]  No    []  Yes (see summary sheet for update)  Subjective functional status/changes:   []  No changes reported  "I'm sore in the hips today.  It's not any worse."    OBJECTIVE    Therapeutic Exercise: [x]  see flow sheet     []  Other:_ (minutes) : 37  Added/Changed Exercises:  [x]   Added:  Sidestep with TB; SLS on foam to improve (function): stability  [x]   Changed: Increased reps per flow sheet to improve (function): strength and stability    Manual Therapy:      (minutes) :     Patient Education: [x]  Review HEP    []  Progressed/Changed HEP based on:_   []  positioning   []  body mechanics   []  transfers   []  heat/ice application   (minutes) :    Other Objective/Functional Measures: Pt challenged with doing exercises with minimal to no UE support.  Pt reported decrease in symptoms at end of session.     Pain Level (0-10 scale) post treatment:  3-4/10     ASSESSMENT  []   See Plan of Care  []   See progress note/recertification  [x]   Patient will continue to benefit from skilled therapy to address remaining functional deficits: left LE weakness, decrease hip flexion AROM, pain with ambulation    Progress towards goals:  Pt progressing well.  Pt ambulating without AD and with minimal antalgic limp.  Pt still complains of soreness with ambulation and lying on either side at night.        PLAN  []   Upgrade activities as tolerated     [x]   Continue plan of care  []   Discharge due to:_   [x]  Other:  Goals next visit.      Hadeel Hillebrand, PT       11/09/2011        9:48 AM

## 2011-11-11 NOTE — Progress Notes (Signed)
PT DAILY TREATMENT NOTE     Patient Name: [name] [name]  Date:11/11/2011  DOB: 1946/07/26  [x]   Patient DOB Verified  Payor: BLUE CROSS  [place]: [place] OF [place]  Product Type: PPO     In time:8:00 Out time: 8:  Total Treatment Time (min):   Total Timed Codes (min):   1:1 Treatment Time (MC only): N/A   Visit #: 6 of 12    Treatment Area: Left THR    SUBJECTIVE  Pain Level (0-10 scale):  4/10  Any medication changes, allergies to medications, adverse drug reactions, diagnosis change, or new procedure performed?: [x]  No    []  Yes (see summary sheet for update)  Subjective functional status/changes:   [x]  No changes reported  "It's about the same."    OBJECTIVE    Therapeutic Exercise: [x]  see flow sheet     []  Other:_ (minutes) : 39  Added/Changed Exercises:  []   Added:  to improve (function):   [x]   Changed:  Increased to 3# to improve (function): strength    Manual Therapy:      (minutes) :     Patient Education: [x]  Review HEP    []  Progressed/Changed HEP based on:_   []  positioning   []  body mechanics   []  transfers   []  heat/ice application   (minutes) :    Other Objective/Functional Measures: Pt challenged with doing exercises with increased weight.  Pt exhibited decrease in tightness in left ITB.  Symptoms decreased at end of session.    Pain Level (0-10 scale) post treatment:  2/10     ASSESSMENT  []   See Plan of Care  []   See progress note/recertification  [x]   Patient will continue to benefit from skilled therapy to address remaining functional deficits: left LE weakness, decrease hip flexion AROM, pain with ambulation    Progress towards goals:  Pt progressing well.  Pt continues to ambulate without AD and with minimal antalgic limp.  Pt will be placed on hold until 12/12/11 secondary to going [place] of [place] for [place].  Pt independent with HEP to perform while gone.       PLAN  []   Upgrade activities as tolerated     [x]   Continue plan of care  []   Discharge due to:_  [x]  Other:  Goals next visit.       Nira Visscher, PT       11/11/2011        9:48 AM

## 2011-11-29 HISTORY — PX: OTHER SURGICAL HISTORY: SHX169

## 2011-12-12 NOTE — Progress Notes (Signed)
PT DAILY TREATMENT NOTE     Patient Name: [name] [name]  Date:12/12/2011  DOB: 1946-09-26  [x]   Patient DOB Verified  Payor: BLUE CROSS  [place]: [place]  Product Type: PPO     In time: 2:00 Out time: 2:23  Total Treatment Time (min): 23  Total Timed Codes (min):15   1:1 Treatment Time (MC only): N/A   Visit #: 7 of 12    Treatment Area: Left THR    SUBJECTIVE  Pain Level (0-10 scale):  0/10  Any medication changes, allergies to medications, adverse drug reactions, diagnosis change, or new procedure performed?: [x]  No    []  Yes (see summary sheet for update)  Subjective functional status/changes:   [x]  No changes reported  "I've been doing the exercises with 5# weights without real pain.  The worst the pain gets is 2/10 with walking.  I think I can do this at home now."    OBJECTIVE    Therapeutic Exercise: [x]  see flow sheet     []  Other:_ (minutes) : 0  Added/Changed Exercises:  []   Added:  to improve (function):   []   Changed:  Increased to 3# to improve (function): strength    Manual Therapy:      (minutes) :     Patient Education: [x]  Review HEP    []  Progressed/Changed HEP based on:_   []  positioning   []  body mechanics   []  transfers   []  heat/ice application   (minutes) :    Other Objective/Functional Measures: LEFS 44/80.  5/5 left LE strength all planes.  96 degrees left Hip flexion AROM.  8 min no charge secondary to waiting on therapist.    Pain Level (0-10 scale) post treatment:  0/10     ASSESSMENT  []   See Plan of Care  []   See progress note/recertification  [x]   Patient will continue to benefit from skilled therapy to address remaining functional deficits: left LE weakness, decrease hip flexion AROM, pain with ambulation    Progress towards goals:  Pt has met all goals, is independent with HEP, and can be D/C at this time.      PLAN  []   Upgrade activities as tolerated     [x]   Continue plan of care  [x]   Discharge due to: Program complete.  []  Other:        Jonahtan Manseau, PT        12/12/2011        9:48 AM

## 2011-12-12 NOTE — Progress Notes (Signed)
[facility]  [address]  [place], [place]  [address]  Phone: [phone]      Fax:  [phone]    Discharge Summary    Name: [name] [name]   DOB: 05/15/1946   MD: Gifford Shave, MD       Treatment Diagnosis: Hip joint replacement by other means [V43.64]   Start of Care: 10/03/11    Visits from Start of Care: 12  Missed Visits: 0    Summary of Care: Pt progressed well with therapy.  Pt's left Hip flexion AROM increased to 96 degrees from 50 degrees at evaluation.  Pt exhibits 5/5 left LE strength without pain.  Pt's LEFS score increased to 44/80 from 9/80 at initial evaluation.  Pt is ambulating without an A.D with minimal limp.  Pt has resumed all normal activities and reports max of 2/10 pain with walking now.  Pt is independent with HEP and requested to self-manage care at home.  Pt can be D/C at this time.    Assessment / Recommendations:   Discontinue therapy. Progressing towards or have reached established goals.    Lucretia Pendley, PT 12/12/2011 3:02 PM    ________________________________________________________________________  NOTE TO PHYSICIAN:  Please complete the following and fax to:   [facility] at [phone]  . Retain this original for your records.  If you are unable to process this request in 24 hours, please contact our office.       ____ I have read the above report and request that my patient continue therapy with the following changes/special instructions:  ____ I have read the above report and request that my patient be discharged from therapy    Physician's Signature:_________________ Date:___________Time:__________

## 2013-11-28 DIAGNOSIS — J984 Other disorders of lung: Secondary | ICD-10-CM

## 2013-11-28 HISTORY — DX: Other disorders of lung: J98.4

## 2014-01-22 ENCOUNTER — Encounter (INDEPENDENT_AMBULATORY_CARE_PROVIDER_SITE_OTHER): Payer: Self-pay

## 2014-01-22 ENCOUNTER — Encounter: Payer: Self-pay | Admitting: Internal Medicine

## 2014-01-22 ENCOUNTER — Ambulatory Visit (INDEPENDENT_AMBULATORY_CARE_PROVIDER_SITE_OTHER): Payer: Medicare Other | Admitting: Internal Medicine

## 2014-01-22 VITALS — BP 122/70 | HR 80 | Temp 97.9°F | Ht 70.0 in | Wt 154.6 lb

## 2014-01-22 DIAGNOSIS — R911 Solitary pulmonary nodule: Secondary | ICD-10-CM

## 2014-01-22 DIAGNOSIS — F17200 Nicotine dependence, unspecified, uncomplicated: Secondary | ICD-10-CM

## 2014-01-22 DIAGNOSIS — J449 Chronic obstructive pulmonary disease, unspecified: Secondary | ICD-10-CM | POA: Insufficient documentation

## 2014-01-22 MED ORDER — BUDESONIDE-FORMOTEROL FUMARATE 160-4.5 MCG/ACT IN AERO: INHALATION_SPRAY | RESPIRATORY_TRACT | Status: DC

## 2014-01-22 NOTE — Patient Instructions (Addendum)
Symbicort 160 Take 2 puffs first thing in am and then another 2 puffs about 12 hours later.   Spiriva each am  Only use your albuterol (proair) as a rescue medication to be used if you can't catch your breath by resting or doing a relaxed purse lip breathing pattern.  - The less you use it, the better it will work when you need it. - Ok to use up to 2 puffs  every 4 hours if you must but call for immediate appointment if use goes up over your usual need - Don't leave home without it !!  (think of it like the spare tire for your car)   The key is to stop smoking completely before smoking completely stops you!   Please schedule a follow up office visit in 4 weeks, sooner if needed with pfts and cxr

## 2014-01-22 NOTE — Progress Notes (Signed)
   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 [facility] rx with pred and avelox and finished it and feels like he's more nearly back to baseline = spiriva each am, but now feeling proair twice daily needed vs not at all needing saba before. Did have congested cough with thick yellow mucus, never bloody, but this has resolved.  Baseline = doe with inclines and if he gets in a hurry but not if he takes his time on a flat surface.  No obvious other patterns in day to day or daytime variabilty or assoc chronic cough or cp or chest tightness, subjective wheeze overt sinus or hb symptoms. No unusual exp hx or h/o childhood pna/ asthma or knowledge of premature birth.  Sleeping ok without nocturnal  or early am exacerbation  of respiratory  c/o's or need for noct saba. Also denies any obvious fluctuation of symptoms with weather or environmental changes or other aggravating or alleviating factors except as outlined above   Current Medications, Allergies, Complete Past Medical History, Past Surgical History, Family History, and Social History were reviewed in [employer] record.            Review of Systems  Constitutional: Positive for activity change and unexpected weight change. Negative for fever, chills and appetite change.  HENT: Positive for postnasal drip. Negative for congestion, dental problem, rhinorrhea, sneezing, sore throat, trouble  swallowing and voice change.   Eyes: Negative for visual disturbance.  Respiratory: Positive for cough and shortness of breath. Negative for choking.   Cardiovascular: Negative for chest pain and leg swelling.  Gastrointestinal: Negative for nausea, vomiting and abdominal pain.  Genitourinary: Negative for difficulty urinating.  Musculoskeletal: Negative for arthralgias.  Skin: Negative for rash.  Hematological: Bruises/bleeds easily.  Psychiatric/Behavioral: Negative for behavioral problems and confusion.       Objective:   Physical Exam  amb wm  Wt Readings from Last 3 Encounters:  01/22/14 154 lb 9.6 oz (70.126 kg)     HEENT mild turbinate edema.  Oropharynx no thrush or excess pnd or cobblestoning.  No JVD or cervical adenopathy. Mild accessory muscle hypertrophy. Trachea midline, nl thryroid. Chest was hyperinflated by percussion with diminished breath sounds and moderate increased exp time without wheeze. Hoover sign positive at mid inspiration. Regular rate and rhythm without murmur gallop or rub or increase P2 or edema.  Abd: no hsm, nl excursion. Ext warm without cyanosis or clubbing.    cxr 01/10/14  1. Hyperinflated lungs suggest underlying small airways disease with query some minimal edema or bronchitis. 2. Likely calcified nodule in the left lower lobe or lingula. 3. Degenerative changes of the thoracic spine. 4. Pleural spaces are within normal limits.       Assessment & Plan:

## 2014-01-23 DIAGNOSIS — R911 Solitary pulmonary nodule: Secondary | ICD-10-CM | POA: Insufficient documentation

## 2014-01-23 DIAGNOSIS — F17200 Nicotine dependence, unspecified, uncomplicated: Secondary | ICD-10-CM | POA: Insufficient documentation

## 2014-01-23 NOTE — Assessment & Plan Note (Addendum)
 >   3 min discussion  I emphasized that although we never turn away smokers from the pulmonary clinic, we do ask that they understand that the recommendations that we make  won't work nearly as well in the presence of continued cigarette exposure.  In fact, we may very well  reach a point where we can't promise to help the patient if he/she can't quit smoking. (We can and will promise to try to help, we just can't promise what we recommend will really work)   Pt does not seem motivated at this point to commit to quit.

## 2014-01-23 NOTE — Assessment & Plan Note (Signed)
Calcification strongly indicates this is likely benign but should be present then on remote cxrs  Will request  If possible he bring 2 y or greater comparison study back to [place] on return with pfts'

## 2014-01-23 NOTE — Assessment & Plan Note (Signed)
DDX of  difficult airways managment all start with A and  include Adherence, Ace Inhibitors, Acid Reflux, Active Sinus Disease, Alpha 1 Antitripsin deficiency, Anxiety masquerading as Airways dz,  ABPA,  allergy(esp in young), Aspiration (esp in elderly), Adverse effects of DPI,  Active smokers, plus two Bs  = Bronchiectasis and Beta blocker use..and one C= CHF  Adherence is always the initial "prime suspect" and is a multilayered concern that requires a "trust but verify" approach in every patient - starting with knowing how to use medications, especially inhalers, correctly, keeping up with refills and understanding the fundamental difference between maintenance and prns vs those medications only taken for a very short course and then stopped and not refilled.  - The proper method of use, as well as anticipated side effects, of a metered-dose inhaler are discussed and demonstrated to the patient. Improved effectiveness after extensive coaching during this visit to a level of approximately  75% so try symbicort 160 2bid to see if improves breathing and resolves saba dep  Active smoking obviously other great concern > see separate discussion

## 2014-02-26 ENCOUNTER — Telehealth: Payer: Self-pay | Admitting: Internal Medicine

## 2014-02-26 MED ORDER — BUDESONIDE-FORMOTEROL FUMARATE 160-4.5 MCG/ACT IN AERO: INHALATION_SPRAY | RESPIRATORY_TRACT | Status: DC

## 2014-02-26 NOTE — Telephone Encounter (Signed)
Spoke with and advised that we do not have record of speaking with him regarding Symbicort last week.  Rx sent to [facility] for 1 moth supply per pt and 3 mth supply sent to [facility] rx per Pt.

## 2014-03-06 ENCOUNTER — Ambulatory Visit: Payer: Medicare Other | Admitting: Internal Medicine

## 2014-03-13 ENCOUNTER — Ambulatory Visit: Payer: Medicare Other | Admitting: Internal Medicine

## 2014-04-11 ENCOUNTER — Encounter: Payer: Self-pay | Admitting: Internal Medicine

## 2014-04-11 ENCOUNTER — Ambulatory Visit (INDEPENDENT_AMBULATORY_CARE_PROVIDER_SITE_OTHER)
Admission: RE | Admit: 2014-04-11 | Discharge: 2014-04-11 | Disposition: A | Discharge status: Home / Self Care | Payer: Medicare Other | Source: Ambulatory Visit | Attending: Internal Medicine | Admitting: Internal Medicine

## 2014-04-11 ENCOUNTER — Encounter (INDEPENDENT_AMBULATORY_CARE_PROVIDER_SITE_OTHER): Payer: Self-pay

## 2014-04-11 ENCOUNTER — Ambulatory Visit (INDEPENDENT_AMBULATORY_CARE_PROVIDER_SITE_OTHER): Payer: Medicare Other | Admitting: Internal Medicine

## 2014-04-11 ENCOUNTER — Other Ambulatory Visit: Payer: Self-pay | Admitting: Internal Medicine

## 2014-04-11 VITALS — BP 106/64 | HR 103 | Temp 97.9°F

## 2014-04-11 DIAGNOSIS — J984 Other disorders of lung: Secondary | ICD-10-CM | POA: Insufficient documentation

## 2014-04-11 DIAGNOSIS — R911 Solitary pulmonary nodule: Secondary | ICD-10-CM

## 2014-04-11 DIAGNOSIS — F17200 Nicotine dependence, unspecified, uncomplicated: Secondary | ICD-10-CM

## 2014-04-11 DIAGNOSIS — J449 Chronic obstructive pulmonary disease, unspecified: Secondary | ICD-10-CM

## 2014-04-11 MED ORDER — AMOXICILLIN-POT CLAVULANATE 875-125 MG PO TABS
1.0000 | ORAL_TABLET | Freq: Two times a day (BID) | ORAL | Status: DC
Start: 2014-04-11 — End: 2014-05-08

## 2014-04-11 MED ORDER — AMOXICILLIN-POT CLAVULANATE 875-125 MG PO TABS: 1.0000 | ORAL_TABLET | Freq: Two times a day (BID) | ORAL | Status: DC

## 2014-04-11 MED ORDER — ACLIDINIUM BROMIDE 400 MCG/ACT IN AEPB: 1.0000 | INHALATION_SPRAY | Freq: Two times a day (BID) | RESPIRATORY_TRACT | Status: DC

## 2014-04-11 NOTE — Progress Notes (Signed)
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 [facility] rx with pred and avelox and finished it and feels like he's more nearly back to baseline = spiriva each am, but now feeling proair twice daily needed vs not at all needing saba before. Did have congested cough with thick yellow mucus, never bloody, but this has resolved. Baseline = doe with inclines and if he gets in a hurry but not if he takes his time on a flat surface. Rec Symbicort 160 Take 2 puffs first thing in am and then another 2 puffs about 12 hours later.  Spiriva each am only use your albuterol (proair) as a rescue medication   The key is to stop smoking completely before smoking completely stops you!     04/11/2014 f/u ov/Jorge Rosales re: GOLD III copd  Chief Complaint  Patient presents with  . Followup with PFT    Pt states that his breathing is "better, bout the same" and that his breathing is "fine'.  He states has "spells" of cough.  He states that he coughs more after he uses symbicort.    Can't lie down x 3-4 m due to sob, can't do more than slow adls or flat slow walking  Mucus occ light brown  No recent dental work  No obvious day to day or daytime variabilty or assoc  cp or chest tightness, subjective wheeze overt sinus or hb symptoms. No unusual exp hx or h/o childhood pna/ asthma or knowledge of premature birth.   Also denies any obvious  fluctuation of symptoms with weather or environmental changes or other aggravating or alleviating factors except as outlined above   Current Medications, Allergies, Complete Past Medical History, Past Surgical History, Family History, and Social History were reviewed in [employer] record.  ROS  The following are not active complaints unless bolded sore throat, dysphagia, dental problems, itching, sneezing,  nasal congestion or excess/ purulent secretions, ear ache,   fever, chills, sweats, unintended wt loss, pleuritic or exertional cp, hemoptysis,  orthopnea pnd or leg swelling, presyncope, palpitations, heartburn, abdominal pain, anorexia, nausea, vomiting, diarrhea  or change in bowel or urinary habits, change in stools or urine, dysuria,hematuria,  rash, arthralgias, visual complaints, headache, numbness weakness or ataxia or problems with walking or coordination,  change in mood/affect or memory.         Objective:   Physical Exam  amb wm   04/11/2014       129  Wt Readings from Last 3 Encounters:  01/22/14 154 lb 9.6 oz (70.126 kg)        HEENT mild turbinate edema.  Oropharynx no thrush or excess pnd or cobblestoning.  No JVD or cervical adenopathy. Mild accessory muscle hypertrophy. Trachea midline, nl thryroid. Chest was hyperinflated by percussion with diminished  breath sounds and moderate increased exp time without wheeze. Hoover sign positive at mid inspiration. Regular rate and rhythm without murmur gallop or rub or increase P2 or edema.  Abd: no hsm, nl excursion. Ext warm without cyanosis or clubbing.    cxr 01/10/14  1. Hyperinflated lungs suggest underlying small airways disease with query some minimal edema or bronchitis. 2. Likely calcified nodule in the left lower lobe or lingula. 3. Degenerative changes of the thoracic spine. 4. Pleural spaces are within normal limits.  CXR  04/11/2014 :  Cavitary lesion in left upper lobe measures 3.5 cm. Left  hilar  prominence suspicious for adenopathy. Further correlation with  enhanced CT scan of the chest is recommended. Nodule in left base  measures 1.2 cm.        Assessment & Plan:  .

## 2014-04-11 NOTE — Patient Instructions (Addendum)
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    Please schedule a follow up visit in 3 months but call sooner if needed  Late add: augmentin bid x 20 days then cxr

## 2014-04-11 NOTE — Progress Notes (Signed)
Quick Note:  ATC x 2 on both home and cell numbers- NA and mailbox was full  ATC daughter- LMTCB  I have already sent abx to pharm   ______

## 2014-04-11 NOTE — Progress Notes (Signed)
Quick Note:  Spoke with pt and notified of results per Dr. Melvyn Novas. Pt verbalized understanding and denied any questions. Ov scheduled for 05/05/14 ______

## 2014-04-11 NOTE — Assessment & Plan Note (Signed)

## 2014-04-11 NOTE — Progress Notes (Signed)
PFT done today. 

## 2014-04-11 NOTE — Assessment & Plan Note (Signed)
New cavitary lesion with prominent a/f level post segment LUL would be very fast for lung ca given no lesion there 3 months ago and pt reporting brown but not putrid sputum > rec 20 days augmentin then ov to regroup ? May need fob

## 2014-04-11 NOTE — Assessment & Plan Note (Addendum)
-   PFTs 04/11/2014  FEV1  1.04 (33%) ratio 40 and no better p  saba and dlco 39% ratio 59  - 04/11/2014  Walked RA  2 laps @ 185 ft each stopped due to  Legs gave out, no desats - 04/11/2014 p extensive coaching HFA effectiveness =    75% > change spiriva to  tudorza due to dry mouth  Active smoking > see sep a/p  The proper method of use, as well as anticipated side effects, of a metered-dose inhaler are discussed and demonstrated to the patient. Improved effectiveness after extensive coaching during this visit to a level of approximately  75% so try adding tudorza to symbicort

## 2014-04-13 LAB — PULMONARY FUNCTION TEST
DL/VA % PRED: 59 %
DL/VA: 2.66 ml/min/mmHg/L
DLCO unc % pred: 39 %
DLCO unc: 11.56 ml/min/mmHg
FEF 25-75 Post: 0.49 L/sec
FEF 25-75 Pre: 0.45 L/sec
FEF2575-%CHANGE-POST: 8 %
FEF2575-%Pred-Post: 20 %
FEF2575-%Pred-Pre: 19 %
FEV1-%CHANGE-POST: 8 %
FEV1-%PRED-POST: 36 %
FEV1-%Pred-Pre: 33 %
FEV1-PRE: 1.04 L
FEV1-Post: 1.12 L
FEV1FVC-%Change-Post: 3 %
FEV1FVC-%PRED-PRE: 54 %
FEV6-%Change-Post: 2 %
FEV6-%PRED-POST: 64 %
FEV6-%Pred-Pre: 62 %
FEV6-Post: 2.51 L
FEV6-Pre: 2.44 L
FEV6FVC-%Change-Post: -2 %
FEV6FVC-%PRED-PRE: 100 %
FEV6FVC-%Pred-Post: 98 %
FVC-%Change-Post: 5 %
FVC-%Pred-Post: 65 %
FVC-%Pred-Pre: 62 %
FVC-POST: 2.71 L
FVC-Pre: 2.58 L
PRE FEV1/FVC RATIO: 40 %
Post FEV1/FVC ratio: 41 %
Post FEV6/FVC ratio: 93 %
Pre FEV6/FVC Ratio: 95 %
RV % pred: 182 %
RV: 4.11 L
TLC % pred: 104 %
TLC: 6.81 L

## 2014-04-15 ENCOUNTER — Telehealth: Payer: Self-pay | Admitting: Internal Medicine

## 2014-04-15 NOTE — Telephone Encounter (Signed)
All I have is a cxr report from his previous outside w/u - if there is a CT scan that's been done previously in March 2015  need to find out where and have the report  faxed to me  But the plan in the meantime would be the same even if it did appear on a previous CT (it would be nice to have the actual scan in hand if it will run on our equipment at f/u but I would like the report in the meantime by fax if possible)

## 2014-04-15 NOTE — Telephone Encounter (Signed)
Spoke with the pt's daughter Ria Comment  She is asking about lung abscess- wants to know if this was there when pt had ct chest in March 2015  She also wants to know how pt developed this  Please advise, thanks!

## 2014-04-15 NOTE — Telephone Encounter (Signed)
I spoke with the pt's daughter and notified of recs per MW She verbalized understanding  Will try and obtain the ct  It was done somewhere in [place]- not sure where

## 2014-05-05 ENCOUNTER — Ambulatory Visit: Payer: Medicare Other | Admitting: Internal Medicine

## 2014-05-08 ENCOUNTER — Ambulatory Visit (INDEPENDENT_AMBULATORY_CARE_PROVIDER_SITE_OTHER)
Admission: RE | Admit: 2014-05-08 | Discharge: 2014-05-08 | Disposition: A | Discharge status: Home / Self Care | Payer: Medicare Other | Source: Ambulatory Visit | Attending: Internal Medicine | Admitting: Internal Medicine

## 2014-05-08 ENCOUNTER — Encounter: Payer: Self-pay | Admitting: Internal Medicine

## 2014-05-08 ENCOUNTER — Ambulatory Visit (INDEPENDENT_AMBULATORY_CARE_PROVIDER_SITE_OTHER): Payer: Medicare Other | Admitting: Internal Medicine

## 2014-05-08 VITALS — BP 120/80 | HR 90 | Temp 98.0°F | Ht 70.0 in | Wt 137.0 lb

## 2014-05-08 DIAGNOSIS — J449 Chronic obstructive pulmonary disease, unspecified: Secondary | ICD-10-CM

## 2014-05-08 DIAGNOSIS — J984 Other disorders of lung: Secondary | ICD-10-CM

## 2014-05-08 DIAGNOSIS — F17200 Nicotine dependence, unspecified, uncomplicated: Secondary | ICD-10-CM

## 2014-05-08 NOTE — Progress Notes (Addendum)
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 [facility] rx with pred and avelox and finished it and feels like he's more nearly back to baseline = spiriva each am, but now feeling proair twice daily needed vs not at all needing saba before. Did have congested cough with thick yellow mucus, never bloody, but this has resolved. Baseline = doe with inclines and if he gets in a hurry but not if he takes his time on a flat surface. Rec Symbicort 160 Take 2 puffs first thing in am and then another 2 puffs about 12 hours later.  Spiriva each am only use your albuterol (proair) as a rescue medication   The key is to stop smoking completely before smoking completely stops you!     04/11/2014 f/u ov/Jorge Rosales re: GOLD III copd  Chief Complaint  Patient presents with  . Followup with PFT    Pt states that his breathing is "better, bout the same" and that his breathing is "fine'.  He states has "spells" of cough.  He states that he coughs more after he uses symbicort.   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 Please schedule a follow up visit in 3 months but call sooner if needed  Late add: augmentin bid x 20 days then cxr     05/08/2014 f/u ov/Jorge Rosales re: ? Lung abscess in pt with GOLD III copd on maint rx with symbicort and spiriva Chief Complaint  Patient presents with  . Follow-up    Pt states "I feel as good as I have felt in a lonmg time". He denies any SOB or cough.     Did not take augmentin correctly  Not limited by breathing from desired activities though not very active  No purulent sputum  Not needing cough suppression or prn saba   No obvious day to day or daytime variabilty or assoc  cp or chest tightness, subjective wheeze overt sinus or hb symptoms. No unusual exp hx or h/o childhood pna/ asthma or knowledge of premature birth.   Also denies any obvious fluctuation of symptoms with weather or environmental changes or other aggravating or alleviating factors except as outlined above   Current Medications, Allergies, Complete Past Medical History, Past Surgical History, Family History, and Social History  were reviewed in [facility] record.  ROS  The following are not active complaints unless bolded sore throat, dysphagia, dental problems, itching, sneezing,  nasal congestion or excess/ purulent secretions, ear ache,   fever, chills, sweats, unintended wt loss, pleuritic or exertional cp, hemoptysis,  orthopnea pnd or leg swelling, presyncope, palpitations, heartburn, abdominal pain, anorexia, nausea, vomiting, diarrhea  or change in bowel or urinary habits, change in stools or urine, dysuria,hematuria,  rash, arthralgias, visual complaints, headache, numbness weakness or ataxia or problems with walking or coordination,  change in mood/affect or memory.         Objective:   Physical Exam  amb wm   04/11/2014       129 >   05/08/2014  137  Wt Readings from Last 3 Encounters:  01/22/14 154 lb 9.6 oz (70.126 kg)        HEENT mild turbinate edema.  Oropharynx no thrush or excess pnd or cobblestoning.  No JVD or cervical adenopathy. Mild accessory muscle hypertrophy. Trachea midline, nl thryroid. Chest was hyperinflated by percussion with diminished breath sounds and moderate increased exp time without wheeze. Hoover sign positive at mid inspiration. Regular rate and rhythm without murmur gallop or rub or increase P2 or edema.  Abd: no hsm, nl excursion. Ext warm without cyanosis or clubbing.    cxr 01/10/14  1. Hyperinflated lungs suggest underlying small airways disease with query some minimal edema or bronchitis. 2. Likely calcified nodule in the left lower lobe or lingula. 3. Degenerative changes of the thoracic spine. 4. Pleural spaces are within normal limits.   CXR  04/11/2014 : Cavitary lesion in left upper lobe measures 3.5 cm. Left hilar  prominence suspicious for adenopathy. Further correlation with  enhanced CT scan of the chest is recommended. Nodule in left base  measures 1.2 cm.  CXR  05/08/2014 : There has been slight interval increase in the size of the left upper lobe air-fluid level but the measurements are approximate. Surrounding interstitial density in the left perihilar region is more conspicuous however. Chest CT scanning would allow all more precise characterization of the pulmonary parenchymal lesions.           Assessment & Plan:  .

## 2014-05-08 NOTE — Patient Instructions (Signed)
You will likely need bronchoscopy and the best approach would be to have this taken care of by your doctors there or bring all your scans here for review.   In the meantime I recommend Augmentin 875 mg take one pill twice daily  X 20 days - take at breakfast and supper with large glass of water.  It would help reduce the usual side effects (diarrhea and yeast infections) if you ate cultured yogurt at lunch.   Call me with fax number of your doctor and I will send him my notes

## 2014-05-11 NOTE — Assessment & Plan Note (Signed)
-   PFTs 04/11/2014  FEV1  1.04 (33%) ratio 40 and no better p  saba and dlco 39% ratio 59  - 04/11/2014 p extensive coaching HFA effectiveness =    75% > change spiriva to  tudorza due to dry mouth - 04/11/2014  Walked RA  2 laps @ 185 ft each stopped due to  Legs gave out, no desats  Despite smoking > Adequate control on present rx, reviewed > no change in rx needed , clearly not a candidate for lobectomy with such limited reserve

## 2014-05-11 NOTE — Assessment & Plan Note (Signed)

## 2014-05-11 NOTE — Assessment & Plan Note (Signed)
-   baseline cxr at [facility] 01/10/14 no abnormality in LUL - see cxr 04/11/2014 > slt worse 05/08/14 but did not take augmentin correctly   I had an extended discussion with the patient today lasting 15 to 20 minutes of a 25 minute visit on the following issues: It's inconceivable to me that he has a giant cavitary lung mass in an area that 2 months ago was read as nl at [facility] so this must be an abscess though I suppose a proximal squam could do the same thing with post obst pna/infection added to the original problem  In any case, needs f/u in one medical center with apples to apples comparison to previous studies including CT done in [facility] and in meantime rec he take the augmentin as rec x 20 more days

## 2014-05-12 ENCOUNTER — Telehealth: Payer: Self-pay | Admitting: Internal Medicine

## 2014-05-12 NOTE — Telephone Encounter (Signed)
Left message to call back during office hours 6/16

## 2014-05-12 NOTE — Telephone Encounter (Signed)
Spoke with Ria Comment, the pt's daughter  She is asking for details of the pt's visit  I advised will send msg to MW to have him address  Please advise, thanks!

## 2014-05-13 NOTE — Telephone Encounter (Signed)
Pt returned Dr Gustavus Bryant call.

## 2014-05-13 NOTE — Telephone Encounter (Signed)
Discussed with daughter, will need to consolidate all his care in [place] at [facility] or here with scans in hand

## 2014-05-14 ENCOUNTER — Telehealth: Payer: Self-pay | Admitting: Internal Medicine

## 2014-05-14 MED ORDER — AMOXICILLIN-POT CLAVULANATE 875-125 MG PO TABS: 1.0000 | ORAL_TABLET | Freq: Two times a day (BID) | ORAL | Status: DC

## 2014-05-14 NOTE — Telephone Encounter (Signed)
I called pt. Aware this has been faxed over to PCP. Nothing further needed

## 2014-05-14 NOTE — Telephone Encounter (Signed)
Called [facility] in [place]. They never received RX for augmentin.  I called pt to get correct [facility] in [place]. RX sent. Nothing further needed

## 2014-05-15 ENCOUNTER — Telehealth: Payer: Self-pay | Admitting: Internal Medicine

## 2014-05-15 NOTE — Telephone Encounter (Signed)
Received 19 pages from [facility], sent to Dr. Melvyn Novas. 05/15/14/ss

## 2014-06-06 ENCOUNTER — Ambulatory Visit (INDEPENDENT_AMBULATORY_CARE_PROVIDER_SITE_OTHER): Payer: Medicare Other | Admitting: Internal Medicine

## 2014-06-06 ENCOUNTER — Ambulatory Visit (INDEPENDENT_AMBULATORY_CARE_PROVIDER_SITE_OTHER)
Admission: RE | Admit: 2014-06-06 | Discharge: 2014-06-06 | Disposition: A | Discharge status: Home / Self Care | Payer: Medicare Other | Source: Ambulatory Visit | Attending: Internal Medicine | Admitting: Internal Medicine

## 2014-06-06 ENCOUNTER — Encounter: Payer: Self-pay | Admitting: Internal Medicine

## 2014-06-06 VITALS — BP 112/60 | HR 95 | Temp 97.9°F | Ht 70.0 in | Wt 144.0 lb

## 2014-06-06 DIAGNOSIS — J449 Chronic obstructive pulmonary disease, unspecified: Secondary | ICD-10-CM

## 2014-06-06 DIAGNOSIS — F17200 Nicotine dependence, unspecified, uncomplicated: Secondary | ICD-10-CM

## 2014-06-06 DIAGNOSIS — J984 Other disorders of lung: Secondary | ICD-10-CM

## 2014-06-06 NOTE — Progress Notes (Signed)
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 [facility] rx with pred and avelox and finished it and feels like he's more nearly back to baseline = spiriva each am, but now feeling proair twice daily needed vs not at all needing saba before. Did have congested cough with thick yellow mucus, never bloody, but this has resolved. Baseline = doe with inclines and if he gets in a hurry but not if he takes his time on a flat surface. Rec Symbicort 160 Take 2 puffs first thing in am and then another 2 puffs about 12 hours later.  Spiriva each am only use your albuterol (proair) as a rescue medication   The key is to stop smoking completely before smoking completely stops you!     04/11/2014 f/u ov/Aiken Withem re: GOLD III copd  Chief Complaint  Patient presents with  . Followup with PFT    Pt states that his breathing is "better, bout the same" and that his breathing is "fine'.  He states has "spells" of cough.  He states that he coughs more after he uses symbicort.   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 Please schedule a follow up visit in 3 months but call sooner if needed  Late add: augmentin bid x 20 days then cxr     05/08/2014 f/u ov/Analyse Angst re: ? Lung abscess in pt with GOLD III copd on maint rx with symbicort and spiriva Chief Complaint  Patient presents with  . Follow-up    Pt states "I feel as good as I have felt in a lonmg time". He denies any SOB or cough.     Did not take augmentin correctly  Not limited by breathing from desired activities though not very active  No purulent sputum  Not needing cough suppression or prn saba rec You will likely need bronchoscopy and the best approach would be to have this taken care of by your doctors there or bring all your scans here for review > [facility] In the meantime I recommend Augmentin 875 mg take one pill twice daily  X 20 days -    06/06/2014 f/u ov/Yee Gangi re: copd/ ? Lung abscess/ on symb 160/spiriva maint  Chief Complaint  Patient presents with  . Follow-up  Pt doing well and denies any co's today.     No more cough/ no need for saba Not limited by breathing from desired activities     No obvious day to day or daytime variabilty or assoc  cp or chest tightness, subjective wheeze overt sinus or hb symptoms. No unusual exp hx or h/o childhood pna/ asthma or knowledge of premature birth.   Also denies any obvious fluctuation of symptoms with weather or environmental changes or other aggravating or alleviating factors except as outlined above   Current Medications, Allergies, Complete Past Medical History, Past Surgical History, Family History, and Social History were reviewed in [employer] record.  ROS  The following are not active complaints unless bolded sore throat, dysphagia, dental problems, itching, sneezing,  nasal congestion or excess/ purulent secretions, ear  ache,   fever, chills, sweats, unintended wt loss, pleuritic or exertional cp, hemoptysis,  orthopnea pnd or leg swelling, presyncope, palpitations, heartburn, abdominal pain, anorexia, nausea, vomiting, diarrhea  or change in bowel or urinary habits, change in stools or urine, dysuria,hematuria,  rash, arthralgias, visual complaints, headache, numbness weakness or ataxia or problems with walking or coordination,  change in mood/affect or memory.         Objective:   Physical Exam  amb wm   04/11/2014       129 >  05/08/2014  137 > 06/06/2014 144  Wt Readings from Last 3 Encounters:  01/22/14 154 lb 9.6 oz (70.126 kg)        HEENT mild turbinate edema.  Oropharynx no thrush or excess pnd or cobblestoning.  No JVD or cervical adenopathy. Mild accessory muscle hypertrophy. Trachea midline, nl thryroid. Chest was hyperinflated by percussion with diminished breath sounds and moderate increased exp time without wheeze. Hoover sign positive at mid inspiration. Regular rate and rhythm without murmur gallop or rub or increase P2 or edema.  Abd: no hsm, nl excursion. Ext warm without cyanosis or clubbing.    cxr 01/10/14  1. Hyperinflated lungs suggest underlying small airways disease with query some minimal edema or bronchitis. 2. Likely calcified nodule in the left lower lobe or lingula. 3. Degenerative changes of the thoracic spine. 4. Pleural spaces are within normal limits.   CXR  04/11/2014 : Cavitary lesion in left upper lobe measures 3.5 cm. Left hilar  prominence suspicious for adenopathy. Further correlation with  enhanced CT scan of the chest is recommended. Nodule in left base  measures 1.2 cm.  CXR  05/08/2014 : There has been slight interval increase in the size of the left upper lobe air-fluid level but the measurements are approximate. Surrounding interstitial density in the left perihilar region is more conspicuous however. Chest CT scanning would allow all more precise  characterization of the pulmonary parenchymal lesions.  CXR 06/06/14 Persisting cavitary lesion although the majority of the lesion  represents air at the current time. CT scanning may be helpful for  further evaluation.             Assessment & Plan:  .

## 2014-06-06 NOTE — Patient Instructions (Addendum)
Please remember to go to the  x-ray department downstairs for your tests - we will call you with the results when they are available and I will call your daughter with the report also

## 2014-06-06 NOTE — Progress Notes (Signed)
Quick Note:  Spoke with pt and notified of results per Dr. Wert. Pt verbalized understanding and denied any questions.  ______ 

## 2014-06-07 NOTE — Assessment & Plan Note (Signed)
-   PFTs 04/11/2014  FEV1  1.04 (33%) ratio 40 and no better p  saba and dlco 39% ratio 59  - 04/11/2014 p extensive coaching HFA effectiveness =    75% > change spiriva to  tudorza due to dry mouth> preferred spiriva - 04/11/2014  Walked RA  2 laps @ 185 ft each stopped due to  Legs gave out, no desats  Doing much better but still smoking> discussed separately  Adequate control on present rx, reviewed > no change in rx needed

## 2014-06-07 NOTE — Assessment & Plan Note (Signed)
-   baseline cxr at [facility] 01/10/14 no abnormality in LUL - see cxr 04/11/2014 > slt worse 05/08/14 but did not take augmentin correctly > rechallenged with adequate course augmentin > improved 06/06/14 > rec f/u one m  Still has not provided the CT requested from [facility] > strongly rec he bring it with him at next ov rather than rely on practice to practice transfer

## 2014-06-07 NOTE — Assessment & Plan Note (Signed)
>   3 m  I took an extended  opportunity with this patient to outline the consequences of continued cigarette use  in airway disorders based on all the data we have from the multiple national lung health studies (perfomed over decades at millions of dollars in cost)  indicating that smoking cessation, not choice of inhalers or physicians, is the most important aspect of care.   

## 2014-07-14 ENCOUNTER — Encounter: Payer: Self-pay | Admitting: Internal Medicine

## 2014-07-14 ENCOUNTER — Ambulatory Visit (INDEPENDENT_AMBULATORY_CARE_PROVIDER_SITE_OTHER): Payer: Medicare Other | Admitting: Internal Medicine

## 2014-07-14 ENCOUNTER — Ambulatory Visit (INDEPENDENT_AMBULATORY_CARE_PROVIDER_SITE_OTHER)
Admission: RE | Admit: 2014-07-14 | Discharge: 2014-07-14 | Disposition: A | Discharge status: Home / Self Care | Payer: Medicare Other | Source: Ambulatory Visit | Attending: Internal Medicine | Admitting: Internal Medicine

## 2014-07-14 VITALS — BP 140/78 | HR 81 | Temp 97.8°F | Ht 70.0 in | Wt 145.2 lb

## 2014-07-14 DIAGNOSIS — F17200 Nicotine dependence, unspecified, uncomplicated: Secondary | ICD-10-CM

## 2014-07-14 DIAGNOSIS — J984 Other disorders of lung: Secondary | ICD-10-CM

## 2014-07-14 DIAGNOSIS — J449 Chronic obstructive pulmonary disease, unspecified: Secondary | ICD-10-CM

## 2014-07-14 NOTE — Assessment & Plan Note (Signed)
-   PFTs 04/11/2014  FEV1  1.04 (33%) ratio 40 and no better p  saba and dlco 39% ratio 59  - 04/11/2014 p extensive coaching HFA effectiveness =    75% > change spiriva to  tudorza due to dry mouth> preferred spiriva - 04/11/2014  Walked RA  2 laps @ 185 ft each stopped due to  Legs gave out, no desats  Adequate control on present rx, reviewed > no change in rx needed

## 2014-07-14 NOTE — Patient Instructions (Addendum)
Will need actual copies of actual cxr's and CT scans when you return from [place]  Please remember to go to the x-ray department downstairs for your tests - we will call you with the results when they are available.  Call if you would like [place] to refer you to pulmonary rehab at [place] is to stop smoking completely before smoking completely stops you!   Please schedule a follow up office visit in 6 weeks, call sooner if needed

## 2014-07-14 NOTE — Assessment & Plan Note (Signed)
-   baseline cxr at [facility] 01/10/14 no abnormality in LUL - see cxr 04/11/2014 > slt worse 05/08/14 but did not take augmentin correctly > rechallenged with adequate course augmentin > improved 06/06/14 > no change 07/14/2014 > rec repeat 6 weeks [facility]   It is possible that he has a proximal LUL endobronchial process explaining the distal changes that have failed to resolve but even if so is not a candidate for Lobectomy based on his pfts and clinically he's doing quite well and planning to move to [place] in the coming weeks so suggested he stop by the [place] location of his CT and bring this with him on f/u in 6 weeks

## 2014-07-14 NOTE — Assessment & Plan Note (Signed)
>   3 min discussion  Says he's just about ready to quit, emphasized it's the most impt aspect of his care.

## 2014-07-14 NOTE — Progress Notes (Signed)
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 [facility] rx with pred and avelox and finished it and feels like he's more nearly back to baseline = spiriva each am, but now feeling proair twice daily needed vs not at all needing saba before. Did have congested cough with thick yellow mucus, never bloody, but this has resolved. Baseline = doe with inclines and if he gets in a hurry but not if he takes his time on a flat surface. Rec Symbicort 160 Take 2 puffs first thing in am and then another 2 puffs about 12 hours later.  Spiriva each am only use your albuterol (proair) as a rescue medication   The key is to stop smoking completely before smoking completely stops you!     04/11/2014 f/u ov/[name] re: GOLD III copd  Chief Complaint  Patient presents with  . Followup with PFT    Pt states that his breathing is "better, bout the same" and that his breathing is "fine'.  He states has "spells" of cough.  He states that he coughs more after he uses symbicort.   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 Please schedule a follow up visit in 3 months but call sooner if needed  Late add: augmentin bid x 20 days then cxr     05/08/2014 f/u ov/[name] re: ? Lung abscess in pt with GOLD III copd on maint rx with symbicort and spiriva Chief Complaint  Patient presents with  . Follow-up    Pt states "I feel as good as I have felt in a lonmg time". He denies any SOB or cough.     Did not take augmentin correctly  Not limited by breathing from desired activities though not very active  No purulent sputum  Not needing cough suppression or prn saba rec You will likely need bronchoscopy and the best approach would be to have this taken care of by your doctors there or bring all your scans here for review > [facility] In the meantime I recommend Augmentin 875 mg take one pill twice daily  X 20 days -    06/06/2014 f/u ov/[name] re: copd/ ? Lung abscess/ on symb 160/spiriva maint  Chief Complaint  Patient presents with  . Follow-up  Pt doing well and denies any co's today.   No more cough/ no need for saba Not limited by breathing from desired activities but very sedentary rec No change rx.   07/14/2014 f/u ov/[name] re: LUL cavity/ copd GOLD III/ still smoking / on symbicort/spiriva maint rx  Chief Complaint  Patient presents with  . Follow-up    Pt states he has occasional dry cough, not any worse.    Not limited by breathing from desired activities  - overall feeling much better, no need for saba  No obvious day to day or daytime variabilty or assoc  cp or chest tightness, subjective wheeze overt sinus or hb symptoms. No unusual exp hx or h/o childhood pna/ asthma or knowledge of premature birth.   Also denies any obvious fluctuation of symptoms with weather or environmental changes or other aggravating or alleviating factors except as outlined above   Current  Medications, Allergies, Complete Past Medical History, Past Surgical History, Family History, and Social History were reviewed in [employer] record.  ROS  The following are not active complaints unless bolded sore throat, dysphagia, dental problems, itching, sneezing,  nasal congestion or excess/ purulent secretions, ear ache,   fever, chills, sweats, unintended wt loss, pleuritic or exertional cp, hemoptysis,  orthopnea pnd or leg swelling, presyncope, palpitations, heartburn, abdominal pain, anorexia, nausea, vomiting, diarrhea  or change in bowel or urinary habits, change in stools or urine, dysuria,hematuria,  rash, arthralgias, visual complaints, headache, numbness weakness or ataxia or problems with walking or coordination,  change in mood/affect or memory.         Objective:   Physical Exam  amb wm   04/11/2014       129 >  05/08/2014  137 > 06/06/2014 144  > 07/14/2014 145  Wt Readings from Last 3 Encounters:  01/22/14 154 lb 9.6 oz (70.126 kg)        HEENT mild turbinate edema.  Oropharynx no thrush or excess pnd or cobblestoning.  No JVD or cervical adenopathy. Mild accessory muscle hypertrophy. Trachea midline, nl thryroid. Chest was hyperinflated by percussion with diminished breath sounds and moderate increased exp time without wheeze. Hoover sign positive at mid inspiration. Regular rate and rhythm without murmur gallop or rub or increase P2 or edema.  Abd: no hsm, nl excursion. Ext warm without cyanosis or clubbing.    cxr 01/10/14 from [facility] 1. Hyperinflated lungs suggest underlying small airways disease with query some minimal edema or bronchitis. 2. Likely calcified nodule in the left lower lobe or lingula. 3. Degenerative changes of the thoracic spine. 4. Pleural spaces are within normal limits.     CXR  07/14/2014 : 1. COPD.  2. Stable left upper lobe cavitary lesion.  3. Stable appearance of left lung nodule, possibly a calcified   granuloma.  Assessment & Plan:  .

## 2014-07-15 ENCOUNTER — Telehealth: Payer: Self-pay | Admitting: Internal Medicine

## 2014-07-15 NOTE — Telephone Encounter (Signed)
Notes Recorded by Tanda Rockers, MD on 07/14/2014 at 4:54 PM Call pt: Reviewed cxr and no acute change so no change in recommendations made at [facility] and spoke with pt and he is aware of results per MW>  Nothing further is needed.

## 2014-08-26 ENCOUNTER — Ambulatory Visit: Payer: Medicare Other | Admitting: Internal Medicine

## 2014-11-03 ENCOUNTER — Ambulatory Visit (INDEPENDENT_AMBULATORY_CARE_PROVIDER_SITE_OTHER): Payer: Medicare Other | Admitting: Internal Medicine

## 2014-11-03 ENCOUNTER — Encounter: Payer: Self-pay | Admitting: Internal Medicine

## 2014-11-03 ENCOUNTER — Ambulatory Visit (INDEPENDENT_AMBULATORY_CARE_PROVIDER_SITE_OTHER)
Admission: RE | Admit: 2014-11-03 | Discharge: 2014-11-03 | Disposition: A | Discharge status: Home / Self Care | Payer: Medicare Other | Source: Ambulatory Visit | Attending: Internal Medicine | Admitting: Internal Medicine

## 2014-11-03 VITALS — BP 134/80 | HR 82 | Ht 70.0 in | Wt 137.2 lb

## 2014-11-03 DIAGNOSIS — J984 Other disorders of lung: Secondary | ICD-10-CM

## 2014-11-03 DIAGNOSIS — F17200 Nicotine dependence, unspecified, uncomplicated: Secondary | ICD-10-CM

## 2014-11-03 DIAGNOSIS — Z720 Tobacco use: Secondary | ICD-10-CM

## 2014-11-03 DIAGNOSIS — J449 Chronic obstructive pulmonary disease, unspecified: Secondary | ICD-10-CM

## 2014-11-03 MED ORDER — ALBUTEROL SULFATE HFA 108 (90 BASE) MCG/ACT IN AERS: INHALATION_SPRAY | RESPIRATORY_TRACT | Status: DC

## 2014-11-03 NOTE — Patient Instructions (Addendum)
Please remember to go to the  x-ray department downstairs for your tests - we will call you with the results when they are available.  Please schedule a follow up visit in 3 months but call sooner if needed    Will need actual copies of actual cxr's and CT scans when you return from [place]  Please remember to go to the x-ray department downstairs for your tests - we will call you with the results when they are available.  Call if you would like [place] to refer you to pulmonary rehab at [place] is to stop smoking completely before smoking completely stops you!   Please schedule a follow up office visit in 6 weeks, call sooner if needed

## 2014-11-03 NOTE — Progress Notes (Signed)
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 rx with pred and avelox and finished it and feels like he's more nearly back to baseline = spiriva each am, but now feeling proair twice daily needed vs not at all needing saba before. Did have congested cough with thick yellow mucus, never bloody, but this has resolved. Baseline = doe with inclines and if he gets in a hurry but not if he takes his time on a flat surface. Rec Symbicort 160 Take 2 puffs first thing in am and then another 2 puffs about 12 hours later.  Spiriva each am only use your albuterol (proair) as a rescue medication   The key is to stop smoking completely before smoking completely stops you!     04/11/2014 f/u ov/Jorge Rosales re: GOLD III copd  Chief Complaint  Patient presents with  . Followup with PFT    Pt states that his breathing is "better, bout the same" and that his breathing is "fine'.  He states has "spells" of cough.  He states that he coughs more after he uses symbicort.   Can't lie down x 3-4 m due to sob, can't do more than slow adls or flat slow walking  Mucus occ light brown  No recent dental work rec You have a GOLD III copd with significant emphysema that will only stabilize if you stop smoking Try off spiriva and on tudorza one twice daily immediately after  symbicort and if you like it fill the rx, if not resume spiriva  Please remember to go to the  x-ray department downstairs for your tests - we will call you with the results when they are available. Please schedule a follow up visit in 3 months but call sooner if needed  Late add: augmentin bid x 20 days then cxr     05/08/2014 f/u ov/Jorge Rosales re: ? Lung abscess in pt with GOLD III copd on maint rx with symbicort and spiriva Chief Complaint  Patient presents with  . Follow-up    Pt states "I feel as good as I have felt in a lonmg time". He denies any SOB or cough.     Did not take augmentin correctly  Not limited by breathing from desired activities though not very active  No purulent sputum  Not needing cough suppression or prn saba rec You will likely need bronchoscopy and the best approach would be to have this taken care of by your doctors there or bring all your scans here for review > [facility] In the meantime I recommend Augmentin 875 mg take one pill twice daily  X 20 days -       07/14/2014 f/u ov/Jorge Rosales re: LUL cavity/ copd GOLD  III/ still smoking / on symbicort/spiriva maint rx  Chief Complaint  Patient presents with  . Follow-up    Pt states he has occasional dry cough, not any worse.    Not limited by breathing from desired activities  - overall feeling much better, no need for saba rec Will need actual copies of actual cxr's and CT scans when you return from [place]  Please remember to go to the x-ray department downstairs for your tests - we will call you with the results when they are available.  Call if you would like [place] to refer you to pulmonary rehab at [facility]   11/03/2014 f/u ov/Jorge Rosales re: GOLD III copd/ s/p lung abscess Chief Complaint  Patient presents with  . Follow-up    Pt states doing well and denies any new co's today.    Not limited by breathing from desired activities but very sedentary   / no need for saba on spiriva and symbicort maint rx   No obvious  day to day or daytime variabilty or assoc chronic cough or cp or chest tightness, subjective wheeze overt sinus or hb symptoms. No unusual exp hx or h/o childhood pna/ asthma or knowledge of premature birth.  Sleeping ok without nocturnal  or early am exacerbation  of respiratory  c/o's or need for noct saba. Also denies any obvious fluctuation of symptoms with weather or environmental changes or other aggravating or alleviating factors except as outlined above   Current Medications, Allergies, Complete Past Medical History, Past Surgical History, Family History, and Social History were reviewed in [employer] record.  ROS  The following are not active complaints unless bolded sore throat, dysphagia, dental problems, itching, sneezing,  nasal congestion or excess/ purulent secretions, ear ache,   fever, chills, sweats, unintended wt loss, pleuritic or exertional cp, hemoptysis,  orthopnea pnd or leg swelling, presyncope, palpitations, heartburn, abdominal pain, anorexia, nausea, vomiting, diarrhea  or change in bowel or urinary habits, change in stools or urine, dysuria,hematuria,  rash, arthralgias, visual complaints, headache, numbness weakness or ataxia or problems with walking or coordination,  change in mood/affect or memory.              Objective:   Physical Exam  amb wm nad   04/11/2014       129 >  05/08/2014  137 > 06/06/2014 144  > 07/14/2014 145 > 11/03/14 137  Wt Readings from Last 3 Encounters:  01/22/14 154 lb 9.6 oz (70.126 kg)        HEENT mild turbinate edema.  Oropharynx no thrush or excess pnd or cobblestoning.  No JVD or cervical adenopathy. Mild accessory muscle hypertrophy. Trachea midline, nl thryroid. Chest was hyperinflated by percussion with diminished breath sounds and moderate increased exp time without wheeze. Hoover sign positive at mid inspiration. Regular rate and rhythm without murmur gallop or rub or increase P2 or edema.  Abd: no hsm, nl  excursion. Ext warm without cyanosis or clubbing.      cxr 01/10/14 from [facility] 1. Hyperinflated lungs suggest underlying small airways disease with query some minimal edema or bronchitis. 2. Likely calcified nodule in the left lower lobe or lingula. 3. Degenerative changes of the thoracic spine. 4. Pleural spaces are within normal limits.     CXR PA and Lateral:   11/03/2014 : Stable COPD pattern, left upper lobe cavitary lesion, and granulomatous disease.    Assessment & Plan:  .

## 2014-11-04 ENCOUNTER — Telehealth: Payer: Self-pay | Admitting: Internal Medicine

## 2014-11-04 MED ORDER — ALBUTEROL SULFATE HFA 108 (90 BASE) MCG/ACT IN AERS
INHALATION_SPRAY | RESPIRATORY_TRACT | Status: DC
Start: 2014-11-04 — End: 2021-05-19

## 2014-11-04 NOTE — Telephone Encounter (Signed)
Called home # and daughter advised me to call pt on cell #. Called spoke with pt on mobile. [facility] I have done so. Nothing further needed

## 2014-11-05 ENCOUNTER — Telehealth: Payer: Self-pay | Admitting: Internal Medicine

## 2014-11-05 NOTE — Telephone Encounter (Signed)
Called and spoke with pt and he stated that he did get the rx from the pharmacy.  Nothing further is needed.

## 2014-11-05 NOTE — Progress Notes (Signed)
Quick Note:  Spoke with pt and notified of results per Dr. Wert. Pt verbalized understanding and denied any questions.  ______ 

## 2014-11-06 NOTE — Assessment & Plan Note (Signed)
>   3 min  Again, I took an extended  opportunity with this patient to outline the consequences of continued cigarette use  in airway disorders based on all the data we have from the multiple national lung health studies (perfomed over decades at millions of dollars in cost)  indicating that smoking cessation, not choice of inhalers or physicians, is the most important aspect of care.

## 2014-11-06 NOTE — Assessment & Plan Note (Signed)
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  Discussed in detail all the  indications, usual  risks and alternatives  relative to the benefits with patient who agrees to proceed with conservative f/u

## 2014-11-06 NOTE — Assessment & Plan Note (Signed)
-   PFTs 04/11/2014  FEV1  1.04 (33%) ratio 40 and no better p  saba and dlco 39% ratio 59  - 04/11/2014 p extensive coaching HFA effectiveness =    75% > change spiriva to  tudorza due to dry mouth> preferred spiriva - 04/11/2014  Walked RA  2 laps @ 185 ft each stopped due to  Legs gave out, no desats - rec [facility] rehab 07/14/14   Unfortunately he is still smoking and likely to progress soon to a GOLD IV and the only was to stop progression is to stop smoking (see sep a/p) From a medicine perspective though Adequate control on present rx, reviewed > no change in rx needed

## 2014-11-22 ENCOUNTER — Telehealth: Payer: Self-pay | Admitting: Internal Medicine

## 2014-11-22 MED ORDER — PREDNISONE 10 MG PO TABS: ORAL_TABLET | ORAL | Status: DC

## 2014-11-22 MED ORDER — AMOXICILLIN-POT CLAVULANATE 875-125 MG PO TABS: 1.0000 | ORAL_TABLET | Freq: Two times a day (BID) | ORAL | Status: DC

## 2014-11-22 NOTE — Telephone Encounter (Signed)
inceasing cough/ congestion and mucus turning thicker "just like before" - in [place]  rec Augmentin 875 mg take one pill twice daily  X 10 days - take at breakfast and supper with large glass of water.  It would help reduce the usual side effects (diarrhea and yeast infections) if you ate cultured yogurt at lunch.  Prednisone 10 mg take  4 each am x 2 days,   2 each am x 2 days,  1 each am x 2 days and stop  Call in not improving - to er /uc if worse

## 2015-01-17 ENCOUNTER — Telehealth: Payer: Self-pay | Admitting: Internal Medicine

## 2015-01-17 MED ORDER — AZITHROMYCIN 250 MG PO TABS: ORAL_TABLET | ORAL | Status: DC

## 2015-01-17 MED ORDER — PREDNISONE 10 MG PO TABS: ORAL_TABLET | ORAL | Status: DC

## 2015-01-17 NOTE — Telephone Encounter (Signed)
3-4 days increasing cough/ congestion and sob while in [place].  rec  Zpak/ Prednisone 10 mg take  4 each am x 2 days,   2 each am x 2 days,  1 each am x 2 days and stop  to uc or ER there if worse

## 2015-01-18 ENCOUNTER — Telehealth: Payer: Self-pay | Admitting: Internal Medicine

## 2015-01-18 NOTE — Telephone Encounter (Signed)
meds did not transfer electronically [facility] [place] area

## 2015-01-27 ENCOUNTER — Telehealth: Payer: Self-pay | Admitting: Internal Medicine

## 2015-01-27 ENCOUNTER — Ambulatory Visit (INDEPENDENT_AMBULATORY_CARE_PROVIDER_SITE_OTHER): Payer: Medicare Other | Admitting: Internal Medicine

## 2015-01-27 ENCOUNTER — Encounter: Payer: Self-pay | Admitting: Internal Medicine

## 2015-01-27 ENCOUNTER — Ambulatory Visit (INDEPENDENT_AMBULATORY_CARE_PROVIDER_SITE_OTHER)
Admission: RE | Admit: 2015-01-27 | Discharge: 2015-01-27 | Disposition: A | Discharge status: Home / Self Care | Payer: Medicare Other | Source: Ambulatory Visit | Attending: Internal Medicine | Admitting: Internal Medicine

## 2015-01-27 VITALS — BP 102/64 | HR 104 | Ht 71.0 in | Wt 133.0 lb

## 2015-01-27 DIAGNOSIS — J984 Other disorders of lung: Secondary | ICD-10-CM

## 2015-01-27 DIAGNOSIS — J449 Chronic obstructive pulmonary disease, unspecified: Secondary | ICD-10-CM

## 2015-01-27 DIAGNOSIS — F17210 Nicotine dependence, cigarettes, uncomplicated: Secondary | ICD-10-CM

## 2015-01-27 DIAGNOSIS — F17200 Nicotine dependence, unspecified, uncomplicated: Secondary | ICD-10-CM

## 2015-01-27 MED ORDER — FLUTTER DEVI: Status: DC

## 2015-01-27 NOTE — Telephone Encounter (Signed)
Dr Melvyn Novas is aware of this call  Will forward to him to document on  Thanks

## 2015-01-27 NOTE — Assessment & Plan Note (Signed)

## 2015-01-27 NOTE — Assessment & Plan Note (Addendum)
-   PFTs 04/11/2014  FEV1  1.04 (33%) ratio 40 and no better p  saba and dlco 39% ratio 59  - 04/11/2014 p extensive coaching HFA effectiveness =    75% > change spiriva to  tudorza due to dry mouth> preferred spiriva - 04/11/2014  Walked RA  2 laps @ 185 ft each stopped due to  Legs gave out, no desats - rec [facility] re[facility] 07/14/14   Despite smoking he is relatively well compensated on present rx, no changes needed   Will add flutter valve to help with secretions  The proper method of use, as well as anticipated side effects, of a metered-dose inhaler are discussed and demonstrated to the patient. Improved effectiveness after extensive coaching during this visit to a level of approximately  75% with short Ti challenging.

## 2015-01-27 NOTE — Patient Instructions (Addendum)
For cough/ congestion mucinex or mucinex dm up to 1200 mg every 12 hours as needed and use the flutter valve  I am concerned about the cavity in your Left upper lobe not healing but no other great options for now > consider evaluation at [facility] prior to getting more CT's as they should all be done at the same place   The key is to stop smoking completely before smoking completely stops you!

## 2015-01-27 NOTE — Telephone Encounter (Signed)
Spoke with patient daughter, Jorge Rosales. States that she is unable to make to the appt with her father today at 3:45 with MW. Wanted to call and give some information for the appt. Jorge Rosales states that she feels her father is not 100% honest about his health when he comes in to be seen and she wants to make sure that Dr Melvyn Novas is aware of some things going on recently. Pt is not doing well, Jorge Rosales feels that he is on a downward spiral health-wise. Does not sleep in bed, has not slept in his bed for several months. Pt sleeps in a recliner every night and pretty much stays in recliner all day. Pt sleeps in his daily clothes and shoes, refuses to change into pajamas. States that the patient denies sleeping in recliner being related to his breathing, states that his breathing is fine and he "just wants to sleep in the recliner."  Jorge Rosales states that the patient is still smoking, has constant cough and increased SOB. Pt is not eating, seems to have lost his appetite. Pt has not started Pulmonary Rehab at [facility], told her that he did not want to do it, felt like he did not need it.   Daughter is asking how he can get O2, aware that he does not qualify. I explained to her that outside of qualifying for O2 with low O2 sats (<88%) per insurance the patient will not have coverage for it.   Daughter requests that either Magda Paganini or Dr Melvyn Novas call her after the appointment today to discuss todays visit with her.   Please advise Dr Melvyn Novas. Thanks.

## 2015-01-27 NOTE — Telephone Encounter (Signed)
1) needs to stop all smoking before it's too late  2) needs another CT chest but we need to decide where it will be done as I don't see any of our guys wanting to tackle this complex a problem so if plan is to eventually seek help at [facility], let's refer him now and let them do the CT and f/u so we don't have to do the CT shuffle again.   3) if wants it done here, will need to return for labs first  4) pt does not qualify for 02 at this point  5) he wants to sleep the way he sleeps

## 2015-01-27 NOTE — Progress Notes (Signed)
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/ from [place] referred 01/22/14 to pulmonary clinic  by Dr Kaylyn Lim friend of family for eval of copd and lung nodule with GOLD III COPD by pfts 5/2-15 and proved to have a new LUL abscess not seen on CT chest at [facility] 02/18/14 and first seen on plain cxr 04/11/14     History of Present Illness  01/22/2014 1st [facility] office visit/ Wert  Maintained on spiriva x 3 years  p dx of copd by primary doctor cc new onset cough acute like the flu between xmas 2014 and [place] some better p seen in [place] UC with abx > went home and cough and sob got worse and needed more proaire dx with [facility] rx with pred and avelox and finished it and feels like he's more nearly back to baseline = spiriva each am, but now feeling proair twice daily needed vs not at all needing saba before. Did have congested cough with thick yellow mucus, never bloody, but this has resolved. Baseline = doe with inclines and if he gets in a hurry but not if he takes his time on a flat surface. Rec Symbicort 160 Take 2 puffs first thing in am and then another 2 puffs about 12 hours later.  Spiriva each am only use your albuterol (proair) as a rescue medication   The key is to stop smoking completely before smoking completely stops you!     04/11/2014 f/u ov/Wert re: GOLD III copd  Chief Complaint  Patient presents with  . Followup with PFT    Pt states that his breathing is "better, bout the same" and that his breathing is "fine'.  He states has "spells" of cough.  He states that he coughs more after he uses symbicort.   Can't lie down x 3-4 m due to sob, can't do more than slow adls or flat slow walking  Mucus occ light brown  No recent dental work rec You have a GOLD III copd with significant emphysema  that will only stabilize if you stop smoking Try off spiriva and on tudorza one twice daily immediately after symbicort and if you like it fill the rx, if not resume spiriva  Please remember to go to the  x-ray department downstairs for your tests - we will call you with the results when they are available. Please schedule a follow up visit in 3 months but call sooner if needed  Late add: augmentin bid x 20 days then cxr     05/08/2014 f/u ov/Wert re: ? Lung abscess in pt with GOLD III copd on maint rx with symbicort and spiriva Chief Complaint  Patient presents with  . Follow-up    Pt states "I feel as good as I have felt in a lonmg time". He denies any SOB or cough.     Did not take augmentin correctly  Not limited by breathing from desired activities though not very active  No purulent sputum  Not needing cough suppression or prn saba rec You will likely need bronchoscopy and the best approach would be to have this taken care of by your doctors there or bring all your scans here for review > [facility] In the meantime I recommend Augmentin 875 mg  take one pill twice daily  X 20 days -       07/14/2014 f/u ov/Wert re: LUL cavity/ copd GOLD III/ still smoking / on symbicort/spiriva maint rx  Chief Complaint  Patient presents with  . Follow-up    Pt states he has occasional dry cough, not any worse.   Not limited by breathing from desired activities  - overall feeling much better, no need for saba rec Will need actual copies of actual cxr's and CT scans when you return from [place] Please remember to go to the x-ray department downstairs for your tests - we will call you with the results when they are available. Call if you would like [place] to refer you to pulmonary rehab at [facility]   11/03/2014 f/u ov/Wert re: GOLD III copd/ s/p lung abscess Chief Complaint  Patient presents with  . Follow-up    Pt states doing well and denies any new co's today.   Not limited by breathing from  desired activities but very sedentary / no need for saba on spiriva and symbicort maint rx  rec Please remember to go to the  x-ray department downstairs for your tests - we will call you with the results when they are available. Please schedule a follow up visit in 3 months but call sooner if needed    Will need actual copies of actual cxr's and CT scans when you return from [place] Please remember to go to the x-ray department downstairs for your tests - we will call you with the results when they are available. Call if you would like [place] to refer you to pulmonary rehab at [place] is to stop smoking completely before smoking completely stops you!    01/27/2015 f/u ov/Wert re: LUL cavity c/w abscess GOLD III copd/ still smoking Chief Complaint  Patient presents with  . Follow-up    CXR done. Pt c/o increased cough and SOB for the past 2-3 wks. Cough is prod with minimal white sputum. He states get SOB walking approx 50 yards.   No hemoptysis/ no putrid sputum/ no better p zpak and pred called into [facility] 02/09/15    No obvious day to day or daytime variabilty or assoc   or cp or chest tightness, subjective wheeze overt sinus or hb symptoms. No unusual exp hx or h/o childhood pna/ asthma or knowledge of premature birth.  Sleeping ok in recliner chronically  ("cause he wants to")without nocturnal  or early am exacerbation  of respiratory  c/o's or need for noct saba. Also denies any obvious fluctuation of symptoms with weather or environmental changes or other aggravating or alleviating factors except as outlined above   Current Medications, Allergies, Complete Past Medical History, Past Surgical History, Family History, and Social History were reviewed in [employer] record.  ROS  The following are not active complaints unless bolded sore throat, dysphagia, dental problems, itching, sneezing,  nasal congestion or excess/ purulent secretions, ear ache,   fever,  chills, sweats, unintended wt loss, pleuritic or exertional cp, hemoptysis,  orthopnea pnd or leg swelling, presyncope, palpitations, heartburn, abdominal pain, anorexia, nausea, vomiting, diarrhea  or change in bowel or urinary habits, change in stools or urine, dysuria,hematuria,  rash, arthralgias, visual complaints, headache, numbness weakness or ataxia or problems with walking or coordination,  change in mood/affect or memory.              Objective:   Physical Exam  amb wm nad   04/11/2014  129 >  05/08/2014  137 > 06/06/2014 144  > 07/14/2014 145 > 11/03/14 137 > 01/27/2015  133  Wt Readings from Last 3 Encounters:  01/22/14 154 lb 9.6 oz (70.126 kg)        HEENT mild turbinate edema.  Oropharynx no thrush or excess pnd or cobblestoning.  No JVD or cervical adenopathy. Mild accessory muscle hypertrophy. Trachea midline, nl thryroid. Chest was hyperinflated by percussion with diminished breath sounds and moderate increased exp time without wheeze. Amphoric bs on L posteriorly.  Hoover sign positive at mid inspiration. Regular rate and rhythm without murmur gallop or rub or increase P2 or edema.  Abd: no hsm, nl excursion. Ext warm without cyanosis or clubbing.      cx[facility] 01/10/14 from [facility] 1. Hyperinflated lungs suggest underlying small airways disease with query some minimal edema or bronchitis. 2. Likely calcified nodule in the left lower lobe or lingula. 3. Degenerative changes of the thoracic spine. 4. Pleural spaces are within normal limits.     CXR PA and Lateral:   01/27/2015 :     I personally reviewed images and agree with radiology impression as follows:    Similar appearance of left upper lobe cavitary lesion with persisting air-fluid level, with evidence of hilar adenopathy and left lower lung nodule.    Assessment & Plan:  .

## 2015-01-27 NOTE — Telephone Encounter (Signed)
I called and spoke with the pt's daughter and notified of all recs per MW  She verbalized understanding  Nothing further needed

## 2015-02-05 ENCOUNTER — Telehealth: Payer: Self-pay | Admitting: Internal Medicine

## 2015-02-05 NOTE — Telephone Encounter (Signed)
LMTCB

## 2015-02-06 NOTE — Telephone Encounter (Signed)
lmomtcb x 2  

## 2015-02-09 NOTE — Telephone Encounter (Signed)
lmtcb x3 for [employer]

## 2015-02-27 ENCOUNTER — Telehealth: Payer: Self-pay

## 2015-02-27 NOTE — Telephone Encounter (Signed)
02/27/15 Disc received from [place] and filed on shelf.[place]

## 2016-02-12 NOTE — Telephone Encounter (Signed)
Pt requesting handicap placard.   Please call pt to discuss pt p#[phone]

## 2016-02-12 NOTE — Telephone Encounter (Signed)
ok 

## 2016-02-12 NOTE — Telephone Encounter (Signed)
Called patient to advise that handicap placcard form is ready at [facility].

## 2016-06-28 ENCOUNTER — Telehealth

## 2016-06-28 NOTE — Telephone Encounter (Signed)
Mr. Deloe he lives close to [place] area and he is calling as he is having an issue since he had a fall a week. He has punch wound injury at the bottom of the scar on rt hip where Dr. 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 He has been treated by a physician at [facility] and was given an antiobidic but no xrays.There has been no stitches with wound injury.He also went to refree standing facility and was given another series of antibiodics for the wound that is draining.  He is in [place] and saw an Ortho Surgeon yesterday who did take xyrs and the hip replacement is in good alignment with no infection drainage is clear but the drainage has not stopped. Wound is right at the joint and it depends on when he moves drainage will start again. Ortho Surgeon in [place] [place] did tell Mr. Salaz to contact Dr. Meyer Cory to see if he can recommend a physician near the [place] area. Please call Mr.Sunde back as he needs to speak with Dr. Meyer Cory  Mr. Clinkscales can be reached at [phone].

## 2016-06-29 NOTE — Telephone Encounter (Signed)
We usually refer to [facility] for patients in that area.    Dr Carolan Clines

## 2016-06-29 NOTE — Telephone Encounter (Signed)
Referral entered to [facility] per PA [facility].

## 2016-06-29 NOTE — Telephone Encounter (Signed)
Spoke with patient and informed him that we have put in a referral into [employer]

## 2016-06-29 NOTE — Telephone Encounter (Signed)
Patient returning call ref message of puncture would taken yesterday.  He would like for Dr. Meyer Cory or nurse to call him today ref where to go for treatment of the puncture wound. [phone]

## 2016-08-02 NOTE — Telephone Encounter (Signed)
Patient called to request an updated referral to Dr. Gershon Mussel'Malley @ [facility].  He says he started feeling better and canceled the previous appointment.  Please advise patient if he's ok to reschedule with them at [phone]

## 2016-08-03 NOTE — Telephone Encounter (Signed)
Made appt and called patient with the info

## 2021-03-19 NOTE — Telephone Encounter (Signed)
Patient called stating he had an hip replacement done back in 2012 & patient stated he needed his handicap sticker updated. Patient is requesting a call back [phone]

## 2021-03-20 NOTE — Telephone Encounter (Signed)
Ok for handicap placard

## 2021-03-24 NOTE — Telephone Encounter (Signed)
Patients call returned, he states he has a [address] form for [employer] and he will mail it today, and upon receiving and signing, will mail back to him.

## 2021-04-15 NOTE — Telephone Encounter (Signed)
Patient states he mailed the form as he said he would to the [facility] location around the first of May.  He has yet to receive the form or hear of a status.  Please inform if we received the form and when it is completed.      Patient [phone]

## 2021-04-16 NOTE — Telephone Encounter (Signed)
I called the patient regarding the DMV form and he said [place] called [facility] today and told him she had mailed it to him.

## 2021-05-15 ENCOUNTER — Other Ambulatory Visit: Payer: Self-pay

## 2021-05-15 ENCOUNTER — Emergency Department: Payer: Medicare Other

## 2021-05-15 ENCOUNTER — Inpatient Hospital Stay
Admission: EM | Admit: 2021-05-15 | Discharge: 2021-05-16 | DRG: 280 | Disposition: A | Discharge status: Other Acute Inpatient Hospital | Payer: Medicare Other | Attending: Internal Medicine | Admitting: Internal Medicine

## 2021-05-15 DIAGNOSIS — F17210 Nicotine dependence, cigarettes, uncomplicated: Secondary | ICD-10-CM | POA: Diagnosis present

## 2021-05-15 DIAGNOSIS — Z8249 Family history of ischemic heart disease and other diseases of the circulatory system: Secondary | ICD-10-CM

## 2021-05-15 DIAGNOSIS — Z86711 Personal history of pulmonary embolism: Secondary | ICD-10-CM

## 2021-05-15 DIAGNOSIS — I2699 Other pulmonary embolism without acute cor pulmonale: Secondary | ICD-10-CM | POA: Diagnosis present

## 2021-05-15 DIAGNOSIS — I2694 Multiple subsegmental pulmonary emboli without acute cor pulmonale: Secondary | ICD-10-CM | POA: Diagnosis present

## 2021-05-15 DIAGNOSIS — Z96643 Presence of artificial hip joint, bilateral: Secondary | ICD-10-CM | POA: Diagnosis present

## 2021-05-15 DIAGNOSIS — J441 Chronic obstructive pulmonary disease with (acute) exacerbation: Secondary | ICD-10-CM | POA: Diagnosis present

## 2021-05-15 DIAGNOSIS — R0602 Shortness of breath: Secondary | ICD-10-CM | POA: Diagnosis not present

## 2021-05-15 DIAGNOSIS — I214 Non-ST elevation (NSTEMI) myocardial infarction: Secondary | ICD-10-CM | POA: Diagnosis not present

## 2021-05-15 DIAGNOSIS — Z79899 Other long term (current) drug therapy: Secondary | ICD-10-CM

## 2021-05-15 DIAGNOSIS — Z20822 Contact with and (suspected) exposure to covid-19: Secondary | ICD-10-CM | POA: Diagnosis present

## 2021-05-15 DIAGNOSIS — J9601 Acute respiratory failure with hypoxia: Secondary | ICD-10-CM | POA: Diagnosis present

## 2021-05-15 DIAGNOSIS — Z801 Family history of malignant neoplasm of trachea, bronchus and lung: Secondary | ICD-10-CM

## 2021-05-15 HISTORY — DX: Personal history of pulmonary embolism: Z86.711

## 2021-05-15 LAB — APTT: aPTT: 38 seconds — ABNORMAL HIGH (ref 24–36)

## 2021-05-15 LAB — CBC WITH DIFFERENTIAL/PLATELET
Abs Immature Granulocytes: 0.14 10*3/uL — ABNORMAL HIGH (ref 0.00–0.07)
Basophils Absolute: 0.1 10*3/uL (ref 0.0–0.1)
Basophils Relative: 0 %
Eosinophils Absolute: 0 10*3/uL (ref 0.0–0.5)
Eosinophils Relative: 0 %
HCT: 47.4 % (ref 39.0–52.0)
Hemoglobin: 16.5 g/dL (ref 13.0–17.0)
Immature Granulocytes: 1 %
Lymphocytes Relative: 4 %
Lymphs Abs: 0.9 10*3/uL (ref 0.7–4.0)
MCH: 32.9 pg (ref 26.0–34.0)
MCHC: 34.8 g/dL (ref 30.0–36.0)
MCV: 94.6 fL (ref 80.0–100.0)
Monocytes Absolute: 1.5 10*3/uL — ABNORMAL HIGH (ref 0.1–1.0)
Monocytes Relative: 6 %
Neutro Abs: 20.1 10*3/uL — ABNORMAL HIGH (ref 1.7–7.7)
Neutrophils Relative %: 89 %
Platelets: 373 10*3/uL (ref 150–400)
RBC: 5.01 MIL/uL (ref 4.22–5.81)
RDW: 12.6 % (ref 11.5–15.5)
WBC: 22.7 10*3/uL — ABNORMAL HIGH (ref 4.0–10.5)
nRBC: 0 % (ref 0.0–0.2)

## 2021-05-15 LAB — BLOOD GAS, VENOUS
Acid-Base Excess: 1.9 mmol/L (ref 0.0–2.0)
Bicarbonate: 28.3 mmol/L — ABNORMAL HIGH (ref 20.0–28.0)
O2 Saturation: 65.2 %
Patient temperature: 37
pCO2, Ven: 49 mmHg (ref 44.0–60.0)
pH, Ven: 7.37 (ref 7.250–7.430)
pO2, Ven: 35 mmHg (ref 32.0–45.0)

## 2021-05-15 LAB — PROCALCITONIN: Procalcitonin: <0.1 ng/mL

## 2021-05-15 LAB — COMPREHENSIVE METABOLIC PANEL
ALT: 20 U/L (ref 0–44)
AST: 24 U/L (ref 15–41)
Albumin: 3.5 g/dL (ref 3.5–5.0)
Alkaline Phosphatase: 127 U/L — ABNORMAL HIGH (ref 38–126)
Anion gap: 9 (ref 5–15)
BUN: 16 mg/dL (ref 8–23)
CO2: 26 mmol/L (ref 22–32)
Calcium: 8.4 mg/dL — ABNORMAL LOW (ref 8.9–10.3)
Chloride: 97 mmol/L — ABNORMAL LOW (ref 98–111)
Creatinine, Ser: 0.62 mg/dL (ref 0.61–1.24)
GFR, Estimated: >60 mL/min (ref >60)
Glucose, Bld: 121 mg/dL — ABNORMAL HIGH (ref 70–99)
Potassium: 3.8 mmol/L (ref 3.5–5.1)
Sodium: 132 mmol/L — ABNORMAL LOW (ref 135–145)
Total Bilirubin: 1.1 mg/dL (ref 0.3–1.2)
Total Protein: 8 g/dL (ref 6.5–8.1)

## 2021-05-15 LAB — D-DIMER, QUANTITATIVE: D-Dimer, Quant: 2.76 ug/mL-FEU — ABNORMAL HIGH (ref 0.00–0.50)

## 2021-05-15 LAB — RESP PANEL BY RT-PCR (FLU A&B, COVID) ARPGX2
Influenza A by PCR: NEGATIVE
Influenza B by PCR: NEGATIVE
SARS Coronavirus 2 by RT PCR: NEGATIVE

## 2021-05-15 LAB — PROTIME-INR
INR: 1.1 (ref 0.8–1.2)
Prothrombin Time: 13.8 seconds (ref 11.4–15.2)

## 2021-05-15 LAB — TROPONIN I (HIGH SENSITIVITY)
Troponin I (High Sensitivity): 937 ng/L (ref <18)
Troponin I (High Sensitivity): 1150 ng/L (ref <18)

## 2021-05-15 LAB — MAGNESIUM: Magnesium: 1.9 mg/dL (ref 1.7–2.4)

## 2021-05-15 LAB — LACTIC ACID, PLASMA
Lactic Acid, Venous: 1.4 mmol/L (ref 0.5–1.9)
Lactic Acid, Venous: 2.2 mmol/L (ref 0.5–1.9)

## 2021-05-15 MED ORDER — HEPARIN (PORCINE) 25000 UT/250ML-% IV SOLN
750.0000 [IU]/h | INTRAVENOUS | Status: DC
  Administered 2021-05-15: 750 [IU]/h via INTRAVENOUS
  Filled 2021-05-15: qty 250

## 2021-05-15 MED ORDER — IPRATROPIUM-ALBUTEROL 0.5-2.5 (3) MG/3ML IN SOLN
9.0000 mL | Freq: Once | RESPIRATORY_TRACT | Status: AC
Start: 2021-05-15 — End: 2021-05-15
  Administered 2021-05-15: 9 mL via RESPIRATORY_TRACT
  Filled 2021-05-15: qty 3

## 2021-05-15 MED ORDER — SODIUM CHLORIDE 0.9 % IV SOLN
1.0000 g | Freq: Once | INTRAVENOUS | Status: AC
  Administered 2021-05-15: 1 g via INTRAVENOUS
  Filled 2021-05-15: qty 10

## 2021-05-15 MED ORDER — SODIUM CHLORIDE 0.9 % IV SOLN
500.0000 mg | Freq: Once | INTRAVENOUS | Status: AC
  Administered 2021-05-15: 500 mg via INTRAVENOUS
  Filled 2021-05-15: qty 500

## 2021-05-15 MED ORDER — LACTATED RINGERS IV BOLUS
1000.0000 mL | Freq: Once | INTRAVENOUS | Status: AC
  Administered 2021-05-15: 1000 mL via INTRAVENOUS

## 2021-05-15 MED ORDER — HEPARIN BOLUS VIA INFUSION
3800.0000 [IU] | Freq: Once | INTRAVENOUS | Status: AC
  Administered 2021-05-15: 3800 [IU] via INTRAVENOUS
  Filled 2021-05-15: qty 3800

## 2021-05-15 MED ORDER — ASPIRIN 81 MG PO CHEW
324.0000 mg | CHEWABLE_TABLET | Freq: Once | ORAL | Status: AC
  Administered 2021-05-15: 324 mg via ORAL
  Filled 2021-05-15: qty 4

## 2021-05-15 MED ORDER — IOHEXOL 350 MG/ML SOLN
75.0000 mL | Freq: Once | INTRAVENOUS | Status: AC | PRN
  Administered 2021-05-15: 75 mL via INTRAVENOUS

## 2021-05-15 NOTE — Progress Notes (Signed)
Elink following for Sepsis Protocol 

## 2021-05-15 NOTE — ED Provider Notes (Signed)
[facility] Emergency Department Provider Note  ____________________________________________   Event Date/Time   First MD Initiated Contact with Patient 05/15/21 1920     (approximate)  I have reviewed the triage vital signs and the nursing notes.   HISTORY  Chief Complaint Shortness of Breath   HPI Jorge Rosales is a 75 y.o. male with a past medical history of COPD and remote tobacco abuse who presents for assessment of acute worsening of shortness of breath that has been getting worse over the last 2 weeks.  States he has had a cough and some shortness of breath on and off for the last 2 weeks worsened by exertion but seem to get much worse today.  He denies any fevers, chest pain, Donnell pain, back pain, headache, earache, sore throat, vomiting, Derica dysuria, rash or any other clear acute sick symptoms.  He did receive duo nebs and Solu-Medrol with EMS.  He states this helped a [place] bit.  States he was on oxygen several years ago but stopped during this for 5 years ago because he felt like he did not need it.  He has not been on any oxygen since then.  Per EMS his initial sats were in the 8s.  Denies any illicit drug use or regular EtOH use.         Past Medical History:  Diagnosis Date   Chronic bronchitis ([place])    COPD (chronic obstructive pulmonary disease) ([place])    History of colon surgery 1967    Patient Active Problem List   Diagnosis Date Noted   Pulmonary cavitary lesion 04/11/2014   Smoker 01/23/2014   Solitary pulmonary nodule 01/23/2014   COPD GOLD III 01/22/2014    Past Surgical History:  Procedure Laterality Date   BIL hip replacement  2013   BOWEL RESECTION  1967    Prior to Admission medications   Medication Sig Start Date End Date Taking? Authorizing Provider  albuterol (PROAIR HFA) 108 (90 BASE) MCG/ACT inhaler 2 puffs every 4 hours as needed only  if your can't catch your breath 11/04/14   Tanda Rockers, MD   budesonide-formoterol [facility]) 160-4.5 MCG/ACT inhaler Take 2 puffs first thing in am and then another 2 puffs about 12 hours later. 02/26/14   Tanda Rockers, MD  carbamazepine (TEGRETOL) 200 MG tablet Take 200 mg by mouth 3 (three) times daily.    [provider]  Megestrol Acetate (MEGACE ORAL PO) Take 1 tablet by mouth daily.    [provider]  PARoxetine (PAXIL-CR) 25 MG 24 hr tablet Take 1 tablet by mouth daily. 10/13/14   [provider]  Respiratory Therapy Supplies (FLUTTER) DEVI Use as directed 01/27/15   Tanda Rockers, MD  tiotropium (SPIRIVA HANDIHALER) 18 MCG inhalation capsule Place 18 mcg into inhaler and inhale daily.    [provider]    Allergies Patient has no known allergies.  Family History  Problem Relation Age of Onset   Heart disease Father    Lung cancer Father     Social History Social History   Tobacco Use   Smoking status: Every Day    Packs/day: 1.00    Years: 49.00    Pack years: 49.00    Types: Cigarettes    Start date: 11/28/1964  Substance Use Topics   Alcohol use: Yes    Comment: 2 beers/day    Review of Systems  Review of Systems  Constitutional:  Positive for malaise/fatigue. Negative for chills  and fever.  HENT:  Negative for sore throat.   Eyes:  Negative for pain.  Respiratory:  Positive for cough and shortness of breath. Negative for stridor.   Cardiovascular:  Positive for palpitations. Negative for chest pain.  Gastrointestinal:  Negative for vomiting.  Genitourinary:  Negative for dysuria.  Musculoskeletal:  Negative for myalgias.  Skin:  Negative for rash.  Neurological:  Negative for seizures, loss of consciousness and headaches.  Psychiatric/Behavioral:  Negative for suicidal ideas.   All other systems reviewed and are negative.    ____________________________________________   PHYSICAL EXAM:  VITAL SIGNS: ED Triage Vitals  Enc Vitals Group     BP      Pulse      Resp       Temp      Temp src      SpO2      Weight      Height      Head Circumference      Peak Flow      Pain Score      Pain Loc      Pain Edu?      Excl. in [place]?    Vitals:   05/15/21 2100 05/15/21 2213  BP: (!) 150/83 (!) 141/75  Pulse: (!) 117 (!) 113  Resp: (!) 31 18  Temp:    SpO2: 100% 95%   Physical Exam Vitals and nursing note reviewed.  Constitutional:      General: He is in acute distress.     Appearance: He is well-developed. He is ill-appearing.  HENT:     Head: Normocephalic and atraumatic.     Right Ear: External ear normal.     Left Ear: External ear normal.     Nose: Nose normal.  Eyes:     Conjunctiva/sclera: Conjunctivae normal.  Cardiovascular:     Rate and Rhythm: Regular rhythm. Tachycardia present.     Heart sounds: No murmur heard. Pulmonary:     Effort: Tachypnea, accessory muscle usage and respiratory distress present.     Breath sounds: Decreased breath sounds and wheezing present.  Abdominal:     Palpations: Abdomen is soft.     Tenderness: There is no abdominal tenderness.  Musculoskeletal:     Cervical back: Neck supple.  Skin:    General: Skin is warm and dry.  Neurological:     General: No focal deficit present.     Mental Status: He is alert.  Psychiatric:        Mood and Affect: Mood normal.     ____________________________________________   LABS (all labs ordered are listed, but only abnormal results are displayed)  Labs Reviewed  BLOOD GAS, VENOUS - Abnormal; Notable for the following components:      Result Value   Bicarbonate 28.3 (*)    All other components within normal limits  CBC WITH DIFFERENTIAL/PLATELET - Abnormal; Notable for the following components:   WBC 22.7 (*)    Neutro Abs 20.1 (*)    Monocytes Absolute 1.5 (*)    Abs Immature Granulocytes 0.14 (*)    All other components within normal limits  COMPREHENSIVE METABOLIC PANEL - Abnormal; Notable for the following components:   Sodium 132 (*)    Chloride 97  (*)    Glucose, Bld 121 (*)    Calcium 8.4 (*)    Alkaline Phosphatase 127 (*)    All other components within normal limits  D-DIMER, QUANTITATIVE - Abnormal; Notable for the following components:  D-Dimer, Quant 2.76 (*)    All other components within normal limits  LACTIC ACID, PLASMA - Abnormal; Notable for the following components:   Lactic Acid, Venous 2.2 (*)    All other components within normal limits  APTT - Abnormal; Notable for the following components:   aPTT 38 (*)    All other components within normal limits  TROPONIN I (HIGH SENSITIVITY) - Abnormal; Notable for the following components:   Troponin I (High Sensitivity) 937 (*)    All other components within normal limits  TROPONIN I (HIGH SENSITIVITY) - Abnormal; Notable for the following components:   Troponin I (High Sensitivity) 1,150 (*)    All other components within normal limits  RESP PANEL BY RT-PCR (FLU A&B, COVID) ARPGX2  CULTURE, BLOOD (SINGLE)  MAGNESIUM  PROCALCITONIN  LACTIC ACID, PLASMA  PROTIME-INR  URINALYSIS, COMPLETE (UACMP) WITH MICROSCOPIC  HEPARIN LEVEL (UNFRACTIONATED)  CBC   ____________________________________________  EKG  Sinus tachycardia ventricular of 121, unremarkable intervals, and nonspecific changes throughout without other clearance of acute ischemia ____________________________________________  RADIOLOGY  ED MD interpretation: Chest x-ray remarkable for left upper lobe opacity with apparent possible air-fluid level without evidence of pneumothorax.  No large effusion.  CTA chest remarkable for positive PE LE submassive.  There is also scarring and consolidation of her lungs consistent with possible pneumonia.  There is also evidence of emphysema and aortic atherosclerosis.  Official radiology report(s): CT Angio Chest PE W and/or Wo Contrast  Result Date: 05/15/2021 CLINICAL DATA:  Pulmonary embolus suspected with high probability. EXAM: CT ANGIOGRAPHY CHEST WITH CONTRAST  TECHNIQUE: Multidetector CT imaging of the chest was performed using the standard protocol during bolus administration of intravenous contrast. Multiplanar CT image reconstructions and MIPs were obtained to evaluate the vascular anatomy. CONTRAST:  32mL OMNIPAQUE IOHEXOL 350 MG/ML SOLN COMPARISON:  Chest radiograph 05/15/2021 FINDINGS: Cardiovascular: There is good opacification of the central and segmental pulmonary arteries. Filling defects are demonstrated in the distal right lobar artery, extending into upper and lower lobe segmental branches. Filling defects are also demonstrated in left upper lung segmental and subsegmental branches. Changes are consistent with acute pulmonary embolus. RV to LV ratio is 1.2. No pericardial effusions. Normal caliber thoracic aorta. No evidence of aortic dissection. Mild coronary artery and aortic calcification. Mediastinum/Nodes: Esophagus is decompressed. Filling defect in the trachea likely representing mucoid material. No significant lymphadenopathy. Calcified lymph nodes are present in the mediastinum and left hilum. Lungs/Pleura: Emphysematous changes in the lungs. Linear scarring and consolidation in the left upper lung. Focal pleural thickening and scarring in the right upper lung. Scattered calcified granulomas. No pleural effusions. Upper Abdomen: No acute abnormalities demonstrated in the visualized upper abdomen. Musculoskeletal: Degenerative changes throughout the spine. No destructive bone lesions. Review of the MIP images confirms the above findings. IMPRESSION: 1. Positive for acute PE with CT evidence of right heart strain (RV/LV Ratio = 1.2) consistent with at least submassive (intermediate risk) PE. The presence of right heart strain has been associated with an increased risk of morbidity and mortality. Please refer to the "PE Focused" order set in EPIC. 2. Scarring and consolidation in the upper lungs, greater on the left. This is likely chronic process,  possibly postinflammatory. 3. Emphysematous changes in the lungs. 4. Aortic atherosclerosis. Critical Value/emergent results were called by telephone at the time of interpretation on 05/15/2021 at 9:53 pm to provider [facility] , who verbally acknowledged these results. Electronically Signed   By: Lucienne Capers M.D.   On:  05/15/2021 21:56   DG Chest Portable 1 View  Result Date: 05/15/2021 CLINICAL DATA:  Shortness of breath for 2 weeks.  COPD history. EXAM: PORTABLE CHEST 1 VIEW COMPARISON:  Most recent imaging radiograph 01/27/2015 FINDINGS: Patient is rotated. Heart is normal in size. Questionable left hilar retraction. An area of previous left upper lobe opacity with air-fluid levels no demonstrates streaky densities with some architectural distortion, favoring scarring but nonspecific. Lungs are hyperinflated with bronchial thickening. No pneumothorax or large pleural effusion. No acute osseous abnormalities are seen. IMPRESSION: 1. Area of previous left upper lobe opacity with air-fluid levels demonstrates streaky densities with architectural distortion, nonspecific. Findings favor scarring, however given lack of interval exams to demonstrate stability, unless performed elsewhere, chest CT (with IV contrast for hilar assessment) is recommended for further assessment. This could be performed on an elective basis. 2. Hyperinflation and bronchial thickening, imaging findings consistent with COPD. Electronically Signed   By: Keith Rake M.D.   On: 05/15/2021 19:58    ____________________________________________   PROCEDURES  Procedure(s) performed (including Critical Care):  .Critical Care  Date/Time: 05/15/2021 11:21 PM Performed by: Lucrezia Starch, MD Authorized by: Lucrezia Starch, MD   Critical care provider statement:    Critical care time (minutes):  75   Critical care was necessary to treat or prevent imminent or life-threatening deterioration of the following conditions:   Respiratory failure and cardiac failure   Critical care was time spent personally by me on the following activities:  Discussions with consultants, evaluation of patient's response to treatment, examination of patient, ordering and performing treatments and interventions, ordering and review of laboratory studies, ordering and review of radiographic studies, pulse oximetry, re-evaluation of patient's condition, obtaining history from patient or surrogate and review of old charts   ____________________________________________   INITIAL IMPRESSION / [place] / ED COURSE      Patient presents with above-stated history and exam for assessment of cute onset of shortness of breath associate with cough.  He was reported hypoxic with EMS.  He did receive duo nebs and Solu-Medrol prior to arrival.  On arrival patient's room air sat is 86%.  He is tachypneic at 32, tachycardic at 122 and afebrile with normal blood pressure.  He has diffuse and diminished breath sounds bilaterally.  He appears to be in fair amount of respiratory stress.  Patient immediately placed on BiPAP for work of breathing.  Chest x-ray with findings concerning for possible pneumonia in the setting of chronic  CTA chest remarkable for positive PE LE submassive.  There is also scarring and consolidation of her lungs consistent with possible pneumonia.  There is also evidence of emphysema and aortic atherosclerosis.  VBG without evidence of acute hypercarbic respiratory failure.  COVID i CMP without significant electrolyte or metabolic derangements.  COVID and influenza PCR is negative.  CBC with WBC count 22.7 normal hemoglobin.  D-dimer is elevated at 2.76.  Initial troponin elevated at 937.  Magnesium WNL.  Procalcitonin is undetectable.  Given evidence on CTA of multiple pulmonary emboli causing right heart strain and evidence of NSTEMI patient given ASA and start on heparin.  I did reach out to interventional radiology Dr.  Vernard Gambles who reviewed patient's ED work-up including imaging and recommended transfer to [facility] where he could be evaluated for interventional procedure.  Patient was accepted by Dr. Celesta Aver.  He will be transferred for further evaluation management.  While awaiting transfer he was weaned off BiPAP to 6 L nasal  cannula.  He also stated he felt much better on my reassessment.  I explained concerns for large blood clot causing his symptoms today and plan for transfer.  Daughter at bedside are aware and amenable with this plan. ____________________________________________   FINAL CLINICAL IMPRESSION(S) / ED DIAGNOSES  Final diagnoses:  Multiple subsegmental pulmonary emboli without acute cor pulmonale (HCC)  Acute respiratory failure with hypoxia (HCC)  NSTEMI (non-ST elevated myocardial infarction) (HCC)  COPD exacerbation (HCC)    Medications  heparin ADULT infusion 100 units/mL (25000 units/237mL) (750 Units/hr Intravenous New Bag/Given 05/15/21 2247)  ipratropium-albuterol (DUONEB) 0.5-2.5 (3) MG/3ML nebulizer solution 9 mL (9 mLs Nebulization Given 05/15/21 1938)  cefTRIAXone (ROCEPHIN) 1 g in sodium chloride 0.9 % 100 mL IVPB (0 g Intravenous Stopped 05/15/21 2109)  azithromycin (ZITHROMAX) 500 mg in sodium chloride 0.9 % 250 mL IVPB (500 mg Intravenous New Bag/Given 05/15/21 2213)  aspirin chewable tablet 324 mg (324 mg Oral Given 05/15/21 2049)  lactated ringers bolus 1,000 mL (0 mLs Intravenous Stopped 05/15/21 2139)  iohexol (OMNIPAQUE) 350 MG/ML injection 75 mL (75 mLs Intravenous Contrast Given 05/15/21 2136)  heparin bolus via infusion 3,800 Units (3,800 Units Intravenous Bolus from Bag 05/15/21 2247)     ED Discharge Orders     None        Note:  This document was prepared using Dragon voice recognition software and may include unintentional dictation errors.    Lucrezia Starch, MD 05/15/21 [phone]

## 2021-05-15 NOTE — ED Triage Notes (Signed)
Pt presents to ER via ems c/o SOB x 2 weeks, but has become worse in last 2 hrs.  Pt has hx COPD and attempted inhalers and O2 at home without relief.  Initial O2 sats 80% on RA.  Pt noted to have accessory muscle use, diminished lung sounds throughout, and cough with green sputum.  Pt given 125 mg solu-medrol and 2 duo-nebs with ems.  Pt A&O x4, but in obvious distress at this time.

## 2021-05-15 NOTE — Consult Note (Signed)
ANTICOAGULATION CONSULT NOTE  Pharmacy Consult for Heparin Indication: chest pain/ACS  No Known Allergies  Patient Measurements: Height: 5\' 10"  (177.8 cm) Weight: 63.5 kg (140 lb) IBW/kg (Calculated) : 73 Heparin Dosing Weight: 63.5 kg  Vital Signs: Temp: 98.1 F (36.7 C) (06/18 1930) Temp Source: Oral (06/18 1930) BP: 118/79 (06/18 2030) Pulse Rate: 125 (06/18 2030)  Labs: Recent Labs    05/15/21 1946  HGB 16.5  HCT 47.4  PLT 373  CREATININE 0.62  TROPONINIHS 937*    Estimated Creatinine Clearance: 72.8 mL/min (by C-G formula based on SCr of 0.62 mg/dL).   Medical History: Past Medical History:  Diagnosis Date   Chronic bronchitis (HCC)    COPD (chronic obstructive pulmonary disease) (HCC)    History of colon surgery 1967    Medications:  No PTA anticoagulation or antiplatelet medications  Assessment: 75 y.o. male with a past medical history of COPD who presents with worsening of shortness of breath. Patient was found to have troponin elevated to 937. Pharmacy has been consulted for heparin dosing.  Hgb and plts WNL Heparin Dosing Weight: 63.5 kg  Goal of Therapy:  Heparin level 0.3-0.7 units/ml Monitor platelets by anticoagulation protocol: Yes   Plan:  Give 3800 units bolus x 1 Start heparin infusion at 750 units/hr Check anti-Xa level in 8 hours and daily while on heparin Continue to monitor H&H and platelets  Darnelle Bos, PharmD 05/15/2021,8:53 PM

## 2021-05-16 ENCOUNTER — Inpatient Hospital Stay (HOSPITAL_COMMUNITY)
Admission: AD | Admit: 2021-05-16 | Discharge: 2021-05-19 | DRG: 175 | Disposition: A | Discharge status: Home / Self Care | Payer: Medicare Other | Source: Other Acute Inpatient Hospital | Attending: Internal Medicine | Admitting: Internal Medicine

## 2021-05-16 ENCOUNTER — Inpatient Hospital Stay (HOSPITAL_COMMUNITY): Payer: Medicare Other

## 2021-05-16 ENCOUNTER — Encounter (HOSPITAL_COMMUNITY): Payer: Self-pay | Admitting: Internal Medicine

## 2021-05-16 DIAGNOSIS — Z801 Family history of malignant neoplasm of trachea, bronchus and lung: Secondary | ICD-10-CM | POA: Diagnosis not present

## 2021-05-16 DIAGNOSIS — T380X5A Adverse effect of glucocorticoids and synthetic analogues, initial encounter: Secondary | ICD-10-CM | POA: Diagnosis not present

## 2021-05-16 DIAGNOSIS — Z20822 Contact with and (suspected) exposure to covid-19: Secondary | ICD-10-CM | POA: Diagnosis present

## 2021-05-16 DIAGNOSIS — I214 Non-ST elevation (NSTEMI) myocardial infarction: Secondary | ICD-10-CM | POA: Diagnosis present

## 2021-05-16 DIAGNOSIS — Z8249 Family history of ischemic heart disease and other diseases of the circulatory system: Secondary | ICD-10-CM | POA: Diagnosis not present

## 2021-05-16 DIAGNOSIS — I2694 Multiple subsegmental pulmonary emboli without acute cor pulmonale: Secondary | ICD-10-CM | POA: Diagnosis present

## 2021-05-16 DIAGNOSIS — J9601 Acute respiratory failure with hypoxia: Secondary | ICD-10-CM | POA: Diagnosis present

## 2021-05-16 DIAGNOSIS — R0602 Shortness of breath: Secondary | ICD-10-CM | POA: Diagnosis present

## 2021-05-16 DIAGNOSIS — J441 Chronic obstructive pulmonary disease with (acute) exacerbation: Secondary | ICD-10-CM | POA: Diagnosis present

## 2021-05-16 DIAGNOSIS — F17210 Nicotine dependence, cigarettes, uncomplicated: Secondary | ICD-10-CM | POA: Diagnosis present

## 2021-05-16 DIAGNOSIS — I2699 Other pulmonary embolism without acute cor pulmonale: Principal | ICD-10-CM | POA: Diagnosis present

## 2021-05-16 DIAGNOSIS — D72829 Elevated white blood cell count, unspecified: Secondary | ICD-10-CM | POA: Diagnosis not present

## 2021-05-16 DIAGNOSIS — I2609 Other pulmonary embolism with acute cor pulmonale: Secondary | ICD-10-CM

## 2021-05-16 DIAGNOSIS — Z7951 Long term (current) use of inhaled steroids: Secondary | ICD-10-CM

## 2021-05-16 DIAGNOSIS — J449 Chronic obstructive pulmonary disease, unspecified: Secondary | ICD-10-CM | POA: Diagnosis present

## 2021-05-16 DIAGNOSIS — Z96643 Presence of artificial hip joint, bilateral: Secondary | ICD-10-CM | POA: Diagnosis present

## 2021-05-16 DIAGNOSIS — I82441 Acute embolism and thrombosis of right tibial vein: Secondary | ICD-10-CM | POA: Diagnosis present

## 2021-05-16 DIAGNOSIS — Z79899 Other long term (current) drug therapy: Secondary | ICD-10-CM | POA: Diagnosis not present

## 2021-05-16 DIAGNOSIS — I82411 Acute embolism and thrombosis of right femoral vein: Secondary | ICD-10-CM | POA: Diagnosis present

## 2021-05-16 LAB — TROPONIN I (HIGH SENSITIVITY)
Troponin I (High Sensitivity): 373 ng/L (ref <18)
Troponin I (High Sensitivity): 285 ng/L (ref <18)

## 2021-05-16 LAB — BASIC METABOLIC PANEL
Anion gap: 12 (ref 5–15)
BUN: 9 mg/dL (ref 8–23)
CO2: 26 mmol/L (ref 22–32)
Calcium: 8.9 mg/dL (ref 8.9–10.3)
Chloride: 96 mmol/L — ABNORMAL LOW (ref 98–111)
Creatinine, Ser: 0.63 mg/dL (ref 0.61–1.24)
GFR, Estimated: >60 mL/min (ref >60)
Glucose, Bld: 156 mg/dL — ABNORMAL HIGH (ref 70–99)
Potassium: 4.7 mmol/L (ref 3.5–5.1)
Sodium: 134 mmol/L — ABNORMAL LOW (ref 135–145)

## 2021-05-16 LAB — ECHOCARDIOGRAM COMPLETE BUBBLE STUDY
AR max vel: 3.18 cm2
AV Area VTI: 2.84 cm2
AV Area mean vel: 2.99 cm2
AV Mean grad: 2 mmHg
AV Peak grad: 4.4 mmHg
Ao pk vel: 1.05 m/s
Area-P 1/2: 3.85 cm2
S' Lateral: 2.6 cm

## 2021-05-16 LAB — MRSA NEXT GEN BY PCR, NASAL: MRSA by PCR Next Gen: NOT DETECTED

## 2021-05-16 LAB — PHOSPHORUS: Phosphorus: 3 mg/dL (ref 2.5–4.6)

## 2021-05-16 LAB — MAGNESIUM: Magnesium: 2 mg/dL (ref 1.7–2.4)

## 2021-05-16 LAB — GLUCOSE, CAPILLARY
Glucose-Capillary: 129 mg/dL — ABNORMAL HIGH (ref 70–99)
Glucose-Capillary: 158 mg/dL — ABNORMAL HIGH (ref 70–99)

## 2021-05-16 LAB — LACTIC ACID, PLASMA: Lactic Acid, Venous: 1.1 mmol/L (ref 0.5–1.9)

## 2021-05-16 MED ORDER — IPRATROPIUM-ALBUTEROL 0.5-2.5 (3) MG/3ML IN SOLN: 3.0000 mL | RESPIRATORY_TRACT | Status: DC | PRN

## 2021-05-16 MED ORDER — ENOXAPARIN SODIUM 60 MG/0.6ML IJ SOSY
1.0000 mg/kg | PREFILLED_SYRINGE | Freq: Two times a day (BID) | INTRAMUSCULAR | Status: DC
  Administered 2021-05-16 – 2021-05-18 (×6): 60 mg via SUBCUTANEOUS
  Filled 2021-05-16 (×6): qty 0.6

## 2021-05-16 MED ORDER — UMECLIDINIUM BROMIDE 62.5 MCG/INH IN AEPB
1.0000 | INHALATION_SPRAY | Freq: Every day | RESPIRATORY_TRACT | Status: DC
  Administered 2021-05-16 – 2021-05-19 (×4): 1 via RESPIRATORY_TRACT
  Filled 2021-05-16: qty 7

## 2021-05-16 MED ORDER — PREDNISONE 20 MG PO TABS
40.0000 mg | ORAL_TABLET | Freq: Every day | ORAL | Status: DC
  Administered 2021-05-16: 40 mg via ORAL
  Filled 2021-05-16: qty 2

## 2021-05-16 MED ORDER — HEPARIN (PORCINE) 25000 UT/250ML-% IV SOLN
1000.0000 [IU]/h | INTRAVENOUS | Status: DC
  Administered 2021-05-16: 1000 [IU]/h via INTRAVENOUS

## 2021-05-16 MED ORDER — MELATONIN 3 MG PO TABS
3.0000 mg | ORAL_TABLET | Freq: Every evening | ORAL | Status: DC | PRN
  Administered 2021-05-16: 3 mg via ORAL
  Filled 2021-05-16: qty 1

## 2021-05-16 MED ORDER — CHLORHEXIDINE GLUCONATE CLOTH 2 % EX PADS
6.0000 | MEDICATED_PAD | Freq: Every day | CUTANEOUS | Status: DC
  Administered 2021-05-16 – 2021-05-19 (×3): 6 via TOPICAL

## 2021-05-16 MED ORDER — ORAL CARE MOUTH RINSE
15.0000 mL | Freq: Two times a day (BID) | OROMUCOSAL | Status: DC
  Administered 2021-05-16 – 2021-05-19 (×7): 15 mL via OROMUCOSAL

## 2021-05-16 NOTE — H&P (Addendum)
NAME:  Jorge Rosales, MRN:  [id number], DOB:  Aug 23, 1946, LOS: 0 ADMISSION DATE:  05/16/2021, CONSULTATION DATE:  05/16/2021 REFERRING MD:  Dr. Tamala Julian of the [facility], CHIEF COMPLAINT:  Shortness of breath   History of Present Illness:  Jorge Rosales is a 75 yo man with a history of COPD and prior tobacco use who presented to the [facility] Emergency Department on 6/19 due to a two week history of progressive shortness of breath. The day of presentation Jorge Rosales shortness of breath significantly worsened. Jorge Rosales daughter was visiting and checked Jorge Rosales oxygen saturation and found it was 79 so EMS was called. Jorge Rosales denies fever chills, cough, chest pain, nausea or vomiting.   Jorge Rosales was treated with duo nebs and Solu-Medrol with EMS and was reportedly having oxygen saturations in the 80s when Jorge Rosales was first picked up. In the [facility] ED a chest CTA PE protocol was positive for acute pulmonary embolism with CT evidence of right heart strain consistent with a submassive PE. High sensitivity troponin elevated to 937 -> 1,150. Jorge Rosales was started on a heparin drip and initially required BiPAP to support Jorge Rosales respiratory status. On arrival to Jorge Rosales, Jorge Rosales was satting well on 6L of oxygen and breathing comfortably.   Pertinent  Medical History  COPD and prior tobacco use. Describes a 80 pack year smoking history, quit 5 years ago  Significant Hospital Events: Including procedures, antibiotic start and stop dates in addition to other pertinent events   05/16/2021: Admitted via [facility]. CTA chest PE protocol demonstrated submassive PE with evidence of right heart strain.  Interim History / Subjective:  05/16/2021: On arrival to [facility] ICU shortness of breath significantly improved  Objective   Blood pressure 124/74, pulse 94, temperature 97.7 F (36.5 C), temperature source Oral, resp. rate 16, height 5\' 10"  (1.778 m), weight 60.7 kg, SpO2 96 %.       No intake or output data in the 24 hours ending  05/16/21 0435 Filed Weights   05/16/21 0300  Weight: 60.7 kg    Examination: General: Alert, resting comfortably in bed HENT: EOMI, neck supple Lungs: Clear to ausculation, moving air well bilaterally Cardiovascular: Regular rhythm, normal rate Abdomen: Soft, nontender, nondistended Extremities: Warm, no edema Neuro: Maintains appropriate conversation GU: deferred  Labs/imaging that I havepersonally reviewed  (right click and "Reselect all SmartList Selections" daily)  CTA Chest PE protocol 05/15/2021- Positive for acute PE with CT evidence of right heart strain consistent with submassive PE. Scarring and consolidation in the upper lungs, likely chronic. Emphysematous changes in the lungs   CBC    Component Value Date/Time   WBC 22.7 (H) 05/15/2021 1946   RBC 5.01 05/15/2021 1946   HGB 16.5 05/15/2021 1946   HCT 47.4 05/15/2021 1946   PLT 373 05/15/2021 1946   MCV 94.6 05/15/2021 1946   MCH 32.9 05/15/2021 1946   MCHC 34.8 05/15/2021 1946   RDW 12.6 05/15/2021 1946   LYMPHSABS 0.9 05/15/2021 1946   MONOABS 1.5 (H) 05/15/2021 1946   EOSABS 0.0 05/15/2021 1946   BASOSABS 0.1 05/15/2021 1946   BMP Latest Ref Rng & Units 05/16/2021 05/15/2021  Glucose 70 - 99 mg/dL 156(H) 121(H)  BUN 8 - 23 mg/dL 9 16  Creatinine 0.61 - 1.24 mg/dL 0.63 0.62  Sodium 135 - 145 mmol/L 134(L) 132(L)  Potassium 3.5 - 5.1 mmol/L 4.7 3.8  Chloride 98 - 111 mmol/L 96(L) 97(L)  CO2 22 - 32 mmol/L 26 26  Calcium  8.9 - 10.3 mg/dL 8.9 8.4(L)     Resolved Hospital Problem list   NA  Assessment & Plan:   PULMONARY A:  Submassive pulmonary embolism with evidence of right heart strain on CTA chest PE protocol 05/16/2021. Good oxygen saturation on 6L nasal cannula. Possible super imposed COPD exacerbation, will treat accordingly   P:   Heparin drip IR consulted, spoke to Dr. Vernard Gambles early morning of 05/16/2021, plan is to follow up TTE to determine need for further intervention Wean supplemental  oxygen as tolerated (patient not on home O2) Continue 5 days of steroids for COPD exacerbation (40mg  of prednisone or equivalent solumedrol if patient remains NPO)   CARDIAC  A: Right heart strain on CTA chest PE   -05/15/2021: High-sensitivity troponin 937 -> 1,150   P: Cycle cardiac enzymes Get echocardiogram     NEUROLOGIC A:   Alert, maintains appropriate conversation     P:   Continue to monitor     INFECTIOUS A:   Afebrile without focal infectious symptoms   P:   Continue to monitor    RENAL A:  Creatinine 0.63 on admission     P:  Continue to monitor     GASTROINTESTINAL A:   No acute abnormalities  P:   N.p.o. until need for VIR procedure determined    HEMATOLOGIC    A:  Current hemoglobin also 16.5 g%     P:  - No signs of bleeding, continue to monitor     ENDOCRINE A:   No active issues   P:   Continue to montior   MSK/DERM No active issues  Plan  -Continue to monitor   CODE   Full code  Best Practice (right click and "Reselect all SmartList Selections" daily)   Diet/type: NPO Pain/Anxiety/Delirium protocol Not indicated VAP protocol (if indicated): Not indicated DVT prophylaxis: systemic heparin GI prophylaxis: N/A Glucose control:  not indicated Central venous access:  N/A Arterial line:  N/A Foley:  N/A Mobility:  OOB  PT consulted: N/A Studies pending: Ultrasound Culture data pending:blood Last reviewed culture data:today Antibiotics:not indicated  Antibiotic de-escalation: N/A Stop date: N/A Daily labs: requested Code Status:  full code Last date of multidisciplinary goals of care discussion [N/A] ccm prognosis: stable  Disposition: admit to the intensive care        Labs   CBC: Recent Labs  Lab 05/15/21 1946  WBC 22.7*  NEUTROABS 20.1*  HGB 16.5  HCT 47.4  MCV 94.6  PLT 916    Basic Metabolic Panel: Recent Labs  Lab 05/15/21 1946  NA 132*  K 3.8  CL 97*  CO2 26  GLUCOSE 121*   BUN 16  CREATININE 0.62  CALCIUM 8.4*  MG 1.9   GFR: Estimated Creatinine Clearance: 69.6 mL/min (by C-G formula based on SCr of 0.62 mg/dL). Recent Labs  Lab 05/15/21 1946 05/15/21 2214  PROCALCITON <0.10  --   WBC 22.7*  --   LATICACIDVEN 1.4 2.2*    Liver Function Tests: Recent Labs  Lab 05/15/21 1946  AST 24  ALT 20  ALKPHOS 127*  BILITOT 1.1  PROT 8.0  ALBUMIN 3.5   No results for input(s): LIPASE, AMYLASE in the last 168 hours. No results for input(s): AMMONIA in the last 168 hours.  ABG    Component Value Date/Time   HCO3 28.3 (H) 05/15/2021 1936   O2SAT 65.2 05/15/2021 1936     Coagulation Profile: Recent Labs  Lab 05/15/21 1946  INR 1.1  Cardiac Enzymes: No results for input(s): CKTOTAL, CKMB, CKMBINDEX, TROPONINI in the last 168 hours.  HbA1C: No results found for: HGBA1C  CBG: No results for input(s): GLUCAP in the last 168 hours.  Review of Systems:   Review of systems negative except as per HPI  Past Medical History:  Jorge Rosales,  has a past medical history of Chronic bronchitis ([place]), COPD (chronic obstructive pulmonary disease) ([place]), and History of colon surgery (0867).   Surgical History:   Past Surgical History:  Procedure Laterality Date   BIL hip replacement  2013   BOWEL RESECTION  1967     Social History:   reports that Jorge Rosales has been smoking cigarettes. Jorge Rosales started smoking about 56 years ago. Jorge Rosales has a 49.00 pack-year smoking history. Jorge Rosales does not have any smokeless tobacco history on file. Jorge Rosales reports current alcohol use.   Family History:  Jorge Rosales family history includes Heart disease in Jorge Rosales father; Lung cancer in Jorge Rosales father.   Allergies No Known Allergies   Home Medications  Prior to Admission medications   Medication Sig Start Date End Date Taking? Authorizing Provider  albuterol (PROAIR HFA) 108 (90 BASE) MCG/ACT inhaler 2 puffs every 4 hours as needed only  if your can't catch your breath 11/04/14   Tanda Rockers, MD   budesonide-formoterol [facility]) 160-4.5 MCG/ACT inhaler Take 2 puffs first thing in am and then another 2 puffs about 12 hours later. 02/26/14   Tanda Rockers, MD  carbamazepine (TEGRETOL) 200 MG tablet Take 200 mg by mouth 3 (three) times daily.    [provider]  Megestrol Acetate (MEGACE ORAL PO) Take 1 tablet by mouth daily.    [provider]  PARoxetine (PAXIL-CR) 25 MG 24 hr tablet Take 1 tablet by mouth daily. 10/13/14   [provider]  Respiratory Therapy Supplies (FLUTTER) DEVI Use as directed 01/27/15   Tanda Rockers, MD  tiotropium (SPIRIVA HANDIHALER) 18 MCG inhalation capsule Place 18 mcg into inhaler and inhale daily.    [provider]     Critical care time: 45 minutes

## 2021-05-16 NOTE — Progress Notes (Signed)
eLink Physician-Brief Progress Note Patient Name: Jorge Rosales DOB: 1946-07-24 MRN: [id number]   Date of Service  05/16/2021  HPI/Events of Note  Received request for sleep aid  eICU Interventions  Melatonin prn ordered     Intervention Category Minor Interventions: Routine modifications to care plan (e.g. PRN medications for pain, fever)  Shona Needles Ofilia Rayon 05/16/2021, 9:28 PM

## 2021-05-16 NOTE — Progress Notes (Signed)
eLink Physician-Brief Progress Note Patient Name: Jorge Rosales DOB: 01/23/1946 MRN: [id number]   Date of Service  05/16/2021  HPI/Events of Note  79 M history of COPD FEV1 1.04 L (33%), presented at [facility] ED with acute worsening of SOB and hypoxia, given bronchodilators and solumedrol with some improvement. Was placed on BiPap for respiratory distress. CXR and subsequent CT revealed multiple PE with evidence of right heart strain. Started on heparin drip. Transferred to [facility] for possible IR intervention which has since been deferred as with significant clinical improvement. Now just on [place].  eICU Interventions  Submassive PE on heparin drip COPD continued on systemic steroids and bronchodilators     Intervention Category Evaluation Type: New Patient Evaluation  Judd Lien 05/16/2021, 3:46 AM

## 2021-05-16 NOTE — Consult Note (Signed)
[place] for Heparin Indication: PE  No Known Allergies  Patient Measurements: Height: 5\' 10"  (177.8 cm) Weight: 60.7 kg (133 lb 13.1 oz) IBW/kg (Calculated) : 73 Heparin Dosing Weight: 63.5 kg  Vital Signs: Temp: 97.7 F (36.5 C) (06/19 0300) Temp Source: Oral (06/19 0300) BP: 124/74 (06/19 0300) Pulse Rate: 94 (06/19 0300)  Labs: Recent Labs    05/15/21 1946 05/15/21 2214  HGB 16.5  --   HCT 47.4  --   PLT 373  --   APTT 38*  --   LABPROT 13.8  --   INR 1.1  --   CREATININE 0.62  --   TROPONINIHS 937* 1,150*     Estimated Creatinine Clearance: 69.6 mL/min (by C-G formula based on SCr of 0.62 mg/dL).  Assessment: 75 y.o. male with PE for heparin.  Heparin initially started at [facility] for chest pain/ACS at 750 units/hr  Goal of Therapy:  Heparin level 0.3-0.7 units/ml Monitor platelets by anticoagulation protocol: Yes   Plan:  Increase Heparin 1000 units/hr Check heparin level in 6 hours.   Caryl Pina 05/16/2021,3:31 AM

## 2021-05-16 NOTE — ED Notes (Signed)
Accepted to [facility]. Coordinator Ruby. Call [phone] for report. [facility] to transport when truck is available.

## 2021-05-16 NOTE — Progress Notes (Signed)
Patient noted to be off bipap and on [place] in no distress. Patient resting with eyes closed. Bipap on standby. Will continue to monitor.

## 2021-05-16 NOTE — Progress Notes (Signed)
Chief Complaint: Patient was seen in consultation today for PE lysis   Referring Physician(s): Dr. Haywood Lasso  Supervising Physician: Arne Cleveland  Patient Status: [facility] - In-pt  History of Present Illness: Jorge Rosales is a 75 y.o. male with hx of COPD presented with 2 weeks of SOB. Found to have sub-massive bilateral PE with evidence of right heart strain by CT criteria. Has been admitted and started on IV heparin. IR was called to consider candidacy for catheter directed thrombolysis. Echo has been ordered, not done yet. Pt reports he feels markedly better since admission. Currently denies CP or breathing difficulty. [place] O2 has been reduced to 3-4L, sats maintaining high 90s BP and HR stable. PMHx, meds, labs, imaging, allergies reviewed.    Past Medical History:  Diagnosis Date   Chronic bronchitis ([place])    COPD (chronic obstructive pulmonary disease) ([place])    History of colon surgery 1967    Past Surgical History:  Procedure Laterality Date   BIL hip replacement  2013   BOWEL RESECTION  1967    Allergies: Patient has no known allergies.  Medications:  Current Facility-Administered Medications:    Chlorhexidine Gluconate Cloth 2 % PADS 6 each, 6 each, Topical, Q0600, Lafayette Dragon, MD, 6 each at 05/16/21 0300   enoxaparin (LOVENOX) injection 60 mg, 1 mg/kg, Subcutaneous, Q12H, [place], Bonna Gains, MD   ipratropium-albuterol (DUONEB) 0.5-2.5 (3) MG/3ML nebulizer solution 3 mL, 3 mL, Nebulization, Q4H PRN, Genevive Bi, Shona Needles, MD   MEDLINE mouth rinse, 15 mL, Mouth Rinse, BID, Lafayette Dragon, MD   predniSONE (DELTASONE) tablet 40 mg, 40 mg, Oral, Q lunch, Kovacich, Amanda, MD   umeclidinium bromide (INCRUSE ELLIPTA) 62.5 MCG/INH 1 puff, 1 puff, Inhalation, Daily, [place], Shona Needles, MD, 1 puff at 05/16/21 0740    Family History  Problem Relation Age of Onset   Heart disease Father    Lung cancer Father     Social History   Socioeconomic History    Marital status: Unknown    Spouse name: Not on file   Number of children: Not on file   Years of education: Not on file   Highest education level: Not on file  Occupational History   Occupation: [employer]    Comment: part time  Tobacco Use   Smoking status: Every Day    Packs/day: 1.00    Years: 49.00    Pack years: 49.00    Types: Cigarettes    Start date: 11/28/1964   Smokeless tobacco: Not on file  Substance and Sexual Activity   Alcohol use: Yes    Comment: 2 beers/day   Drug use: Not on file   Sexual activity: Not on file  Other Topics Concern   Not on file  Social History Narrative   Not on file   Social Determinants of Health   Financial Resource Strain: Not on file  Food Insecurity: Not on file  Transportation Needs: Not on file  Physical Activity: Not on file  Stress: Not on file  Social Connections: Not on file    Review of Systems: A 12 point ROS discussed and pertinent positives are indicated in the HPI above.  All other systems are negative.  Review of Systems  Vital Signs: BP 113/88   Pulse 78   Temp (!) 97.5 F (36.4 C) (Axillary)   Resp 18   Ht 5\' 10"  (1.778 m)   Wt 60.7 kg   SpO2 98%   BMI 19.20 kg/m  Physical Exam Constitutional:      Appearance: Normal appearance. He is not ill-appearing.  Cardiovascular:     Rate and Rhythm: Normal rate and regular rhythm.     Heart sounds: Normal heart sounds.  Pulmonary:     Effort: Pulmonary effort is normal. No respiratory distress.     Breath sounds: Normal breath sounds.  Skin:    General: Skin is warm and dry.  Neurological:     General: No focal deficit present.     Mental Status: He is alert and oriented to person, place, and time.  Psychiatric:        Mood and Affect: Mood normal.        Thought Content: Thought content normal.        Judgment: Judgment normal.      Imaging: CT Angio Chest PE W and/or Wo Contrast  Result Date: 05/15/2021 CLINICAL DATA:  Pulmonary  embolus suspected with high probability. EXAM: CT ANGIOGRAPHY CHEST WITH CONTRAST TECHNIQUE: Multidetector CT imaging of the chest was performed using the standard protocol during bolus administration of intravenous contrast. Multiplanar CT image reconstructions and MIPs were obtained to evaluate the vascular anatomy. CONTRAST:  49mL OMNIPAQUE IOHEXOL 350 MG/ML SOLN COMPARISON:  Chest radiograph 05/15/2021 FINDINGS: Cardiovascular: There is good opacification of the central and segmental pulmonary arteries. Filling defects are demonstrated in the distal right lobar artery, extending into upper and lower lobe segmental branches. Filling defects are also demonstrated in left upper lung segmental and subsegmental branches. Changes are consistent with acute pulmonary embolus. RV to LV ratio is 1.2. No pericardial effusions. Normal caliber thoracic aorta. No evidence of aortic dissection. Mild coronary artery and aortic calcification. Mediastinum/Nodes: Esophagus is decompressed. Filling defect in the trachea likely representing mucoid material. No significant lymphadenopathy. Calcified lymph nodes are present in the mediastinum and left hilum. Lungs/Pleura: Emphysematous changes in the lungs. Linear scarring and consolidation in the left upper lung. Focal pleural thickening and scarring in the right upper lung. Scattered calcified granulomas. No pleural effusions. Upper Abdomen: No acute abnormalities demonstrated in the visualized upper abdomen. Musculoskeletal: Degenerative changes throughout the spine. No destructive bone lesions. Review of the MIP images confirms the above findings. IMPRESSION: 1. Positive for acute PE with CT evidence of right heart strain (RV/LV Ratio = 1.2) consistent with at least submassive (intermediate risk) PE. The presence of right heart strain has been associated with an increased risk of morbidity and mortality. Please refer to the "PE Focused" order set in EPIC. 2. Scarring and  consolidation in the upper lungs, greater on the left. This is likely chronic process, possibly postinflammatory. 3. Emphysematous changes in the lungs. 4. Aortic atherosclerosis. Critical Value/emergent results were called by telephone at the time of interpretation on 05/15/2021 at 9:53 pm to provider [facility] , who verbally acknowledged these results. Electronically Signed   By: Lucienne Capers M.D.   On: 05/15/2021 21:56   DG Chest Portable 1 View  Result Date: 05/15/2021 CLINICAL DATA:  Shortness of breath for 2 weeks.  COPD history. EXAM: PORTABLE CHEST 1 VIEW COMPARISON:  Most recent imaging radiograph 01/27/2015 FINDINGS: Patient is rotated. Heart is normal in size. Questionable left hilar retraction. An area of previous left upper lobe opacity with air-fluid levels no demonstrates streaky densities with some architectural distortion, favoring scarring but nonspecific. Lungs are hyperinflated with bronchial thickening. No pneumothorax or large pleural effusion. No acute osseous abnormalities are seen. IMPRESSION: 1. Area of previous left upper lobe opacity with air-fluid levels  demonstrates streaky densities with architectural distortion, nonspecific. Findings favor scarring, however given lack of interval exams to demonstrate stability, unless performed elsewhere, chest CT (with IV contrast for hilar assessment) is recommended for further assessment. This could be performed on an elective basis. 2. Hyperinflation and bronchial thickening, imaging findings consistent with COPD. Electronically Signed   By: Keith Rake M.D.   On: 05/15/2021 19:58    Labs:  CBC: Recent Labs    05/15/21 1946  WBC 22.7*  HGB 16.5  HCT 47.4  PLT 373    COAGS: Recent Labs    05/15/21 1946  INR 1.1  APTT 38*    BMP: Recent Labs    05/15/21 1946 05/16/21 0348  NA 132* 134*  K 3.8 4.7  CL 97* 96*  CO2 26 26  GLUCOSE 121* 156*  BUN 16 9  CALCIUM 8.4* 8.9  CREATININE 0.62 0.63  GFRNONAA  >60 >60    LIVER FUNCTION TESTS: Recent Labs    05/15/21 1946  BILITOT 1.1  AST 24  ALT 20  ALKPHOS 127*  PROT 8.0  ALBUMIN 3.5    TUMOR MARKERS: No results for input(s): AFPTM, CEA, CA199, CHROMGRNA in the last 8760 hours.  Assessment and Plan: Submassive bilateral PE with right heart strain by CT criteria Positive troponin leak. Echo pending. Discussed indication and role for catheter directed thrombolysis to reduce clot burden. Explained procedure as well as risks, primarily bleeding. Pt states he was feeling so much better, he did not want to proceed with any aggressive procedures. Wishes to continue medical mgmt with heparin as recommended by his primary team. Explained that lysis can still be considered if he has a setback or becomes unstable. Will defer for now.  Thank you for this interesting consult.  I greatly enjoyed meeting Jorge Rosales and look forward to participating in their care.  A copy of this report was sent to the requesting provider on this date.  Electronically Signed: Ascencion Dike, PA-C 05/16/2021, 8:56 AM   I spent a total of 15 minutes in face to face in clinical consultation, greater than 50% of which was counseling/coordinating care for PE lysis

## 2021-05-16 NOTE — Progress Notes (Signed)
Bedside MD aware of elev lactic, aware of Dgn, no need for repeat of lactic at this time

## 2021-05-16 NOTE — Progress Notes (Signed)
  Echocardiogram 2D Echocardiogram has been performed.  Jorge Rosales 05/16/2021, 12:20 PM

## 2021-05-17 ENCOUNTER — Inpatient Hospital Stay (HOSPITAL_COMMUNITY): Payer: Medicare Other

## 2021-05-17 DIAGNOSIS — I2699 Other pulmonary embolism without acute cor pulmonale: Secondary | ICD-10-CM

## 2021-05-17 LAB — BASIC METABOLIC PANEL
Anion gap: 7 (ref 5–15)
BUN: 9 mg/dL (ref 8–23)
CO2: 28 mmol/L (ref 22–32)
Calcium: 8.5 mg/dL — ABNORMAL LOW (ref 8.9–10.3)
Chloride: 101 mmol/L (ref 98–111)
Creatinine, Ser: 0.7 mg/dL (ref 0.61–1.24)
GFR, Estimated: >60 mL/min (ref >60)
Glucose, Bld: 122 mg/dL — ABNORMAL HIGH (ref 70–99)
Potassium: 4.3 mmol/L (ref 3.5–5.1)
Sodium: 136 mmol/L (ref 135–145)

## 2021-05-17 LAB — CBC
HCT: 40.6 % (ref 39.0–52.0)
Hemoglobin: 13.4 g/dL (ref 13.0–17.0)
MCH: 32.7 pg (ref 26.0–34.0)
MCHC: 33 g/dL (ref 30.0–36.0)
MCV: 99 fL (ref 80.0–100.0)
Platelets: 324 10*3/uL (ref 150–400)
RBC: 4.1 MIL/uL — ABNORMAL LOW (ref 4.22–5.81)
RDW: 12.9 % (ref 11.5–15.5)
WBC: 13.7 10*3/uL — ABNORMAL HIGH (ref 4.0–10.5)
nRBC: 0 % (ref 0.0–0.2)

## 2021-05-17 LAB — MAGNESIUM: Magnesium: 2.1 mg/dL (ref 1.7–2.4)

## 2021-05-17 LAB — PHOSPHORUS: Phosphorus: 3.6 mg/dL (ref 2.5–4.6)

## 2021-05-17 MED ORDER — MOMETASONE FURO-FORMOTEROL FUM 200-5 MCG/ACT IN AERO
2.0000 | INHALATION_SPRAY | Freq: Two times a day (BID) | RESPIRATORY_TRACT | Status: DC
  Administered 2021-05-17 – 2021-05-19 (×5): 2 via RESPIRATORY_TRACT
  Filled 2021-05-17: qty 8.8

## 2021-05-17 MED ORDER — TIOTROPIUM BROMIDE MONOHYDRATE 18 MCG IN CAPS: 18.0000 ug | ORAL_CAPSULE | Freq: Every day | RESPIRATORY_TRACT | Status: DC

## 2021-05-17 MED ORDER — CARBAMAZEPINE 200 MG PO TABS
200.0000 mg | ORAL_TABLET | Freq: Three times a day (TID) | ORAL | Status: DC
  Administered 2021-05-17 – 2021-05-19 (×6): 200 mg via ORAL
  Filled 2021-05-17 (×11): qty 1

## 2021-05-17 MED ORDER — WARFARIN SODIUM 7.5 MG PO TABS
7.5000 mg | ORAL_TABLET | Freq: Once | ORAL | Status: AC
  Administered 2021-05-17: 7.5 mg via ORAL
  Filled 2021-05-17 (×2): qty 1

## 2021-05-17 MED ORDER — PATIENT'S GUIDE TO USING COUMADIN BOOK
Freq: Once | Status: AC
  Filled 2021-05-17 (×2): qty 1

## 2021-05-17 MED ORDER — PAROXETINE HCL ER 25 MG PO TB24: 25.0000 mg | ORAL_TABLET | Freq: Every day | ORAL | Status: DC

## 2021-05-17 MED ORDER — WARFARIN - PHARMACIST DOSING INPATIENT: Freq: Every day | Status: DC

## 2021-05-17 MED ORDER — DULOXETINE HCL 20 MG PO CPEP
20.0000 mg | ORAL_CAPSULE | Freq: Every day | ORAL | Status: DC
  Administered 2021-05-17 – 2021-05-19 (×3): 20 mg via ORAL
  Filled 2021-05-17 (×3): qty 1

## 2021-05-17 NOTE — Plan of Care (Signed)

## 2021-05-17 NOTE — Progress Notes (Signed)
ANTICOAGULATION CONSULT NOTE  Pharmacy Consult:  Coumadin  Indication:  Acute PE  No Known Allergies  Patient Measurements: Height: 5\' 10"  (177.8 cm) Weight: 60.7 kg (133 lb 13.1 oz) IBW/kg (Calculated) : 73  Vital Signs: Temp: 97 F (36.1 C) (06/20 0700) Temp Source: Axillary (06/20 0700) BP: 125/67 (06/20 0600) Pulse Rate: 59 (06/20 0600)  Labs: Recent Labs    05/15/21 1946 05/15/21 2214 05/16/21 0348 05/16/21 0601 05/16/21 1129 05/17/21 0250  HGB 16.5  --   --   --   --  13.4  HCT 47.4  --   --   --   --  40.6  PLT 373  --   --   --   --  324  APTT 38*  --   --   --   --   --   LABPROT 13.8  --   --   --   --   --   INR 1.1  --   --   --   --   --   CREATININE 0.62  --  0.63  --   --  0.70  TROPONINIHS 937* 1,150*  --  373* 285*  --     Estimated Creatinine Clearance: 69.6 mL/min (by C-G formula based on SCr of 0.7 mg/dL).  Assessment: 74 YOM presented with SOB, found to have acute PE.  Patient was started on IV heparin and then transitioned to SQ Lovenox.  Given PTA carbamazepine and its interaction with DOAC, Pharmacy consulted to add Coumadin.  Overlap D#1/5.  Renal function and CBC stable, baseline INR 1.1.  No bleeding reported.  Goal of Therapy:  INR 2-3 Anti-Xa level 0.6-1 units/ml 4hrs after LMWH dose given Monitor platelets by anticoagulation protocol: Yes   Plan:  Continue Lovenox 60mg  SQ Q12H Coumadin 7.5mg  PO today Daily PT / INR CBC Q72H while on Lovenox  Marleen Moret D. Mina Marble, PharmD, BCPS, [place] 05/17/2021, 9:31 AM

## 2021-05-17 NOTE — Progress Notes (Signed)
[facility] PROGRESS NOTE    Progress Note  Jorge Rosales  HER:[id number] DOB: 02/23/1946 DOA: 05/16/2021 PCP: [facility]     Brief Narrative:   Jorge Rosales is an 75 y.o. male 75 year old with past medical history of COPD and tobacco abuse who went to the [facility] ED for shortness of breath that started 2 prior to admissions, CTA protocol was positive for PE with right heart strain elevated mild troponins was started on a heparin drip and BiPAP, Transferred to [facility] saturating well on 6 L  Assessment/Plan:   Acute respiratory failure with hypoxia secondary to submassive  Pulmonary embolism ([place]): Started on IV heparin, now lovenox. Will to transition to [place] for home use. 2D echo showed no right heart strain, with an EF of 60% and grade 1 diastolic heart failure no valvular abnormalities.. Continue 5 days of steroids for possible COPD exacerbation. Patient is satting greater 92% on 3 L.  Blood pressure is stable heart rate in the 50s. Transferred to [place].  Leukocytosis: Likely stress margination has remained febrile with blood so is coming down off antibiotics.  Culture data is pending.  COPD without exacerbation: D/c steroids continue home inhalers.  Ambulate and check saturations with ambulation consult physical therapy.  DVT prophylaxis: lovenox Family Communication:Daughter Status is: Inpatient  Remains inpatient appropriate because:Hemodynamically unstable  Dispo: The patient is from: Home              Anticipated d/c is to: Home              Patient currently is not medically stable to d/c.   Difficult to place patient No     Code Status:     Code Status Orders  (From admission, onward)           Start     Ordered   05/16/21 0601  Full code  Continuous        05/16/21 0600           Code Status History     This patient has a current code status but no historical code status.         IV Access:    Peripheral IV   Procedures and diagnostic studies:   CT Angio Chest PE W and/or Wo Contrast  Result Date: 05/15/2021 CLINICAL DATA:  Pulmonary embolus suspected with high probability. EXAM: CT ANGIOGRAPHY CHEST WITH CONTRAST TECHNIQUE: Multidetector CT imaging of the chest was performed using the standard protocol during bolus administration of intravenous contrast. Multiplanar CT image reconstructions and MIPs were obtained to evaluate the vascular anatomy. CONTRAST:  41mL OMNIPAQUE IOHEXOL 350 MG/ML SOLN COMPARISON:  Chest radiograph 05/15/2021 FINDINGS: Cardiovascular: There is good opacification of the central and segmental pulmonary arteries. Filling defects are demonstrated in the distal right lobar artery, extending into upper and lower lobe segmental branches. Filling defects are also demonstrated in left upper lung segmental and subsegmental branches. Changes are consistent with acute pulmonary embolus. RV to LV ratio is 1.2. No pericardial effusions. Normal caliber thoracic aorta. No evidence of aortic dissection. Mild coronary artery and aortic calcification. Mediastinum/Nodes: Esophagus is decompressed. Filling defect in the trachea likely representing mucoid material. No significant lymphadenopathy. Calcified lymph nodes are present in the mediastinum and left hilum. Lungs/Pleura: Emphysematous changes in the lungs. Linear scarring and consolidation in the left upper lung. Focal pleural thickening and scarring in the right upper lung. Scattered calcified granulomas. No pleural effusions. Upper Abdomen: No acute abnormalities demonstrated in the  visualized upper abdomen. Musculoskeletal: Degenerative changes throughout the spine. No destructive bone lesions. Review of the MIP images confirms the above findings. IMPRESSION: 1. Positive for acute PE with CT evidence of right heart strain (RV/LV Ratio = 1.2) consistent with at least submassive (intermediate risk) PE. The presence of right heart  strain has been associated with an increased risk of morbidity and mortality. Please refer to the "PE Focused" order set in EPIC. 2. Scarring and consolidation in the upper lungs, greater on the left. This is likely chronic process, possibly postinflammatory. 3. Emphysematous changes in the lungs. 4. Aortic atherosclerosis. Critical Value/emergent results were called by telephone at the time of interpretation on 05/15/2021 at 9:53 pm to provider [facility] , who verbally acknowledged these results. Electronically Signed   By: Lucienne Capers M.D.   On: 05/15/2021 21:56   DG Chest Portable 1 View  Result Date: 05/15/2021 CLINICAL DATA:  Shortness of breath for 2 weeks.  COPD history. EXAM: PORTABLE CHEST 1 VIEW COMPARISON:  Most recent imaging radiograph 01/27/2015 FINDINGS: Patient is rotated. Heart is normal in size. Questionable left hilar retraction. An area of previous left upper lobe opacity with air-fluid levels no demonstrates streaky densities with some architectural distortion, favoring scarring but nonspecific. Lungs are hyperinflated with bronchial thickening. No pneumothorax or large pleural effusion. No acute osseous abnormalities are seen. IMPRESSION: 1. Area of previous left upper lobe opacity with air-fluid levels demonstrates streaky densities with architectural distortion, nonspecific. Findings favor scarring, however given lack of interval exams to demonstrate stability, unless performed elsewhere, chest CT (with IV contrast for hilar assessment) is recommended for further assessment. This could be performed on an elective basis. 2. Hyperinflation and bronchial thickening, imaging findings consistent with COPD. Electronically Signed   By: Keith Rake M.D.   On: 05/15/2021 19:58   ECHOCARDIOGRAM COMPLETE BUBBLE STUDY  Result Date: 05/16/2021    ECHOCARDIOGRAM REPORT   Patient Name:   Jorge Rosales Date of Exam: 05/16/2021 Medical Rec #:  [id number]    Height:       70.0 in Accession #:     [id number]   Weight:       133.8 lb Date of Birth:  Apr 13, 1946     BSA:          1.760 m Patient Age:    84 years     BP:           118/63 mmHg Patient Gender: M            HR:           78 bpm. Exam Location:  Inpatient Procedure: 2D Echo, Cardiac Doppler and Color Doppler Indications:    Pulmonary embolus  History:        Patient has no prior history of Echocardiogram examinations.                 COPD; Risk Factors:Former Smoker.  Sonographer:    Cammy Brochure Referring Phys: [place]  1. Left ventricular ejection fraction, by estimation, is 60 to 65%. The left ventricle has normal function. Left ventricular endocardial border not optimally defined to evaluate regional wall motion. Left ventricular diastolic parameters are consistent with Grade I diastolic dysfunction (impaired relaxation).  2. Right ventricular systolic function is normal. The right ventricular size is normal.  3. The mitral valve is normal in structure. No evidence of mitral valve regurgitation. No evidence of mitral stenosis.  4. The aortic valve was not well  visualized. There is mild calcification of the aortic valve. There is mild thickening of the aortic valve. Aortic valve regurgitation is not visualized. No aortic stenosis is present.  5. The inferior vena cava is normal in size with greater than 50% respiratory variability, suggesting right atrial pressure of 3 mmHg. FINDINGS  Left Ventricle: Left ventricular ejection fraction, by estimation, is 60 to 65%. The left ventricle has normal function. Left ventricular endocardial border not optimally defined to evaluate regional wall motion. The left ventricular internal cavity size was normal in size. There is no left ventricular hypertrophy. Left ventricular diastolic parameters are consistent with Grade I diastolic dysfunction (impaired relaxation). Normal left ventricular filling pressure. Right Ventricle: The right ventricular size is normal. No increase  in right ventricular wall thickness. Right ventricular systolic function is normal. Left Atrium: Left atrial size was normal in size. Right Atrium: Right atrial size was normal in size. Pericardium: There is no evidence of pericardial effusion. Mitral Valve: The mitral valve is normal in structure. No evidence of mitral valve regurgitation. No evidence of mitral valve stenosis. Tricuspid Valve: The tricuspid valve is normal in structure. Tricuspid valve regurgitation is trivial. No evidence of tricuspid stenosis. Aortic Valve: The aortic valve was not well visualized. There is mild calcification of the aortic valve. There is mild thickening of the aortic valve. There is mild aortic valve annular calcification. Aortic valve regurgitation is not visualized. No aortic stenosis is present. Aortic valve mean gradient measures 2.0 mmHg. Aortic valve peak gradient measures 4.4 mmHg. Aortic valve area, by VTI measures 2.84 cm. Pulmonic Valve: The pulmonic valve was not well visualized. Pulmonic valve regurgitation is not visualized. No evidence of pulmonic stenosis. Aorta: The aortic root is normal in size and structure. Pulmonary Artery: Indeterminant PASP, inadequate TR jet. Venous: The inferior vena cava is normal in size with greater than 50% respiratory variability, suggesting right atrial pressure of 3 mmHg. IAS/Shunts: No atrial level shunt detected by color flow Doppler.  LEFT VENTRICLE PLAX 2D LVIDd:         4.20 cm  Diastology LVIDs:         2.60 cm  LV e' medial:    7.72 cm/s LV PW:         0.80 cm  LV E/e' medial:  9.5 LV IVS:        1.00 cm  LV e' lateral:   8.38 cm/s LVOT diam:     2.10 cm  LV E/e' lateral: 8.7 LV SV:         61 LV SV Index:   35 LVOT Area:     3.46 cm  RIGHT VENTRICLE             IVC RV Basal diam:  4.70 cm     IVC diam: 1.20 cm RV S prime:     12.20 cm/s TAPSE (M-mode): 1.7 cm LEFT ATRIUM             Index       RIGHT ATRIUM           Index LA diam:        2.20 cm 1.25 cm/m  RA Area:      11.30 cm LA Vol (A2C):   28.6 ml 16.25 ml/m RA Volume:   25.60 ml  14.55 ml/m LA Vol (A4C):   28.9 ml 16.42 ml/m LA Biplane Vol: 30.4 ml 17.28 ml/m  AORTIC VALVE AV Area (Vmax):    3.18 cm AV Area (Vmean):  2.99 cm AV Area (VTI):     2.84 cm AV Vmax:           105.00 cm/s AV Vmean:          72.100 cm/s AV VTI:            0.215 m AV Peak Grad:      4.4 mmHg AV Mean Grad:      2.0 mmHg LVOT Vmax:         96.35 cm/s LVOT Vmean:        62.250 cm/s LVOT VTI:          0.176 m LVOT/AV VTI ratio: 0.82  AORTA Ao Root diam: 3.30 cm Ao Asc diam:  3.30 cm MITRAL VALVE MV Area (PHT): 3.85 cm    SHUNTS MV Decel Time: 197 msec    Systemic VTI:  0.18 m MV E velocity: 73.10 cm/s  Systemic Diam: 2.10 cm MV A velocity: 96.70 cm/s MV E/A ratio:  0.76 Carlyle Dolly MD Electronically signed by Carlyle Dolly MD Signature Date/Time: 05/16/2021/12:58:33 PM    Final      Medical Consultants:   None.   Subjective:    Jorge Rosales no complains, feels great  Objective:    Vitals:   05/17/21 0335 05/17/21 0400 05/17/21 0500 05/17/21 0600  BP:  (!) 127/55 122/63 125/67  Pulse:  (!) 51 (!) 50 (!) 59  Resp:  (!) 26 20 17   Temp: 98.5 F (36.9 C)     TempSrc: Oral     SpO2:  99% 98% 93%  Weight:      Height:       SpO2: 93 % O2 Flow Rate (L/min): 3 L/min   Intake/Output Summary (Last 24 hours) at 05/17/2021 0609 Last data filed at 05/16/2021 1552 Gross per 24 hour  Intake 630.52 ml  Output 450 ml  Net 180.52 ml   Filed Weights   05/16/21 0300  Weight: 60.7 kg    Exam: General exam: In no acute distress. Respiratory system: Good air movement and clear to auscultation. Cardiovascular system: S1 & S2 heard, RRR. No JVD. Gastrointestinal system: Abdomen is nondistended, soft and nontender.  Extremities: No pedal edema. Skin: No rashes, lesions or ulcers Psychiatry: Judgement and insight appear normal. Mood & affect appropriate.    Data Reviewed:    Labs: Basic Metabolic Panel: Recent  Labs  Lab 05/15/21 1946 05/16/21 0348 05/17/21 0250  NA 132* 134* 136  K 3.8 4.7 4.3  CL 97* 96* 101  CO2 26 26 28   GLUCOSE 121* 156* 122*  BUN 16 9 9   CREATININE 0.62 0.63 0.70  CALCIUM 8.4* 8.9 8.5*  MG 1.9 2.0 2.1  PHOS  --  3.0 3.6   GFR Estimated Creatinine Clearance: 69.6 mL/min (by C-G formula based on SCr of 0.7 mg/dL). Liver Function Tests: Recent Labs  Lab 05/15/21 1946  AST 24  ALT 20  ALKPHOS 127*  BILITOT 1.1  PROT 8.0  ALBUMIN 3.5   No results for input(s): LIPASE, AMYLASE in the last 168 hours. No results for input(s): AMMONIA in the last 168 hours. Coagulation profile Recent Labs  Lab 05/15/21 1946  INR 1.1   COVID-19 Labs  Recent Labs    05/15/21 1946  DDIMER 2.76*    Lab Results  Component Value Date   SARSCOV2NAA NEGATIVE 05/15/2021    CBC: Recent Labs  Lab 05/15/21 1946 05/17/21 0250  WBC 22.7* 13.7*  NEUTROABS 20.1*  --   HGB 16.5  13.4  HCT 47.4 40.6  MCV 94.6 99.0  PLT 373 324   Cardiac Enzymes: No results for input(s): CKTOTAL, CKMB, CKMBINDEX, TROPONINI in the last 168 hours. BNP (last 3 results) No results for input(s): PROBNP in the last 8760 hours. CBG: Recent Labs  Lab 05/16/21 0720 05/16/21 1926  GLUCAP 129* 158*   D-Dimer: Recent Labs    05/15/21 1946  DDIMER 2.76*   Hgb A1c: No results for input(s): HGBA1C in the last 72 hours. Lipid Profile: No results for input(s): CHOL, HDL, LDLCALC, TRIG, CHOLHDL, LDLDIRECT in the last 72 hours. Thyroid function studies: No results for input(s): TSH, T4TOTAL, T3FREE, THYROIDAB in the last 72 hours.  Invalid input(s): FREET3 Anemia work up: No results for input(s): VITAMINB12, FOLATE, FERRITIN, TIBC, IRON, RETICCTPCT in the last 72 hours. Sepsis Labs: Recent Labs  Lab 05/15/21 1946 05/15/21 2214 05/16/21 0601 05/17/21 0250  PROCALCITON <0.10  --   --   --   WBC 22.7*  --   --  13.7*  LATICACIDVEN 1.4 2.2* 1.1  --    Microbiology Recent Results (from  the past 240 hour(s))  Resp Panel by RT-PCR (Flu A&B, Covid) Nasopharyngeal Swab     Status: None   Collection Time: 05/15/21  7:46 PM   Specimen: Nasopharyngeal Swab; Nasopharyngeal(NP) swabs in vial transport medium  Result Value Ref Range Status   SARS Coronavirus 2 by RT PCR NEGATIVE NEGATIVE Final    Comment: (NOTE) SARS-CoV-2 target nucleic acids are NOT DETECTED.  The SARS-CoV-2 RNA is generally detectable in upper respiratory specimens during the acute phase of infection. The lowest concentration of SARS-CoV-2 viral copies this assay can detect is 138 copies/mL. A negative result does not preclude SARS-Cov-2 infection and should not be used as the sole basis for treatment or other patient management decisions. A negative result may occur with  improper specimen collection/handling, submission of specimen other than nasopharyngeal swab, presence of viral mutation(s) within the areas targeted by this assay, and inadequate number of viral copies(<138 copies/mL). A negative result must be combined with clinical observations, patient history, and epidemiological information. The expected result is Negative.  Fact Sheet for Patients:  EntrepreneurPulse.com.au  Fact Sheet for Healthcare Providers:  IncredibleEmployment.be  This test is no t yet approved or cleared by the [place] FDA and  has been authorized for detection and/or diagnosis of SARS-CoV-2 by FDA under an Emergency Use Authorization (EUA). This EUA will remain  in effect (meaning this test can be used) for the duration of the COVID-19 declaration under Section 564(b)(1) of the Act, 21 U.S.C.section 360bbb-3(b)(1), unless the authorization is terminated  or revoked sooner.       Influenza A by PCR NEGATIVE NEGATIVE Final   Influenza B by PCR NEGATIVE NEGATIVE Final    Comment: (NOTE) The Xpert Xpress SARS-CoV-2/FLU/RSV plus assay is intended as an aid in the diagnosis of  influenza from Nasopharyngeal swab specimens and should not be used as a sole basis for treatment. Nasal washings and aspirates are unacceptable for Xpert Xpress SARS-CoV-2/FLU/RSV testing.  Fact Sheet for Patients: EntrepreneurPulse.com.au  Fact Sheet for Healthcare Providers: IncredibleEmployment.be  This test is not yet approved or cleared by the [place] FDA and has been authorized for detection and/or diagnosis of SARS-CoV-2 by FDA under an Emergency Use Authorization (EUA). This EUA will remain in effect (meaning this test can be used) for the duration of the COVID-19 declaration under Section 564(b)(1) of the Act, 21 U.S.C. section 360bbb-3(b)(1), unless the  authorization is terminated or revoked.  Performed at [facility], [address] [place], [place] [address]   Blood culture (routine single)     Status: None (Preliminary result)   Collection Time: 05/15/21  8:44 PM   Specimen: BLOOD  Result Value Ref Range Status   Specimen Description BLOOD RIGHT ANTECUBITAL  Final   Special Requests   Final    BOTTLES DRAWN AEROBIC AND ANAEROBIC Blood Culture adequate volume   Culture   Final    NO GROWTH < 12 HOURS Performed at [facility], [address] [place], [place] [address]    Report Status PENDING  Incomplete  MRSA Next Gen by PCR, Nasal     Status: None   Collection Time: 05/16/21  4:11 AM   Specimen: Nasal Mucosa; Nasal Swab  Result Value Ref Range Status   MRSA by PCR Next Gen NOT DETECTED NOT DETECTED Final    Comment: (NOTE) The GeneXpert MRSA Assay (FDA approved for NASAL specimens only), is one component of a comprehensive MRSA colonization surveillance program. It is not intended to diagnose MRSA infection nor to guide or monitor treatment for MRSA infections. Test performance is not FDA approved in patients less than 32 years old. Performed at [facility], [place] [address],  [place], [place] [address]      Medications:    Chlorhexidine Gluconate Cloth  6 each Topical Q0600   enoxaparin (LOVENOX) injection  1 mg/kg Subcutaneous Q12H   mouth rinse  15 mL Mouth Rinse BID   predniSONE  40 mg Oral Q lunch   umeclidinium bromide  1 puff Inhalation Daily   Continuous Infusions:    LOS: 1 day   Charlynne Cousins  [facility]  05/17/2021, 6:09 AM

## 2021-05-17 NOTE — Progress Notes (Signed)
VASCULAR LAB    Bilateral lower extremity venous duplex has been performed.  See CV proc for preliminary results.  Gave verbal report to [employer], RN  Kymberlyn Eckford, RVT 05/17/2021, 12:22 PM

## 2021-05-18 DIAGNOSIS — I2699 Other pulmonary embolism without acute cor pulmonale: Secondary | ICD-10-CM | POA: Diagnosis not present

## 2021-05-18 LAB — PROTIME-INR
INR: 1.1 (ref 0.8–1.2)
Prothrombin Time: 14.6 seconds (ref 11.4–15.2)

## 2021-05-18 MED ORDER — POLYETHYLENE GLYCOL 3350 17 GM/SCOOP PO POWD
1.0000 | Freq: Once | ORAL | Status: DC
  Filled 2021-05-18: qty 255

## 2021-05-18 MED ORDER — POLYETHYLENE GLYCOL 3350 17 G PO PACK
17.0000 g | PACK | Freq: Every day | ORAL | Status: DC | PRN
  Administered 2021-05-18: 17 g via ORAL

## 2021-05-18 MED ORDER — WARFARIN SODIUM 7.5 MG PO TABS
7.5000 mg | ORAL_TABLET | Freq: Once | ORAL | Status: AC
  Administered 2021-05-18: 7.5 mg via ORAL
  Filled 2021-05-18: qty 1

## 2021-05-18 NOTE — Evaluation (Signed)
Occupational Therapy Evaluation Patient Details Name: Jorge Rosales MRN: [id number] DOB: May 27, 1946 Today's Date: 05/18/2021    History of Present Illness 75 y.o. male presenting to [facility] ED 6/19 with progressive SOB x2 weeks. SpO2 79% on RA at home. chest CTA (+) acute PE and CT (+) R heart strain consistent with a submassive PE, started on heparin. PMHx significant for COPD and prior tobacco use.   Clinical Impression   PTA patient was living alone in a 1st floor apartment with level entry and was grossly I with ADLs/IADLs without AD. Patient drives and manages his own medications/finances. Patient reports going to the gym several days a week prior to onset of global pandemic but has since stopped. Time spent educating patient on importance of physical activity to manage chronic conditions including COPD. Patient expressed verbal understanding. Patient currently functioning near baseline demonstrating observed ADLs with I and 4L O2 via [place]. OT to see patient for 1 follow-up treatment session to provide education on energy conservation with ADLs/IADLs and BUE HEP to improve strength.     Follow Up Recommendations  No OT follow up    Equipment Recommendations  None recommended by OT    Recommendations for Other Services       Precautions / Restrictions Precautions Precautions: None Restrictions Weight Bearing Restrictions: No      Mobility Bed Mobility Overal bed mobility: Independent                  Transfers Overall transfer level: Independent                    Balance Overall balance assessment: No apparent balance deficits (not formally assessed)                                         ADL either performed or assessed with clinical judgement   ADL Overall ADL's : Independent                                             Vision Baseline Vision/History: Wears glasses Wears Glasses: At all times Patient Visual Report: No  change from baseline Vision Assessment?: No apparent visual deficits     Perception     Praxis      Pertinent Vitals/Pain Pain Assessment: No/denies pain     Hand Dominance     Extremity/Trunk Assessment Upper Extremity Assessment Upper Extremity Assessment: Generalized weakness   Lower Extremity Assessment Lower Extremity Assessment: Defer to PT evaluation   Cervical / Trunk Assessment Cervical / Trunk Assessment: Normal   Communication Communication Communication: No difficulties   Cognition Arousal/Alertness: Awake/alert Behavior During Therapy: WFL for tasks assessed/performed Overall Cognitive Status: Within Functional Limits for tasks assessed                                     General Comments  SpO2 97% on 4L upon entry. [place] to 80% on 4L via [place] with short-distance mobility in room.    Exercises     Shoulder Instructions      Home Living Family/patient expects to be discharged to:: Private residence Living Arrangements: Alone Available Help at Discharge: Family;Available PRN/intermittently Type of Home: [place]  Access: Level entry     Home Layout: One level     Bathroom Shower/Tub: Teacher, early years/pre: Standard     Home Equipment: Building services engineer Comments: States shower seat does not fit inside of his tub      Prior Functioning/Environment Level of Independence: Independent        Comments: I with ADLs/IADLs without AD; drives; reports ordering takeout more than cooking for convenience        OT Problem List: Decreased strength;Cardiopulmonary status limiting activity      OT Treatment/Interventions: Therapeutic exercise;Therapeutic activities;Energy conservation    OT Goals(Current goals can be found in the care plan section) Acute Rehab OT Goals Patient Stated Goal: To return home. OT Goal Formulation: With patient Time For Goal Achievement: 06/01/21 Potential to Achieve Goals:  Good ADL Goals Additional ADL Goal #1: Patient will return demo BUE HEP with written handout and I. Additional ADL Goal #2: Patient will recall 3 energy cosnervation techniques in prep for ADLs/IADLs.  OT Frequency: Other (comment) (1 follow-up OT session)   Barriers to D/C:            Co-evaluation              AM-PAC OT "6 Clicks" Daily Activity     Outcome Measure Help from another person eating meals?: None Help from another person taking care of personal grooming?: None Help from another person toileting, which includes using toliet, bedpan, or urinal?: None Help from another person bathing (including washing, rinsing, drying)?: None Help from another person to put on and taking off regular upper body clothing?: None Help from another person to put on and taking off regular lower body clothing?: None 6 Click Score: 24   End of Session Equipment Utilized During Treatment: Gait belt Nurse Communication: Mobility status;Other (comment) (Response to treatment)  Activity Tolerance: Patient tolerated treatment well Patient left: in chair;with call bell/phone within reach;with chair alarm set  OT Visit Diagnosis: Muscle weakness (generalized) (M62.81)                Time: 9604-5409 OT Time Calculation (min): 19 min Charges:  OT General Charges $OT Visit: 1 Visit OT Evaluation $OT Eval Low Complexity: 1 Low  Yuleni Burich H. OTR/L Supplemental OT, Department of rehab services [phone]  Akoni Parton R H. 05/18/2021, 9:19 AM

## 2021-05-18 NOTE — Plan of Care (Signed)

## 2021-05-18 NOTE — Progress Notes (Signed)
ANTICOAGULATION CONSULT NOTE - Follow Up Consult  Pharmacy Consult for Coumadin Indication:  DVT and PE  No Known Allergies  Patient Measurements: Height: 5\' 10"  (177.8 cm) Weight: 60.7 kg (133 lb 13.1 oz) IBW/kg (Calculated) : 73  Vital Signs: Temp: 98.3 F (36.8 C) (06/21 0557) Temp Source: Oral (06/21 0557) BP: 124/80 (06/21 0557) Pulse Rate: 67 (06/21 0557)  Labs: Recent Labs    05/15/21 1946 05/15/21 2214 05/16/21 0348 05/16/21 0601 05/16/21 1129 05/17/21 0250 05/18/21 0029  HGB 16.5  --   --   --   --  13.4  --   HCT 47.4  --   --   --   --  40.6  --   PLT 373  --   --   --   --  324  --   APTT 38*  --   --   --   --   --   --   LABPROT 13.8  --   --   --   --   --  14.6  INR 1.1  --   --   --   --   --  1.1  CREATININE 0.62  --  0.63  --   --  0.70  --   TROPONINIHS 937* 1,150*  --  373* 285*  --   --     Estimated Creatinine Clearance: 69.6 mL/min (by C-G formula based on SCr of 0.7 mg/dL).   Assessment: Anticoag: Heparin > Lovenox, add Coumadin 6/20 for acute submassive PE + acute deep vein thrombosis involving the right common femoral vein, and right posterior tibial veins. May possibly need higher warfarin doses due to the hepatic enzyme inducing effect of carbamazepine. - INR 1.1. Hgb 13.4. Plts 324 WNL. CrCl>30  Goal of Therapy:  INR 2-3 Monitor platelets by anticoagulation protocol: Yes   Plan:  Continue Lovenox 60mg  SQ Q12H per MD Coumadin 7.5mg  PO today Daily PT / INR CBC Q72H while on Lovenox   Jodean Valade S. Alford Highland, PharmD, BCPS Clinical Staff Pharmacist [email or web address]  Alford Highland, Roen Macgowan Stillinger 05/18/2021,9:09 AM

## 2021-05-18 NOTE — Progress Notes (Signed)
SATURATION QUALIFICATIONS: (This note is used to comply with regulatory documentation for home oxygen)  Patient Saturations on Room Air at Rest = 92%  Patient Saturations on Room Air while Ambulating = 80%  Patient Saturations on 4 Liters of oxygen while Ambulating = 85%  Patient Saturations on 6 Liters of oxygen while ambulating =88%  Please briefly explain why patient needs home oxygen:Pt requires O2 with activity to maintain saturation >88%.  THanks. Verlene Glantz M,PT Acute Rehab Services [phone] (pager)

## 2021-05-18 NOTE — Progress Notes (Signed)
Per MD patient will go home on Lovenox x 5 days. Provided patient education on Lovenox injections. Patient stated he wanted to watch me give the first injection and would do himself this afternoon.

## 2021-05-18 NOTE — TOC Initial Note (Addendum)
Transition of Care [facility]) - Initial/Assessment Note    Patient Details  Name: Jorge Rosales MRN: [id number] Date of Birth: 12-18-1945  Transition of Care [facility]) CM/SW Contact:    Joanne Chars, LCSW Phone Number: 05/18/2021, 10:52 AM  Clinical Narrative:  CSW met with pt to discuss discharge recommendations.  Pt lives in [place], PCP is Dr Kym Groom at [facility] in [place], and he has had previous services before in [place], including pulmonary rehab at the [facility] on [address] in [place] with Dr Idamae Schuller.  Daughter Mendel Ryder is primary support, permission given to speak with her.  Pt has had home O2 in the past but not currently, does not want to work with [facility] Gins, the previous [place], again.  Pt has had 4 covid vaccinations/boosters.  Pt is agreeable to further pulmonary rehab and is aware he will need home O2 again.  CSW will continue to follow for discharge needs.         1500: appt scheduled with [facility] for Friday, 6/24, at 1030.            Expected Discharge Plan: Home/Self Care Barriers to Discharge: Continued Medical Work up   Patient Goals and CMS Choice Patient states their goals for this hospitalization and ongoing recovery are:: "moving around better" CMS Medicare.gov Compare Post Acute Care list provided to::  (na)    Expected Discharge Plan and Services Expected Discharge Plan: Home/Self Care In-house Referral: Clinical Social Work   Post Acute Care Choice: Durable Medical Equipment Living arrangements for the past 2 months: Single Family Home                                      Prior Living Arrangements/Services Living arrangements for the past 2 months: Single Family Home Lives with:: Self Patient language and need for interpreter reviewed:: Yes Do you feel safe going back to the place where you live?: Yes      Need for Family Participation in Patient Care: No (Comment) Care giver support system in place?: Yes  (comment) Current home services: Other (comment) (none) Criminal Activity/Legal Involvement Pertinent to Current Situation/Hospitalization: No - Comment as needed  Activities of Daily Living Home Assistive Devices/Equipment: Eyeglasses ADL Screening (condition at time of admission) Patient's cognitive ability adequate to safely complete daily activities?: Yes Is the patient deaf or have difficulty hearing?: No Does the patient have difficulty seeing, even when wearing glasses/contacts?: No Does the patient have difficulty concentrating, remembering, or making decisions?: No Patient able to express need for assistance with ADLs?: Yes Does the patient have difficulty dressing or bathing?: No Independently performs ADLs?: No Communication: Independent Dressing (OT): Independent Grooming: Independent Feeding: Independent Bathing: Independent Toileting: Independent In/Out Bed: Independent Walks in Home: Independent Does the patient have difficulty walking or climbing stairs?: Yes Weakness of Legs: None Weakness of Arms/Hands: None  Permission Sought/Granted Permission sought to share information with : Family Supports Permission granted to share information with : Yes, Verbal Permission Granted  Share Information with NAME: daughter Ria Comment           Emotional Assessment Appearance:: Appears stated age Attitude/Demeanor/Rapport: Engaged Affect (typically observed): Appropriate, Pleasant Orientation: : Oriented to Self, Oriented to Place, Oriented to  Time, Oriented to Situation Alcohol / Substance Use: Not Applicable Psych Involvement: No (comment)  Admission diagnosis:  Pulmonary embolism ([place]) [I26.99] Patient Active Problem List   Diagnosis Date  Noted   Acute pulmonary embolism ([place]) 05/16/2021   Pulmonary embolism ([place]) 05/16/2021   Pulmonary cavitary lesion 04/11/2014   Smoker 01/23/2014   Solitary pulmonary nodule 01/23/2014   COPD GOLD III 01/22/2014   PCP:   [facility]:   [facility] [place], [place] - [address] [place] [address] Phone: [phone] Fax: [phone]  [facility]  ([place], [address], [address] [place], [place] [place] [address] Phone: [phone] Fax: [phone]  [id number] [place] [id number]: [phone]: [phone] [id number] [place] [id number]: [phone]: [id number] [id number]: [phone]: [phone]  Social Determinants of Health (SDOH) Interventions    Readmission Risk Interventions No flowsheet data found.

## 2021-05-18 NOTE — Progress Notes (Signed)
[facility] PROGRESS NOTE    Progress Note  Jorge Rosales  QBH:[id number] DOB: 05-12-1946 DOA: 05/16/2021 PCP: [facility]     Brief Narrative:   Jorge Rosales is an 74 y.o. male 75 year old with past medical history of COPD and tobacco abuse who went to the [facility] ED for shortness of breath that started 2 prior to admissions, CTA protocol was positive for PE with right heart strain elevated mild troponins was started on a heparin drip and BiPAP, Transferred to [facility] saturating well on 6 L  Assessment/Plan:   Acute respiratory failure with hypoxia secondary to submassive  Pulmonary embolism: Started on IV heparin, now lovenox.  Cannot transition to an oral DOAC as he is on carbamazepine and there is a strong interaction. Start him on Coumadin, will asked TOC to set up an appointment at the Coumadin clinic. Discontinue steroids. Patient is satting greater 92% on 3 L.  Blood pressure is stable heart rate in the 50s. Out of bed to chair, consult physical therapy.  Leukocytosis: Likely stress margination and steroids.,  Has remained afebrile discontinue IV antibiotics. Culture data has remained negative till date.   COPD without exacerbation: D/c steroids continue home inhalers.  Ambulate and check saturations with ambulation consult physical therapy.  DVT prophylaxis: lovenox and coumdain Family Communication:Daughter Status is: Inpatient  Remains inpatient appropriate because:Hemodynamically unstable  Dispo: The patient is from: Home              Anticipated d/c is to: Home              Patient currently is not medically stable to d/c.   Difficult to place patient No     Code Status:     Code Status Orders  (From admission, onward)           Start     Ordered   05/16/21 0601  Full code  Continuous        05/16/21 0600           Code Status History     This patient has a current code status but no historical code status.          IV Access:   Peripheral IV   Procedures and diagnostic studies:   ECHOCARDIOGRAM COMPLETE BUBBLE STUDY  Result Date: 05/16/2021    ECHOCARDIOGRAM REPORT   Patient Name:   Jorge Rosales Date of Exam: 05/16/2021 Medical Rec #:  [id number]    Height:       70.0 in Accession #:    [id number]   Weight:       133.8 lb Date of Birth:  Jul 01, 1946     BSA:          1.760 m Patient Age:    26 years     BP:           118/63 mmHg Patient Gender: M            HR:           78 bpm. Exam Location:  Inpatient Procedure: 2D Echo, Cardiac Doppler and Color Doppler Indications:    Pulmonary embolus  History:        Patient has no prior history of Echocardiogram examinations.                 COPD; Risk Factors:Former Smoker.  Sonographer:    Cammy Brochure Referring Phys: [place]  1. Left ventricular ejection fraction, by estimation,  is 60 to 65%. The left ventricle has normal function. Left ventricular endocardial border not optimally defined to evaluate regional wall motion. Left ventricular diastolic parameters are consistent with Grade I diastolic dysfunction (impaired relaxation).  2. Right ventricular systolic function is normal. The right ventricular size is normal.  3. The mitral valve is normal in structure. No evidence of mitral valve regurgitation. No evidence of mitral stenosis.  4. The aortic valve was not well visualized. There is mild calcification of the aortic valve. There is mild thickening of the aortic valve. Aortic valve regurgitation is not visualized. No aortic stenosis is present.  5. The inferior vena cava is normal in size with greater than 50% respiratory variability, suggesting right atrial pressure of 3 mmHg. FINDINGS  Left Ventricle: Left ventricular ejection fraction, by estimation, is 60 to 65%. The left ventricle has normal function. Left ventricular endocardial border not optimally defined to evaluate regional wall motion. The left ventricular internal  cavity size was normal in size. There is no left ventricular hypertrophy. Left ventricular diastolic parameters are consistent with Grade I diastolic dysfunction (impaired relaxation). Normal left ventricular filling pressure. Right Ventricle: The right ventricular size is normal. No increase in right ventricular wall thickness. Right ventricular systolic function is normal. Left Atrium: Left atrial size was normal in size. Right Atrium: Right atrial size was normal in size. Pericardium: There is no evidence of pericardial effusion. Mitral Valve: The mitral valve is normal in structure. No evidence of mitral valve regurgitation. No evidence of mitral valve stenosis. Tricuspid Valve: The tricuspid valve is normal in structure. Tricuspid valve regurgitation is trivial. No evidence of tricuspid stenosis. Aortic Valve: The aortic valve was not well visualized. There is mild calcification of the aortic valve. There is mild thickening of the aortic valve. There is mild aortic valve annular calcification. Aortic valve regurgitation is not visualized. No aortic stenosis is present. Aortic valve mean gradient measures 2.0 mmHg. Aortic valve peak gradient measures 4.4 mmHg. Aortic valve area, by VTI measures 2.84 cm. Pulmonic Valve: The pulmonic valve was not well visualized. Pulmonic valve regurgitation is not visualized. No evidence of pulmonic stenosis. Aorta: The aortic root is normal in size and structure. Pulmonary Artery: Indeterminant PASP, inadequate TR jet. Venous: The inferior vena cava is normal in size with greater than 50% respiratory variability, suggesting right atrial pressure of 3 mmHg. IAS/Shunts: No atrial level shunt detected by color flow Doppler.  LEFT VENTRICLE PLAX 2D LVIDd:         4.20 cm  Diastology LVIDs:         2.60 cm  LV e' medial:    7.72 cm/s LV PW:         0.80 cm  LV E/e' medial:  9.5 LV IVS:        1.00 cm  LV e' lateral:   8.38 cm/s LVOT diam:     2.10 cm  LV E/e' lateral: 8.7 LV SV:          61 LV SV Index:   35 LVOT Area:     3.46 cm  RIGHT VENTRICLE             IVC RV Basal diam:  4.70 cm     IVC diam: 1.20 cm RV S prime:     12.20 cm/s TAPSE (M-mode): 1.7 cm LEFT ATRIUM             Index       RIGHT ATRIUM  Index LA diam:        2.20 cm 1.25 cm/m  RA Area:     11.30 cm LA Vol (A2C):   28.6 ml 16.25 ml/m RA Volume:   25.60 ml  14.55 ml/m LA Vol (A4C):   28.9 ml 16.42 ml/m LA Biplane Vol: 30.4 ml 17.28 ml/m  AORTIC VALVE AV Area (Vmax):    3.18 cm AV Area (Vmean):   2.99 cm AV Area (VTI):     2.84 cm AV Vmax:           105.00 cm/s AV Vmean:          72.100 cm/s AV VTI:            0.215 m AV Peak Grad:      4.4 mmHg AV Mean Grad:      2.0 mmHg LVOT Vmax:         96.35 cm/s LVOT Vmean:        62.250 cm/s LVOT VTI:          0.176 m LVOT/AV VTI ratio: 0.82  AORTA Ao Root diam: 3.30 cm Ao Asc diam:  3.30 cm MITRAL VALVE MV Area (PHT): 3.85 cm    SHUNTS MV Decel Time: 197 msec    Systemic VTI:  0.18 m MV E velocity: 73.10 cm/s  Systemic Diam: 2.10 cm MV A velocity: 96.70 cm/s MV E/A ratio:  0.76 Carlyle Dolly MD Electronically signed by Carlyle Dolly MD Signature Date/Time: 05/16/2021/12:58:33 PM    Final    VAS [place] LOWER EXTREMITY VENOUS (DVT)  Result Date: 05/17/2021  Lower Venous DVT Study Patient Name:  Jorge Rosales  Date of Exam:   05/17/2021 Medical Rec #: [id number]     Accession #:    [id number] Date of Birth: Apr 13, 1946      Patient Gender: M Patient Age:   89Y Exam Location:  [facility] Procedure:      VAS [place] LOWER EXTREMITY VENOUS (DVT) Referring Phys: [id number] Trinka Keshishyan FELIZ ORTIZ --------------------------------------------------------------------------------  Indications: Pulmonary embolism.  Comparison Study: No prior study Performing Technologist: Sharion Dove RVS  Examination Guidelines: A complete evaluation includes B-mode imaging, spectral Doppler, color Doppler, and power Doppler as needed of all accessible portions of each vessel. Bilateral testing is  considered an integral part of a complete examination. Limited examinations for reoccurring indications may be performed as noted. The reflux portion of the exam is performed with the patient in reverse Trendelenburg.  +---------+---------------+---------+-----------+----------+------------------+ RIGHT    CompressibilityPhasicitySpontaneityPropertiesThrombus Aging     +---------+---------------+---------+-----------+----------+------------------+ CFV      Partial        Yes      Yes                  Non occlusive,                                                           Acute              +---------+---------------+---------+-----------+----------+------------------+ SFJ      Full                                                            +---------+---------------+---------+-----------+----------+------------------+  FV Prox  Full                                                            +---------+---------------+---------+-----------+----------+------------------+ FV Mid   Full                                                            +---------+---------------+---------+-----------+----------+------------------+ FV DistalFull                                                            +---------+---------------+---------+-----------+----------+------------------+ PFV      Full                                                            +---------+---------------+---------+-----------+----------+------------------+ POP      Full                                                            +---------+---------------+---------+-----------+----------+------------------+ PTV      None                                         Acute              +---------+---------------+---------+-----------+----------+------------------+ PERO     Full                                                             +---------+---------------+---------+-----------+----------+------------------+ EIV                     Yes      Yes                  patent             +---------+---------------+---------+-----------+----------+------------------+   +---------+---------------+---------+-----------+----------+--------------+ LEFT     CompressibilityPhasicitySpontaneityPropertiesThrombus Aging +---------+---------------+---------+-----------+----------+--------------+ CFV      Full           Yes      Yes                                 +---------+---------------+---------+-----------+----------+--------------+ SFJ      Full                                                        +---------+---------------+---------+-----------+----------+--------------+  FV Prox  Full                                                        +---------+---------------+---------+-----------+----------+--------------+ FV Mid   Full                                                        +---------+---------------+---------+-----------+----------+--------------+ FV DistalFull                                                        +---------+---------------+---------+-----------+----------+--------------+ PFV      Full                                                        +---------+---------------+---------+-----------+----------+--------------+ POP      Full           Yes      Yes                                 +---------+---------------+---------+-----------+----------+--------------+ PTV      Full                                                        +---------+---------------+---------+-----------+----------+--------------+ PERO     Full                                                        +---------+---------------+---------+-----------+----------+--------------+     Summary: RIGHT: - Findings consistent with acute deep vein thrombosis involving the right common femoral  vein, and right posterior tibial veins.  LEFT: - There is no evidence of deep vein thrombosis in the lower extremity.  *See table(s) above for measurements and observations. Electronically signed by Ruta Hinds MD on 05/17/2021 at 6:50:15 PM.    Final      Medical Consultants:   None.   Subjective:    Jorge Rosales feels great tolerating his diet.  Objective:    Vitals:   05/17/21 1822 05/17/21 2055 05/17/21 2107 05/18/21 0557  BP: (!) 141/83  128/66 124/80  Pulse: 87  66 67  Resp: 17  20 19   Temp: 97.7 F (36.5 C)  98.2 F (36.8 C) 98.3 F (36.8 C)  TempSrc:    Oral  SpO2: 98% 97% 100% 100%  Weight:      Height:       SpO2: 100 % O2 Flow Rate (L/min): 3 L/min   Intake/Output Summary (  Last 24 hours) at 05/18/2021 0757 Last data filed at 05/17/2021 2130 Gross per 24 hour  Intake 600 ml  Output 1950 ml  Net -1350 ml    Filed Weights   05/16/21 0300  Weight: 60.7 kg    Exam: General exam: In no acute distress. Respiratory system: Good air movement and clear to auscultation. Cardiovascular system: S1 & S2 heard, RRR. No JVD. Gastrointestinal system: Abdomen is nondistended, soft and nontender.  Extremities: No pedal edema. Skin: No rashes, lesions or ulcers Psychiatry: Judgement and insight appear normal. Mood & affect appropriate.   Data Reviewed:    Labs: Basic Metabolic Panel: Recent Labs  Lab 05/15/21 1946 05/16/21 0348 05/17/21 0250  NA 132* 134* 136  K 3.8 4.7 4.3  CL 97* 96* 101  CO2 26 26 28   GLUCOSE 121* 156* 122*  BUN 16 9 9   CREATININE 0.62 0.63 0.70  CALCIUM 8.4* 8.9 8.5*  MG 1.9 2.0 2.1  PHOS  --  3.0 3.6    GFR Estimated Creatinine Clearance: 69.6 mL/min (by C-G formula based on SCr of 0.7 mg/dL). Liver Function Tests: Recent Labs  Lab 05/15/21 1946  AST 24  ALT 20  ALKPHOS 127*  BILITOT 1.1  PROT 8.0  ALBUMIN 3.5    No results for input(s): LIPASE, AMYLASE in the last 168 hours. No results for input(s): AMMONIA in  the last 168 hours. Coagulation profile Recent Labs  Lab 05/15/21 1946 05/18/21 0029  INR 1.1 1.1    COVID-19 Labs  Recent Labs    05/15/21 1946  DDIMER 2.76*     Lab Results  Component Value Date   SARSCOV2NAA NEGATIVE 05/15/2021    CBC: Recent Labs  Lab 05/15/21 1946 05/17/21 0250  WBC 22.7* 13.7*  NEUTROABS 20.1*  --   HGB 16.5 13.4  HCT 47.4 40.6  MCV 94.6 99.0  PLT 373 324    Cardiac Enzymes: No results for input(s): CKTOTAL, CKMB, CKMBINDEX, TROPONINI in the last 168 hours. BNP (last 3 results) No results for input(s): PROBNP in the last 8760 hours. CBG: Recent Labs  Lab 05/16/21 0720 05/16/21 1926  GLUCAP 129* 158*    D-Dimer: Recent Labs    05/15/21 1946  DDIMER 2.76*    Hgb A1c: No results for input(s): HGBA1C in the last 72 hours. Lipid Profile: No results for input(s): CHOL, HDL, LDLCALC, TRIG, CHOLHDL, LDLDIRECT in the last 72 hours. Thyroid function studies: No results for input(s): TSH, T4TOTAL, T3FREE, THYROIDAB in the last 72 hours.  Invalid input(s): FREET3 Anemia work up: No results for input(s): VITAMINB12, FOLATE, FERRITIN, TIBC, IRON, RETICCTPCT in the last 72 hours. Sepsis Labs: Recent Labs  Lab 05/15/21 1946 05/15/21 2214 05/16/21 0601 05/17/21 0250  PROCALCITON <0.10  --   --   --   WBC 22.7*  --   --  13.7*  LATICACIDVEN 1.4 2.2* 1.1  --     Microbiology Recent Results (from the past 240 hour(s))  Resp Panel by RT-PCR (Flu A&B, Covid) Nasopharyngeal Swab     Status: None   Collection Time: 05/15/21  7:46 PM   Specimen: Nasopharyngeal Swab; Nasopharyngeal(NP) swabs in vial transport medium  Result Value Ref Range Status   SARS Coronavirus 2 by RT PCR NEGATIVE NEGATIVE Final    Comment: (NOTE) SARS-CoV-2 target nucleic acids are NOT DETECTED.  The SARS-CoV-2 RNA is generally detectable in upper respiratory specimens during the acute phase of infection. The lowest concentration of SARS-CoV-2 viral copies  this assay can  detect is 138 copies/mL. A negative result does not preclude SARS-Cov-2 infection and should not be used as the sole basis for treatment or other patient management decisions. A negative result may occur with  improper specimen collection/handling, submission of specimen other than nasopharyngeal swab, presence of viral mutation(s) within the areas targeted by this assay, and inadequate number of viral copies(<138 copies/mL). A negative result must be combined with clinical observations, patient history, and epidemiological information. The expected result is Negative.  Fact Sheet for Patients:  EntrepreneurPulse.com.au  Fact Sheet for Healthcare Providers:  IncredibleEmployment.be  This test is no t yet approved or cleared by the [place] FDA and  has been authorized for detection and/or diagnosis of SARS-CoV-2 by FDA under an Emergency Use Authorization (EUA). This EUA will remain  in effect (meaning this test can be used) for the duration of the COVID-19 declaration under Section 564(b)(1) of the Act, 21 U.S.C.section 360bbb-3(b)(1), unless the authorization is terminated  or revoked sooner.       Influenza A by PCR NEGATIVE NEGATIVE Final   Influenza B by PCR NEGATIVE NEGATIVE Final    Comment: (NOTE) The Xpert Xpress SARS-CoV-2/FLU/RSV plus assay is intended as an aid in the diagnosis of influenza from Nasopharyngeal swab specimens and should not be used as a sole basis for treatment. Nasal washings and aspirates are unacceptable for Xpert Xpress SARS-CoV-2/FLU/RSV testing.  Fact Sheet for Patients: EntrepreneurPulse.com.au  Fact Sheet for Healthcare Providers: IncredibleEmployment.be  This test is not yet approved or cleared by the [place] FDA and has been authorized for detection and/or diagnosis of SARS-CoV-2 by FDA under an Emergency Use Authorization (EUA). This EUA  will remain in effect (meaning this test can be used) for the duration of the COVID-19 declaration under Section 564(b)(1) of the Act, 21 U.S.C. section 360bbb-3(b)(1), unless the authorization is terminated or revoked.  Performed at [facility], [address] [place], [place] [address]   Blood culture (routine single)     Status: None (Preliminary result)   Collection Time: 05/15/21  8:44 PM   Specimen: BLOOD  Result Value Ref Range Status   Specimen Description BLOOD RIGHT ANTECUBITAL  Final   Special Requests   Final    BOTTLES DRAWN AEROBIC AND ANAEROBIC Blood Culture adequate volume   Culture   Final    NO GROWTH 3 DAYS Performed at [facility], [address] [place], [place] [address]    Report Status PENDING  Incomplete  MRSA Next Gen by PCR, Nasal     Status: None   Collection Time: 05/16/21  4:11 AM   Specimen: Nasal Mucosa; Nasal Swab  Result Value Ref Range Status   MRSA by PCR Next Gen NOT DETECTED NOT DETECTED Final    Comment: (NOTE) The GeneXpert MRSA Assay (FDA approved for NASAL specimens only), is one component of a comprehensive MRSA colonization surveillance program. It is not intended to diagnose MRSA infection nor to guide or monitor treatment for MRSA infections. Test performance is not FDA approved in patients less than 90 years old. Performed at [facility], [place] [address] [place], [place] [address]      Medications:    carbamazepine  200 mg Oral TID   Chlorhexidine Gluconate Cloth  6 each Topical Q0600   DULoxetine  20 mg Oral Daily   enoxaparin (LOVENOX) injection  1 mg/kg Subcutaneous Q12H   mouth rinse  15 mL Mouth Rinse BID   mometasone-formoterol  2 puff Inhalation  BID   umeclidinium bromide  1 puff Inhalation Daily   Warfarin - Pharmacist Dosing Inpatient   Does not apply q1600   Continuous Infusions:    LOS: 2 days   Charlynne Cousins  [facility]  05/18/2021, 7:57 AM

## 2021-05-18 NOTE — Progress Notes (Signed)
Physical Therapy Evaluation Patient Details Name: Jorge Rosales MRN: [id number] DOB: September 03, 1946 Today's Date: 05/18/2021   History of Present Illness  75 y.o. male presenting to [facility] ED 6/19 with progressive SOB x2 weeks. SpO2 79% on RA at home. chest CTA (+) acute PE and CT (+) R heart strain consistent with a submassive PE, started on heparin. PMHx significant for COPD and prior tobacco use.  Clinical Impression  Pt admitted with above diagnosis. Pt was able to ambulate without device with overall good stability.  Feel that a rollator would help pt with endurance but pt currently refusing.  Talked about how he could sit and take rest breaks.  Will try to continue to encourage this. Feel that pt will benefit from pulmonary rehab at d/c and pt states he has done pulmonary rehab at [facility] in the past. Needs 4LO2 at with activity.  Pt currently with functional limitations due to the deficits listed below (see PT Problem List). Pt will benefit from skilled PT to increase their independence and safety with mobility to allow discharge to the venue listed below.       Follow Up Recommendations  (Pulmonary rehab)    Equipment Recommendations  Other (comment) (Rollator but pt currently refuses, home O2)    Recommendations for Other Services       Precautions / Restrictions Precautions Precautions: None Restrictions Weight Bearing Restrictions: No      Mobility  Bed Mobility Overal bed mobility: Independent                  Transfers Overall transfer level: Independent                  Ambulation/Gait Ambulation/Gait assistance: Min guard;Supervision Gait Distance (Feet): 200 Feet Assistive device: None Gait Pattern/deviations: Step-through pattern;Decreased stride length;Drifts right/left   Gait velocity interpretation: <1.31 ft/sec, indicative of household ambulator General Gait Details: Pt was able to ambulate without device with overall steady gait with no significant  LOB.  Pt did need 4LO2 with activity to maintain sats >86%.  Stairs            Wheelchair Mobility    Modified Rankin (Stroke Patients Only)       Balance Overall balance assessment: No apparent balance deficits (not formally assessed)                                           Pertinent Vitals/Pain Pain Assessment: No/denies pain    Home Living Family/patient expects to be discharged to:: Private residence Living Arrangements: Alone Available Help at Discharge: Family;Available PRN/intermittently Type of Home: Apartment Home Access: Level entry     Home Layout: One level Home Equipment: Shower seat Additional Comments: States shower seat does not fit inside of his tub    Prior Function Level of Independence: Independent         Comments: I with ADLs/IADLs without AD; drives; reports ordering takeout more than cooking for convenience     Hand Dominance        Extremity/Trunk Assessment   Upper Extremity Assessment Upper Extremity Assessment: Defer to OT evaluation    Lower Extremity Assessment Lower Extremity Assessment: Generalized weakness    Cervical / Trunk Assessment Cervical / Trunk Assessment: Normal  Communication   Communication: No difficulties  Cognition Arousal/Alertness: Awake/alert Behavior During Therapy: WFL for tasks assessed/performed Overall Cognitive Status: Within Functional Limits for  tasks assessed                                        General Comments General comments (skin integrity, edema, etc.): SpO2 97% on 4L upon entry. Desat to 80% on 4L via [place] with short-distance mobility in room.    Exercises     Assessment/Plan    PT Assessment Patient needs continued PT services  PT Problem List Decreased balance;Decreased activity tolerance;Decreased mobility;Decreased knowledge of use of DME;Decreased safety awareness;Decreased knowledge of precautions;Cardiopulmonary status limiting  activity       PT Treatment Interventions DME instruction;Gait training;Functional mobility training;Therapeutic activities;Therapeutic exercise;Balance training;Patient/family education    PT Goals (Current goals can be found in the Care Plan section)  Acute Rehab PT Goals Patient Stated Goal: To return home. PT Goal Formulation: With patient Time For Goal Achievement: 06/01/21 Potential to Achieve Goals: Good    Frequency Min 3X/week   Barriers to discharge        Co-evaluation               AM-PAC PT "6 Clicks" Mobility  Outcome Measure Help needed turning from your back to your side while in a flat bed without using bedrails?: None Help needed moving from lying on your back to sitting on the side of a flat bed without using bedrails?: None Help needed moving to and from a bed to a chair (including a wheelchair)?: None Help needed standing up from a chair using your arms (e.g., wheelchair or bedside chair)?: A Ciotti Help needed to walk in hospital room?: A Barcelo Help needed climbing 3-5 steps with a railing? : A Kratt 6 Click Score: 21    End of Session Equipment Utilized During Treatment: Gait belt;Oxygen Activity Tolerance: Patient tolerated treatment well Patient left: in chair;with call bell/phone within reach;with chair alarm set Nurse Communication: Mobility status PT Visit Diagnosis: Muscle weakness (generalized) (M62.81)    Time: 3664-4034 PT Time Calculation (min) (ACUTE ONLY): 17 min   Charges:   PT Evaluation $PT Eval Moderate Complexity: 1 Mod          Clifford Coudriet M,PT Acute Rehab Services [phone] (pager)   Alvira Philips 05/18/2021, 10:02 AM

## 2021-05-19 ENCOUNTER — Other Ambulatory Visit (HOSPITAL_COMMUNITY): Payer: Self-pay

## 2021-05-19 DIAGNOSIS — I2699 Other pulmonary embolism without acute cor pulmonale: Secondary | ICD-10-CM | POA: Diagnosis not present

## 2021-05-19 LAB — PROTIME-INR
INR: 1.8 — ABNORMAL HIGH (ref 0.8–1.2)
Prothrombin Time: 21.1 seconds — ABNORMAL HIGH (ref 11.4–15.2)

## 2021-05-19 MED ORDER — ENOXAPARIN SODIUM 100 MG/ML IJ SOSY
100.0000 mg | PREFILLED_SYRINGE | INTRAMUSCULAR | 0 refills | Status: DC
  Filled 2021-05-19: qty 4, 4d supply, fill #0

## 2021-05-19 MED ORDER — ENOXAPARIN SODIUM 100 MG/ML IJ SOSY
100.0000 mg | PREFILLED_SYRINGE | INTRAMUSCULAR | Status: DC
  Administered 2021-05-19: 100 mg via SUBCUTANEOUS
  Filled 2021-05-19: qty 1

## 2021-05-19 MED ORDER — WARFARIN SODIUM 2.5 MG PO TABS
2.5000 mg | ORAL_TABLET | Freq: Every day | ORAL | 0 refills | Status: DC
  Filled 2021-05-19: qty 30, 30d supply, fill #0

## 2021-05-19 MED ORDER — ENOXAPARIN SODIUM 60 MG/0.6ML IJ SOSY
1.0000 mg/kg | PREFILLED_SYRINGE | Freq: Two times a day (BID) | INTRAMUSCULAR | 0 refills | Status: DC
  Filled 2021-05-19: qty 4.8, 4d supply, fill #0

## 2021-05-19 MED ORDER — ENOXAPARIN SODIUM 100 MG/ML IJ SOSY: 90.0000 mg | PREFILLED_SYRINGE | INTRAMUSCULAR | Status: DC

## 2021-05-19 MED ORDER — ENOXAPARIN SODIUM 100 MG/ML IJ SOSY
90.0000 mg | PREFILLED_SYRINGE | INTRAMUSCULAR | 0 refills | Status: DC
  Filled 2021-05-19: qty 4, 4d supply, fill #0

## 2021-05-19 NOTE — Discharge Summary (Addendum)
Physician Discharge Summary  Jorge Rosales DGL:[id number] DOB: 18-Jul-1946 DOA: 05/16/2021  PCP: [facility]  Admit date: 05/16/2021 Discharge date: 05/19/2021  Admitted From: Home Disposition:  Home  Recommendations for Outpatient Follow-up:  Follow up with PCP in 1-2 weeks Please obtain BMP/CBC in one week   Home Health:None Equipment/Devices:Oxygen  Discharge Condition:Stable CODE STATUS:Full Diet recommendation: Heart Healthy  Brief/Interim Summary: 75 y.o. male 75 year old with past medical history of COPD and tobacco abuse who went to the [facility] ED for shortness of breath that started 2 prior to admissions, CTA protocol was positive for PE with right heart strain elevated mild troponins was started on a heparin drip and BiPAP, Transferred to [facility] saturating well on 6 L  Discharge Diagnoses:  Active Problems:   Pulmonary embolism (HCC)  Acute respiratory failure with hypoxia secondary to massive PE and lower ext DVT: Started on IV heparin he was requiring 6 L of oxygen his heart rate in his blood pressure remained stable. Transition to Lovenox, as we cannot use a DOAC as he is on carbamazepine. He was also started on Coumadin. He has been taught how to use the Lovenox out. We will schedule Coumadin appointment to follow-up he will go on Lovenox and Coumadin for 4 additional days. He also go on oxygen 3 L.  Leukocytosis: Likely stress margination and the use of steroids they have been discontinued, he has remained afebrile his leukocytosis is improved.  COPD without exacerbation: Noted stable continue inhalers.  Discharge Instructions  Discharge Instructions     Diet - low sodium heart healthy   Complete by: As directed    Increase activity slowly   Complete by: As directed       Allergies as of 05/19/2021   No Known Allergies      Medication List     STOP taking these medications    albuterol 108 (90 Base) MCG/ACT  inhaler Commonly known as: ProAir HFA   Flutter Devi       TAKE these medications    budesonide-formoterol 160-4.5 MCG/ACT inhaler Commonly known as: Symbicort Take 2 puffs first thing in am and then another 2 puffs about 12 hours later.   carbamazepine 200 MG tablet Commonly known as: TEGRETOL Take 200 mg by mouth 3 (three) times daily.   DULoxetine 20 MG capsule Commonly known as: CYMBALTA Take 1 tablet by mouth daily.   enoxaparin 100 MG/ML injection Commonly known as: LOVENOX Inject 1 mL (100 mg total) into the skin daily. Start taking on: May 20, 2021   TRELEGY ELLIPTA IN Inhale 1 puff into the lungs daily.   warfarin 2.5 MG tablet Commonly known as: Coumadin Take 1 tablet (2.5 mg total) by mouth daily.               Durable Medical Equipment  (From admission, onward)           Start     Ordered   05/18/21 1341  For home use only DME oxygen  Once       Question Answer Comment  Length of Need 6 Months   Mode or (Route) Nasal cannula   Liters per Minute 3   Frequency Continuous (stationary and portable oxygen unit needed)   Oxygen delivery system Gas      05/18/21 1341            Follow-up Information     [facility]. Go on 05/21/2021.   Why: Please attend your appointment on  Friday, May 21, 2021, at 10:30am. Contact information: [address] [place], [place] [address] [phone]        [facility]. Go on 05/21/2021.   Why: Please attend your appointment with NP Vevelyn Royals on Friday, 05/21/21, at 8am.  This will initiate your INR/coumadin clinic monitoring. Contact information: [place] [address] [phone]         [facility] [employer] Follow up.   Why: This company, now called [employer], will provide home oxygen.  Please call them with any concerns/problems. Contact information: [place] [address] [phone]                No  Known Allergies  Consultations: Pulmonary and critical care   Procedures/Studies: CT Angio Chest PE W and/or Wo Contrast  Result Date: 05/15/2021 CLINICAL DATA:  Pulmonary embolus suspected with high probability. EXAM: CT ANGIOGRAPHY CHEST WITH CONTRAST TECHNIQUE: Multidetector CT imaging of the chest was performed using the standard protocol during bolus administration of intravenous contrast. Multiplanar CT image reconstructions and MIPs were obtained to evaluate the vascular anatomy. CONTRAST:  63mL OMNIPAQUE IOHEXOL 350 MG/ML SOLN COMPARISON:  Chest radiograph 05/15/2021 FINDINGS: Cardiovascular: There is good opacification of the central and segmental pulmonary arteries. Filling defects are demonstrated in the distal right lobar artery, extending into upper and lower lobe segmental branches. Filling defects are also demonstrated in left upper lung segmental and subsegmental branches. Changes are consistent with acute pulmonary embolus. RV to LV ratio is 1.2. No pericardial effusions. Normal caliber thoracic aorta. No evidence of aortic dissection. Mild coronary artery and aortic calcification. Mediastinum/Nodes: Esophagus is decompressed. Filling defect in the trachea likely representing mucoid material. No significant lymphadenopathy. Calcified lymph nodes are present in the mediastinum and left hilum. Lungs/Pleura: Emphysematous changes in the lungs. Linear scarring and consolidation in the left upper lung. Focal pleural thickening and scarring in the right upper lung. Scattered calcified granulomas. No pleural effusions. Upper Abdomen: No acute abnormalities demonstrated in the visualized upper abdomen. Musculoskeletal: Degenerative changes throughout the spine. No destructive bone lesions. Review of the MIP images confirms the above findings. IMPRESSION: 1. Positive for acute PE with CT evidence of right heart strain (RV/LV Ratio = 1.2) consistent with at least submassive (intermediate risk) PE.  The presence of right heart strain has been associated with an increased risk of morbidity and mortality. Please refer to the "PE Focused" order set in EPIC. 2. Scarring and consolidation in the upper lungs, greater on the left. This is likely chronic process, possibly postinflammatory. 3. Emphysematous changes in the lungs. 4. Aortic atherosclerosis. Critical Value/emergent results were called by telephone at the time of interpretation on 05/15/2021 at 9:53 pm to provider [facility] , who verbally acknowledged these results. Electronically Signed   By: Lucienne Capers M.D.   On: 05/15/2021 21:56   DG Chest Portable 1 View  Result Date: 05/15/2021 CLINICAL DATA:  Shortness of breath for 2 weeks.  COPD history. EXAM: PORTABLE CHEST 1 VIEW COMPARISON:  Most recent imaging radiograph 01/27/2015 FINDINGS: Patient is rotated. Heart is normal in size. Questionable left hilar retraction. An area of previous left upper lobe opacity with air-fluid levels no demonstrates streaky densities with some architectural distortion, favoring scarring but nonspecific. Lungs are hyperinflated with bronchial thickening. No pneumothorax or large pleural effusion. No acute osseous abnormalities are seen. IMPRESSION: 1. Area of previous left upper lobe opacity with air-fluid levels demonstrates streaky densities with architectural distortion, nonspecific. Findings  favor scarring, however given lack of interval exams to demonstrate stability, unless performed elsewhere, chest CT (with IV contrast for hilar assessment) is recommended for further assessment. This could be performed on an elective basis. 2. Hyperinflation and bronchial thickening, imaging findings consistent with COPD. Electronically Signed   By: Keith Rake M.D.   On: 05/15/2021 19:58   ECHOCARDIOGRAM COMPLETE BUBBLE STUDY  Result Date: 05/16/2021    ECHOCARDIOGRAM REPORT   Patient Name:   Jorge Rosales Date of Exam: 05/16/2021 Medical Rec #:  [id number]     Height:       70.0 in Accession #:    [id number]   Weight:       133.8 lb Date of Birth:  02-24-1946     BSA:          1.760 m Patient Age:    23 years     BP:           118/63 mmHg Patient Gender: M            HR:           78 bpm. Exam Location:  Inpatient Procedure: 2D Echo, Cardiac Doppler and Color Doppler Indications:    Pulmonary embolus  History:        Patient has no prior history of Echocardiogram examinations.                 COPD; Risk Factors:Former Smoker.  Sonographer:    Cammy Brochure Referring Phys: [place]  1. Left ventricular ejection fraction, by estimation, is 60 to 65%. The left ventricle has normal function. Left ventricular endocardial border not optimally defined to evaluate regional wall motion. Left ventricular diastolic parameters are consistent with Grade I diastolic dysfunction (impaired relaxation).  2. Right ventricular systolic function is normal. The right ventricular size is normal.  3. The mitral valve is normal in structure. No evidence of mitral valve regurgitation. No evidence of mitral stenosis.  4. The aortic valve was not well visualized. There is mild calcification of the aortic valve. There is mild thickening of the aortic valve. Aortic valve regurgitation is not visualized. No aortic stenosis is present.  5. The inferior vena cava is normal in size with greater than 50% respiratory variability, suggesting right atrial pressure of 3 mmHg. FINDINGS  Left Ventricle: Left ventricular ejection fraction, by estimation, is 60 to 65%. The left ventricle has normal function. Left ventricular endocardial border not optimally defined to evaluate regional wall motion. The left ventricular internal cavity size was normal in size. There is no left ventricular hypertrophy. Left ventricular diastolic parameters are consistent with Grade I diastolic dysfunction (impaired relaxation). Normal left ventricular filling pressure. Right Ventricle: The right  ventricular size is normal. No increase in right ventricular wall thickness. Right ventricular systolic function is normal. Left Atrium: Left atrial size was normal in size. Right Atrium: Right atrial size was normal in size. Pericardium: There is no evidence of pericardial effusion. Mitral Valve: The mitral valve is normal in structure. No evidence of mitral valve regurgitation. No evidence of mitral valve stenosis. Tricuspid Valve: The tricuspid valve is normal in structure. Tricuspid valve regurgitation is trivial. No evidence of tricuspid stenosis. Aortic Valve: The aortic valve was not well visualized. There is mild calcification of the aortic valve. There is mild thickening of the aortic valve. There is mild aortic valve annular calcification. Aortic valve regurgitation is not visualized. No aortic stenosis is present. Aortic valve mean gradient measures  2.0 mmHg. Aortic valve peak gradient measures 4.4 mmHg. Aortic valve area, by VTI measures 2.84 cm. Pulmonic Valve: The pulmonic valve was not well visualized. Pulmonic valve regurgitation is not visualized. No evidence of pulmonic stenosis. Aorta: The aortic root is normal in size and structure. Pulmonary Artery: Indeterminant PASP, inadequate TR jet. Venous: The inferior vena cava is normal in size with greater than 50% respiratory variability, suggesting right atrial pressure of 3 mmHg. IAS/Shunts: No atrial level shunt detected by color flow Doppler.  LEFT VENTRICLE PLAX 2D LVIDd:         4.20 cm  Diastology LVIDs:         2.60 cm  LV e' medial:    7.72 cm/s LV PW:         0.80 cm  LV E/e' medial:  9.5 LV IVS:        1.00 cm  LV e' lateral:   8.38 cm/s LVOT diam:     2.10 cm  LV E/e' lateral: 8.7 LV SV:         61 LV SV Index:   35 LVOT Area:     3.46 cm  RIGHT VENTRICLE             IVC RV Basal diam:  4.70 cm     IVC diam: 1.20 cm RV S prime:     12.20 cm/s TAPSE (M-mode): 1.7 cm LEFT ATRIUM             Index       RIGHT ATRIUM           Index LA diam:         2.20 cm 1.25 cm/m  RA Area:     11.30 cm LA Vol (A2C):   28.6 ml 16.25 ml/m RA Volume:   25.60 ml  14.55 ml/m LA Vol (A4C):   28.9 ml 16.42 ml/m LA Biplane Vol: 30.4 ml 17.28 ml/m  AORTIC VALVE AV Area (Vmax):    3.18 cm AV Area (Vmean):   2.99 cm AV Area (VTI):     2.84 cm AV Vmax:           105.00 cm/s AV Vmean:          72.100 cm/s AV VTI:            0.215 m AV Peak Grad:      4.4 mmHg AV Mean Grad:      2.0 mmHg LVOT Vmax:         96.35 cm/s LVOT Vmean:        62.250 cm/s LVOT VTI:          0.176 m LVOT/AV VTI ratio: 0.82  AORTA Ao Root diam: 3.30 cm Ao Asc diam:  3.30 cm MITRAL VALVE MV Area (PHT): 3.85 cm    SHUNTS MV Decel Time: 197 msec    Systemic VTI:  0.18 m MV E velocity: 73.10 cm/s  Systemic Diam: 2.10 cm MV A velocity: 96.70 cm/s MV E/A ratio:  0.76 Carlyle Dolly MD Electronically signed by Carlyle Dolly MD Signature Date/Time: 05/16/2021/12:58:33 PM    Final    VAS [place] LOWER EXTREMITY VENOUS (DVT)  Result Date: 05/17/2021  Lower Venous DVT Study Patient Name:  Jorge Rosales  Date of Exam:   05/17/2021 Medical Rec #: [id number]     Accession #:    [id number] Date of Birth: [birth year]      Patient Gender: M Patient Age:   67Y Exam Location:  [facility] Procedure:      VAS [place] LOWER EXTREMITY VENOUS (DVT) Referring Phys: [id number] FELIZ ORTIZ --------------------------------------------------------------------------------  Indications: Pulmonary embolism.  Comparison Study: No prior study Performing Technologist: Sharion Dove RVS  Examination Guidelines: A complete evaluation includes B-mode imaging, spectral Doppler, color Doppler, and power Doppler as needed of all accessible portions of each vessel. Bilateral testing is considered an integral part of a complete examination. Limited examinations for reoccurring indications may be performed as noted. The reflux portion of the exam is performed with the patient in reverse Trendelenburg.   +---------+---------------+---------+-----------+----------+------------------+ RIGHT    CompressibilityPhasicitySpontaneityPropertiesThrombus Aging     +---------+---------------+---------+-----------+----------+------------------+ CFV      Partial        Yes      Yes                  Non occlusive,                                                           Acute              +---------+---------------+---------+-----------+----------+------------------+ SFJ      Full                                                            +---------+---------------+---------+-----------+----------+------------------+ FV Prox  Full                                                            +---------+---------------+---------+-----------+----------+------------------+ FV Mid   Full                                                            +---------+---------------+---------+-----------+----------+------------------+ FV DistalFull                                                            +---------+---------------+---------+-----------+----------+------------------+ PFV      Full                                                            +---------+---------------+---------+-----------+----------+------------------+ POP      Full                                                            +---------+---------------+---------+-----------+----------+------------------+  PTV      None                                         Acute              +---------+---------------+---------+-----------+----------+------------------+ PERO     Full                                                            +---------+---------------+---------+-----------+----------+------------------+ EIV                     Yes      Yes                  patent             +---------+---------------+---------+-----------+----------+------------------+    +---------+---------------+---------+-----------+----------+--------------+ LEFT     CompressibilityPhasicitySpontaneityPropertiesThrombus Aging +---------+---------------+---------+-----------+----------+--------------+ CFV      Full           Yes      Yes                                 +---------+---------------+---------+-----------+----------+--------------+ SFJ      Full                                                        +---------+---------------+---------+-----------+----------+--------------+ FV Prox  Full                                                        +---------+---------------+---------+-----------+----------+--------------+ FV Mid   Full                                                        +---------+---------------+---------+-----------+----------+--------------+ FV DistalFull                                                        +---------+---------------+---------+-----------+----------+--------------+ PFV      Full                                                        +---------+---------------+---------+-----------+----------+--------------+ POP      Full           Yes      Yes                                 +---------+---------------+---------+-----------+----------+--------------+  PTV      Full                                                        +---------+---------------+---------+-----------+----------+--------------+ PERO     Full                                                        +---------+---------------+---------+-----------+----------+--------------+     Summary: RIGHT: - Findings consistent with acute deep vein thrombosis involving the right common femoral vein, and right posterior tibial veins.  LEFT: - There is no evidence of deep vein thrombosis in the lower extremity.  *See table(s) above for measurements and observations. Electronically signed by Ruta Hinds MD on 05/17/2021 at 6:50:15 PM.     Final    (Echo, Carotid, EGD, Colonoscopy, ERCP)    Subjective: No complaints feels great  Discharge Exam: Vitals:   05/18/21 2023 05/19/21 0908  BP:    Pulse:    Resp:    Temp:    SpO2: 98% 98%   Vitals:   05/18/21 1407 05/18/21 1958 05/18/21 2023 05/19/21 0908  BP: 131/71 137/69    Pulse: 85 86    Resp: 16 19    Temp: 97.7 F (36.5 C) 97.8 F (36.6 C)    TempSrc: Oral     SpO2: 99% 98% 98% 98%  Weight:      Height:        General: Pt is alert, awake, not in acute distress Cardiovascular: RRR, S1/S2 +, no rubs, no gallops Respiratory: CTA bilaterally, no wheezing, no rhonchi Abdominal: Soft, NT, ND, bowel sounds + Extremities: no edema, no cyanosis    The results of significant diagnostics from this hospitalization (including imaging, microbiology, ancillary and laboratory) are listed below for reference.     Microbiology: Recent Results (from the past 240 hour(s))  Resp Panel by RT-PCR (Flu A&B, Covid) Nasopharyngeal Swab     Status: None   Collection Time: 05/15/21  7:46 PM   Specimen: Nasopharyngeal Swab; Nasopharyngeal(NP) swabs in vial transport medium  Result Value Ref Range Status   SARS Coronavirus 2 by RT PCR NEGATIVE NEGATIVE Final    Comment: (NOTE) SARS-CoV-2 target nucleic acids are NOT DETECTED.  The SARS-CoV-2 RNA is generally detectable in upper respiratory specimens during the acute phase of infection. The lowest concentration of SARS-CoV-2 viral copies this assay can detect is 138 copies/mL. A negative result does not preclude SARS-Cov-2 infection and should not be used as the sole basis for treatment or other patient management decisions. A negative result may occur with  improper specimen collection/handling, submission of specimen other than nasopharyngeal swab, presence of viral mutation(s) within the areas targeted by this assay, and inadequate number of viral copies(<138 copies/mL). A negative result must be combined  with clinical observations, patient history, and epidemiological information. The expected result is Negative.  Fact Sheet for Patients:  EntrepreneurPulse.com.au  Fact Sheet for Healthcare Providers:  IncredibleEmployment.be  This test is no t yet approved or cleared by the [place] FDA and  has been authorized for detection and/or diagnosis of SARS-CoV-2 by FDA under an Emergency Use Authorization (EUA). This EUA  will remain  in effect (meaning this test can be used) for the duration of the COVID-19 declaration under Section 564(b)(1) of the Act, 21 U.S.C.section 360bbb-3(b)(1), unless the authorization is terminated  or revoked sooner.       Influenza A by PCR NEGATIVE NEGATIVE Final   Influenza B by PCR NEGATIVE NEGATIVE Final    Comment: (NOTE) The Xpert Xpress SARS-CoV-2/FLU/RSV plus assay is intended as an aid in the diagnosis of influenza from Nasopharyngeal swab specimens and should not be used as a sole basis for treatment. Nasal washings and aspirates are unacceptable for Xpert Xpress SARS-CoV-2/FLU/RSV testing.  Fact Sheet for Patients: EntrepreneurPulse.com.au  Fact Sheet for Healthcare Providers: IncredibleEmployment.be  This test is not yet approved or cleared by the [place] FDA and has been authorized for detection and/or diagnosis of SARS-CoV-2 by FDA under an Emergency Use Authorization (EUA). This EUA will remain in effect (meaning this test can be used) for the duration of the COVID-19 declaration under Section 564(b)(1) of the Act, 21 U.S.C. section 360bbb-3(b)(1), unless the authorization is terminated or revoked.  Performed at [facility], [address] [place], [place] [address]   Blood culture (routine single)     Status: None (Preliminary result)   Collection Time: 05/15/21  8:44 PM   Specimen: BLOOD  Result Value Ref Range Status   Specimen  Description BLOOD RIGHT ANTECUBITAL  Final   Special Requests   Final    BOTTLES DRAWN AEROBIC AND ANAEROBIC Blood Culture adequate volume   Culture   Final    NO GROWTH 4 DAYS Performed at [facility], [address] [place], [place] [address]    Report Status PENDING  Incomplete  MRSA Next Gen by PCR, Nasal     Status: None   Collection Time: 05/16/21  4:11 AM   Specimen: Nasal Mucosa; Nasal Swab  Result Value Ref Range Status   MRSA by PCR Next Gen NOT DETECTED NOT DETECTED Final    Comment: (NOTE) The GeneXpert MRSA Assay (FDA approved for NASAL specimens only), is one component of a comprehensive MRSA colonization surveillance program. It is not intended to diagnose MRSA infection nor to guide or monitor treatment for MRSA infections. Test performance is not FDA approved in patients less than 18 years old. Performed at [facility], [place] [address], [place], [place] [address]      Labs: BNP (last 3 results) No results for input(s): BNP in the last 8760 hours. Basic Metabolic Panel: Recent Labs  Lab 05/15/21 1946 05/16/21 0348 05/17/21 0250  NA 132* 134* 136  K 3.8 4.7 4.3  CL 97* 96* 101  CO2 26 26 28   GLUCOSE 121* 156* 122*  BUN 16 9 9   CREATININE 0.62 0.63 0.70  CALCIUM 8.4* 8.9 8.5*  MG 1.9 2.0 2.1  PHOS  --  3.0 3.6   Liver Function Tests: Recent Labs  Lab 05/15/21 1946  AST 24  ALT 20  ALKPHOS 127*  BILITOT 1.1  PROT 8.0  ALBUMIN 3.5   No results for input(s): LIPASE, AMYLASE in the last 168 hours. No results for input(s): AMMONIA in the last 168 hours. CBC: Recent Labs  Lab 05/15/21 1946 05/17/21 0250  WBC 22.7* 13.7*  NEUTROABS 20.1*  --   HGB 16.5 13.4  HCT 47.4 40.6  MCV 94.6 99.0  PLT 373 324   Cardiac Enzymes: No results for input(s): CKTOTAL, CKMB, CKMBINDEX, TROPONINI in the last 168 hours. BNP: Invalid input(s): POCBNP  CBG: Recent Labs  Lab 05/16/21 0720 05/16/21 1926  GLUCAP 129* 158*   D-Dimer No  results for input(s): DDIMER in the last 72 hours. Hgb A1c No results for input(s): HGBA1C in the last 72 hours. Lipid Profile No results for input(s): CHOL, HDL, LDLCALC, TRIG, CHOLHDL, LDLDIRECT in the last 72 hours. Thyroid function studies No results for input(s): TSH, T4TOTAL, T3FREE, THYROIDAB in the last 72 hours.  Invalid input(s): FREET3 Anemia work up No results for input(s): VITAMINB12, FOLATE, FERRITIN, TIBC, IRON, RETICCTPCT in the last 72 hours. Urinalysis No results found for: COLORURINE, APPEARANCEUR, [place], [place], GLUCOSEU, [place], [place], [place], PROTEINUR, UROBILINOGEN, NITRITE, LEUKOCYTESUR Sepsis Labs Invalid input(s): PROCALCITONIN,  WBC,  LACTICIDVEN Microbiology Recent Results (from the past 240 hour(s))  Resp Panel by RT-PCR (Flu A&B, Covid) Nasopharyngeal Swab     Status: None   Collection Time: 05/15/21  7:46 PM   Specimen: Nasopharyngeal Swab; Nasopharyngeal(NP) swabs in vial transport medium  Result Value Ref Range Status   SARS Coronavirus 2 by RT PCR NEGATIVE NEGATIVE Final    Comment: (NOTE) SARS-CoV-2 target nucleic acids are NOT DETECTED.  The SARS-CoV-2 RNA is generally detectable in upper respiratory specimens during the acute phase of infection. The lowest concentration of SARS-CoV-2 viral copies this assay can detect is 138 copies/mL. A negative result does not preclude SARS-Cov-2 infection and should not be used as the sole basis for treatment or other patient management decisions. A negative result may occur with  improper specimen collection/handling, submission of specimen other than nasopharyngeal swab, presence of viral mutation(s) within the areas targeted by this assay, and inadequate number of viral copies(<138 copies/mL). A negative result must be combined with clinical observations, patient history, and epidemiological information. The expected result is Negative.  Fact Sheet for Patients:   EntrepreneurPulse.com.au  Fact Sheet for Healthcare Providers:  IncredibleEmployment.be  This test is no t yet approved or cleared by the [place] FDA and  has been authorized for detection and/or diagnosis of SARS-CoV-2 by FDA under an Emergency Use Authorization (EUA). This EUA will remain  in effect (meaning this test can be used) for the duration of the COVID-19 declaration under Section 564(b)(1) of the Act, 21 U.S.C.section 360bbb-3(b)(1), unless the authorization is terminated  or revoked sooner.       Influenza A by PCR NEGATIVE NEGATIVE Final   Influenza B by PCR NEGATIVE NEGATIVE Final    Comment: (NOTE) The Xpert Xpress SARS-CoV-2/FLU/RSV plus assay is intended as an aid in the diagnosis of influenza from Nasopharyngeal swab specimens and should not be used as a sole basis for treatment. Nasal washings and aspirates are unacceptable for Xpert Xpress SARS-CoV-2/FLU/RSV testing.  Fact Sheet for Patients: EntrepreneurPulse.com.au  Fact Sheet for Healthcare Providers: IncredibleEmployment.be  This test is not yet approved or cleared by the [place] FDA and has been authorized for detection and/or diagnosis of SARS-CoV-2 by FDA under an Emergency Use Authorization (EUA). This EUA will remain in effect (meaning this test can be used) for the duration of the COVID-19 declaration under Section 564(b)(1) of the Act, 21 U.S.C. section 360bbb-3(b)(1), unless the authorization is terminated or revoked.  Performed at [facility], [address] [place], [place] [address]   Blood culture (routine single)     Status: None (Preliminary result)   Collection Time: 05/15/21  8:44 PM   Specimen: BLOOD  Result Value Ref Range Status   Specimen Description BLOOD RIGHT ANTECUBITAL  Final   Special Requests   Final  BOTTLES DRAWN AEROBIC AND ANAEROBIC Blood Culture adequate volume    Culture   Final    NO GROWTH 4 DAYS Performed at [facility], [address] [place], [place] [address]    Report Status PENDING  Incomplete  MRSA Next Gen by PCR, Nasal     Status: None   Collection Time: 05/16/21  4:11 AM   Specimen: Nasal Mucosa; Nasal Swab  Result Value Ref Range Status   MRSA by PCR Next Gen NOT DETECTED NOT DETECTED Final    Comment: (NOTE) The GeneXpert MRSA Assay (FDA approved for NASAL specimens only), is one component of a comprehensive MRSA colonization surveillance program. It is not intended to diagnose MRSA infection nor to guide or monitor treatment for MRSA infections. Test performance is not FDA approved in patients less than 74 years old. Performed at [facility], [place] [address], [place], [place] [address]      Time coordinating discharge: Over 40 minutes  SIGNED:   Charlynne Cousins, MD  [facility] 05/19/2021, 1:12 PM Pager   If 7PM-7AM, please contact night-coverage [email or web address] Password [email or web address]

## 2021-05-19 NOTE — Care Management Important Message (Signed)
Important Message  Patient Details  Name: Jorge Rosales MRN: [id number] Date of Birth: Apr 23, 1946   Medicare Important Message Given:  Yes     Barb Merino Angelene Rome 05/19/2021, 2:49 PM

## 2021-05-19 NOTE — TOC Benefit Eligibility Note (Signed)
Patient Teacher, English as a foreign language completed.    The patient is currently admitted and upon discharge could be taking Enoxaparin (Lovenox) 60 mg.  The current 8 syringe co-pay is, $27.67.   The patient is currently admitted and upon discharge could be taking Enoxaparin (Lovenox) 90 mg (using 100 mg syringes)  The current 4 syringe co-pay is, $32.76.   The patient is insured through [place], [place] Patient Advocate Specialist [place] Team Direct Number: [phone]  Fax: [phone]

## 2021-05-19 NOTE — TOC Transition Note (Signed)
Transition of Care [facility]) - CM/SW Discharge Note   Patient Details  Name: Jorge Rosales MRN: [id number] Date of Birth: September 04, 1946  [facility]) CM/SW Contact:  Joanne Chars, LCSW Phone Number: 05/19/2021, 12:01 PM   Clinical Narrative:   Pt discharging home.  Pulmonary rehab appt and INR/coumadin clinic both in place with [facility].  [employer] provided home O2.  Daughter here to transport pt home.  No other needs identified.      Final next level of care: Home/Self Care Barriers to Discharge: Barriers Resolved   Patient Goals and CMS Choice Patient states their goals for this hospitalization and ongoing recovery are:: "moving around better" CMS Medicare.gov Compare Post Acute Care list provided to::  (na)    Discharge Placement                       Discharge Plan and Services In-house Referral: Clinical Social Work   Post Acute Care Choice: Durable Medical Equipment          DME Arranged: Oxygen DME Agency: [employer] Date DME Agency Contacted: 05/18/21 Time DME Agency Contacted: 1312 Representative spoke with at DME Agency: Freda Munro HH Arranged: NA [place] Agency: NA        Social Determinants of Health ([place]) Interventions     Readmission Risk Interventions No flowsheet data found.

## 2021-05-20 LAB — CULTURE, BLOOD (SINGLE)
Culture: NO GROWTH
Special Requests: ADEQUATE

## 2021-06-01 ENCOUNTER — Other Ambulatory Visit (HOSPITAL_COMMUNITY): Payer: Self-pay

## 2021-08-19 IMAGING — CT CT ANGIO CHEST
2 of 6 series · 17 of 46 positions shown · IV contrast (APPLIED)
Comparison: Chest radiograph 05/15/2021

CLINICAL DATA: Pulmonary embolus suspected with high probability.

EXAM:
CT ANGIOGRAPHY CHEST WITH CONTRAST
TECHNIQUE: Multidetector CT imaging of the chest was performed using the
standard protocol during bolus administration of intravenous
contrast. Multiplanar CT image reconstructions and MIPs were
obtained to evaluate the vascular anatomy.
CONTRAST:  75mL OMNIPAQUE IOHEXOL 350 MG/ML SOLN

[Series 5: thins · axial · 0.73mm/px · z∈[-887,-567]mm · 14 of 439 slices shown]
[im 19/439  lung]
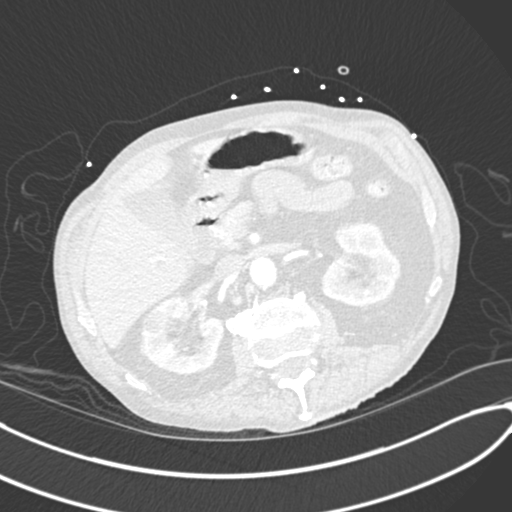
[im 55/439  soft-tissue]
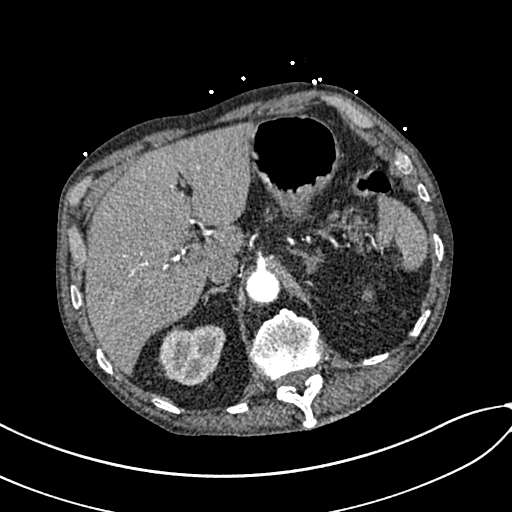
[im 92/439  lung]
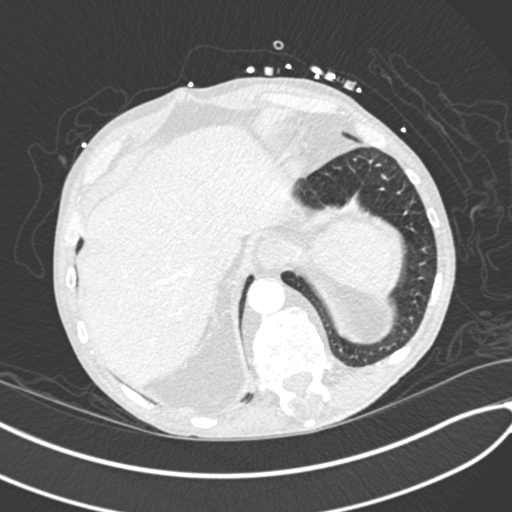
[im 110/439  soft-tissue]
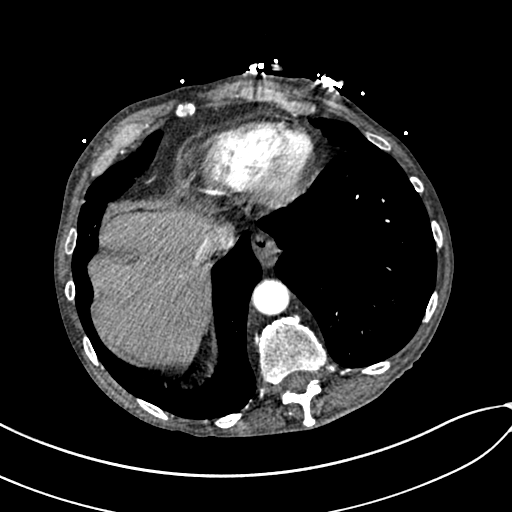
[im 147/439  lung]
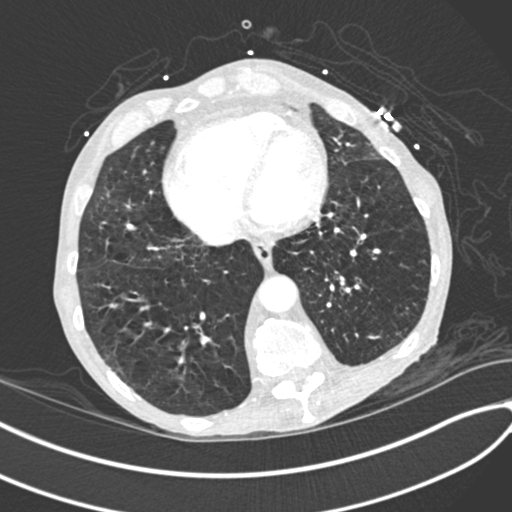
[im 183/439  soft-tissue]
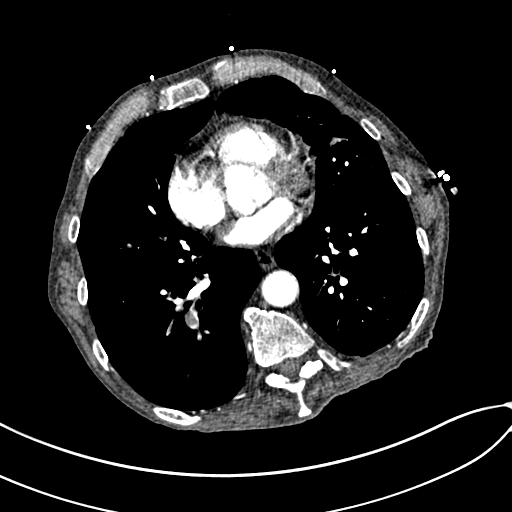
[im 201/439  lung]
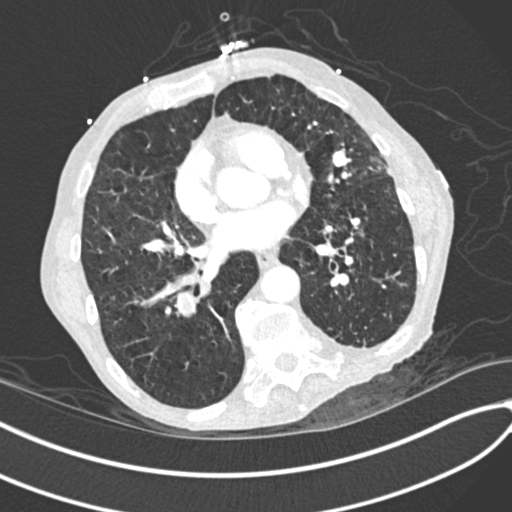
[im 238/439  soft-tissue]
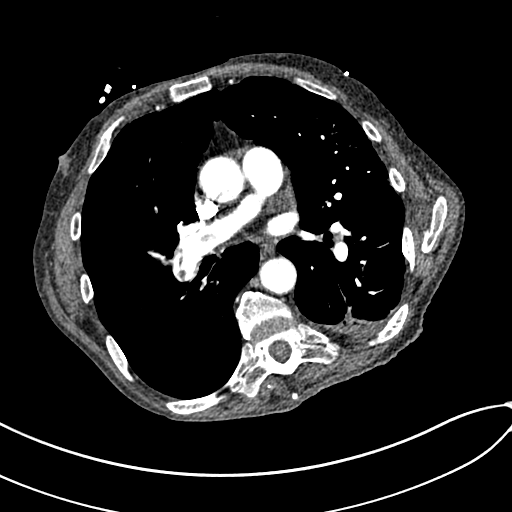
[im 256/439  lung]
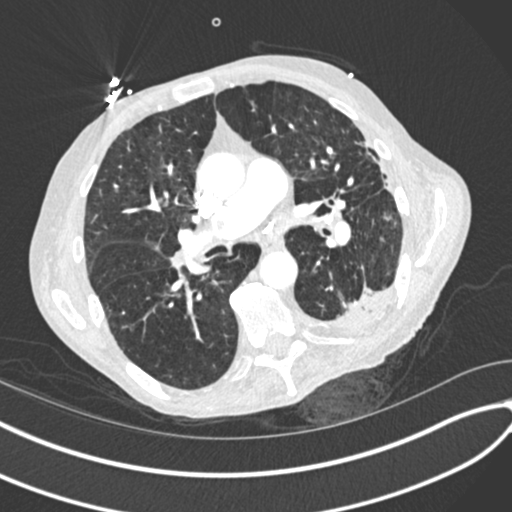
[im 293/439  soft-tissue]
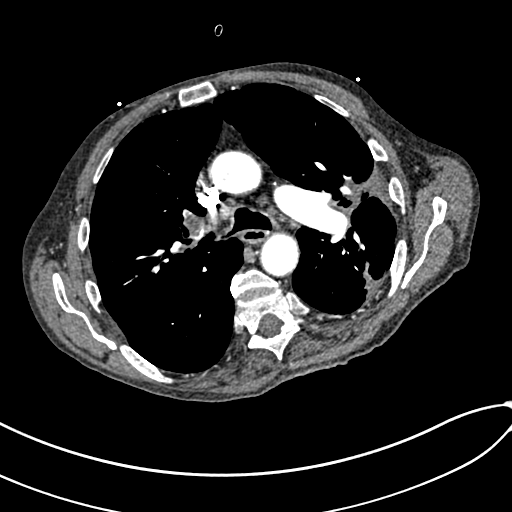
[im 329/439  lung]
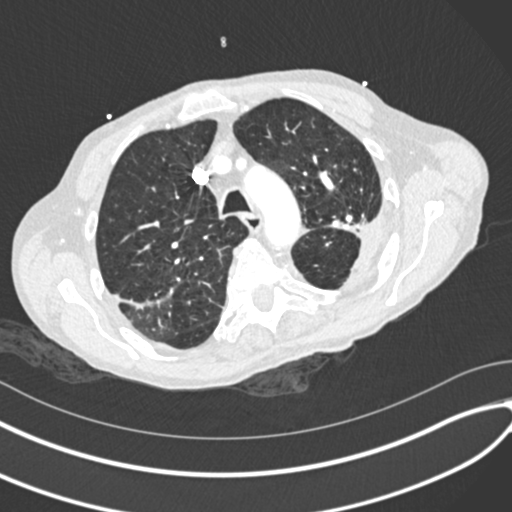
[im 347/439  soft-tissue]
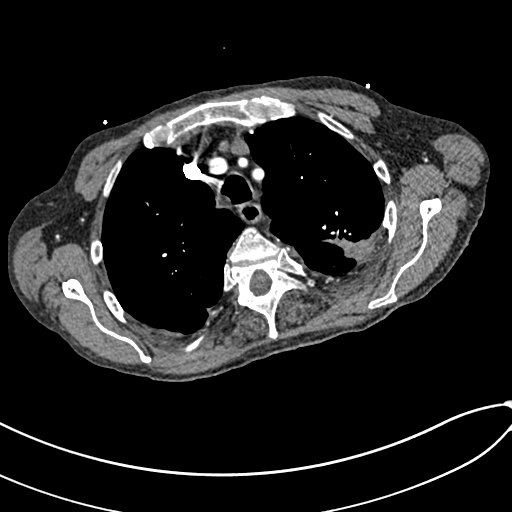
[im 384/439  lung]
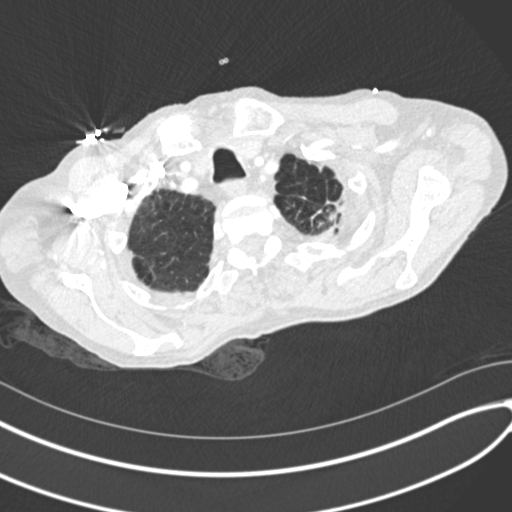
[im 420/439  soft-tissue]
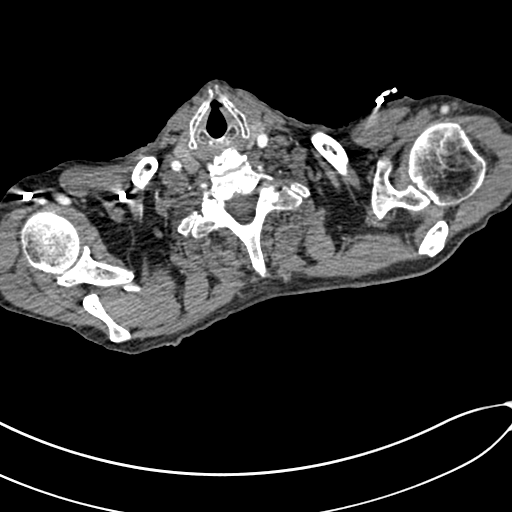

[Series 7: coronal mpr · coronal · 0.69mm/px · 3 of 106 slices shown]
[im 27/106  soft-tissue]
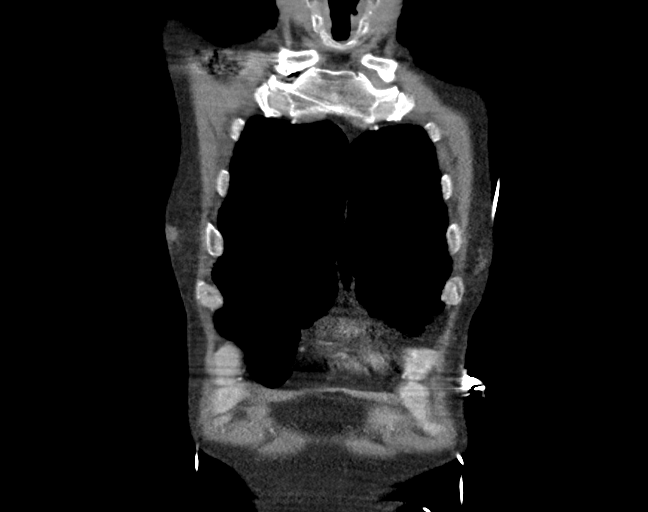
[im 53/106  soft-tissue]
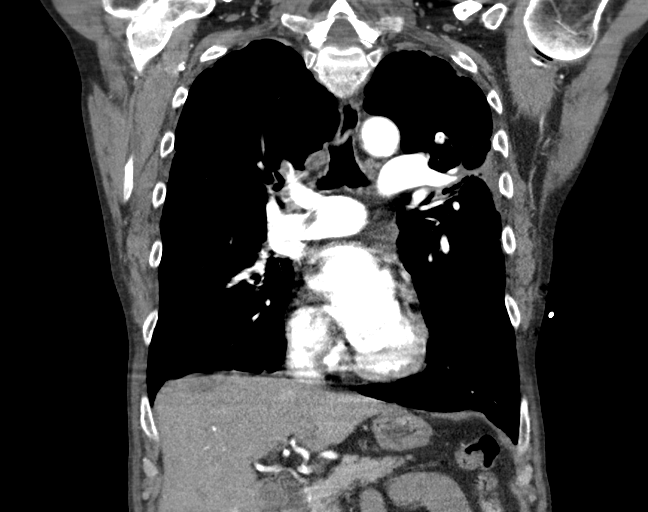
[im 79/106  soft-tissue]
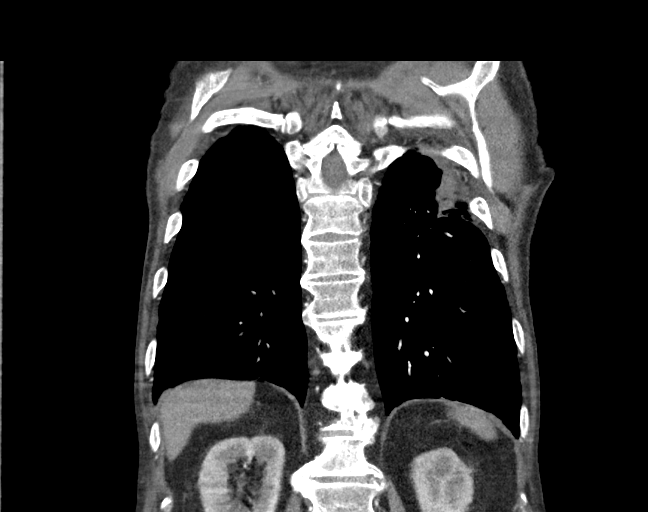

[17 of 46 positions shown; findings below may reference images not displayed]

FINDINGS: Cardiovascular: There is good opacification of the central and
segmental pulmonary arteries. Filling defects are demonstrated in
the distal right lobar artery, extending into upper and lower lobe
segmental branches. Filling defects are also demonstrated in left
upper lung segmental and subsegmental branches. Changes are
consistent with acute pulmonary embolus. RV to LV ratio is 1.2. No
pericardial effusions. Normal caliber thoracic aorta. No evidence of
aortic dissection. Mild coronary artery and aortic calcification.

Mediastinum/Nodes: Esophagus is decompressed. Filling defect in the
trachea likely representing mucoid material. No significant
lymphadenopathy. Calcified lymph nodes are present in the
mediastinum and left hilum.

Lungs/Pleura: Emphysematous changes in the lungs. Linear scarring
and consolidation in the left upper lung. Focal pleural thickening
and scarring in the right upper lung. Scattered calcified
granulomas. No pleural effusions.

Upper Abdomen: No acute abnormalities demonstrated in the visualized
upper abdomen.

Musculoskeletal: Degenerative changes throughout the spine. No
destructive bone lesions.

Review of the MIP images confirms the above findings.
IMPRESSION: 1. Positive for acute PE with CT evidence of right heart strain
(RV/LV Ratio = 1.2) consistent with at least submassive
(intermediate risk) PE. The presence of right heart strain has been
associated with an increased risk of morbidity and mortality. Please
refer to the "PE Focused" order set in [REDACTED].
2. Scarring and consolidation in the upper lungs, greater on the
left. This is likely chronic process, possibly postinflammatory.
3. Emphysematous changes in the lungs.
4. Aortic atherosclerosis.

Critical Value/emergent results were called by telephone at the time
of interpretation on 05/15/2021 at [DATE] to provider INGUNN HARPA RONLOR
, who verbally acknowledged these results.

## 2021-09-21 ENCOUNTER — Other Ambulatory Visit (HOSPITAL_COMMUNITY): Payer: Self-pay

## 2021-09-22 ENCOUNTER — Other Ambulatory Visit (HOSPITAL_COMMUNITY): Payer: Self-pay

## 2023-01-23 ENCOUNTER — Ambulatory Visit: Payer: Medicare Other | Attending: Cardiology

## 2023-01-23 ENCOUNTER — Encounter: Payer: Self-pay | Admitting: Cardiology

## 2023-01-23 ENCOUNTER — Ambulatory Visit: Payer: Medicare Other | Attending: Cardiology | Admitting: Cardiology

## 2023-01-23 VITALS — BP 118/84 | HR 97 | Ht 70.0 in | Wt 127.0 lb

## 2023-01-23 DIAGNOSIS — R9431 Abnormal electrocardiogram [ECG] [EKG]: Secondary | ICD-10-CM | POA: Insufficient documentation

## 2023-01-23 DIAGNOSIS — R001 Bradycardia, unspecified: Secondary | ICD-10-CM

## 2023-01-23 DIAGNOSIS — I2694 Multiple subsegmental pulmonary emboli without acute cor pulmonale: Secondary | ICD-10-CM | POA: Insufficient documentation

## 2023-01-23 DIAGNOSIS — J449 Chronic obstructive pulmonary disease, unspecified: Secondary | ICD-10-CM | POA: Diagnosis not present

## 2023-01-23 DIAGNOSIS — I272 Pulmonary hypertension, unspecified: Secondary | ICD-10-CM | POA: Diagnosis not present

## 2023-01-23 DIAGNOSIS — I2699 Other pulmonary embolism without acute cor pulmonale: Secondary | ICD-10-CM

## 2023-01-23 NOTE — Progress Notes (Unsigned)
Enrolled for [employer] to mail a ZIO XT long term holter monitor to the patients address on file.  

## 2023-01-23 NOTE — Patient Instructions (Addendum)
Medication Instructions:   No changes     Lab Work: Not needed   Testing/Procedures:  Will be in [place] or [place] has requested that you have an echocardiogram. Echocardiography is a painless test that uses sound waves to create images of your heart. It provides your doctor with information about the size and shape of your heart and how well your heart's chambers and valves are working. This procedure takes approximately one hour. There are no restrictions for this procedure. Please do NOT wear cologne, perfume, aftershave, or lotions (deodorant is allowed). Please arrive 15 minutes prior to your appointment time.    Will be mailed to you in 3 to 7 days Your physician has recommended that you wear a holter monitor. Holter monitors are medical devices that record the heart's electrical activity. Doctors most often use these monitors to diagnose arrhythmias. Arrhythmias are problems with the speed or rhythm of the heartbeat. The monitor is a small, portable device. You can wear one while you do your normal daily activities. This is usually used to diagnose what is causing palpitations/syncope (passing out).    Follow-Up: At [facility], you and your health needs are our priority.  As part of our continuing mission to provide you with exceptional heart care, we have created designated Provider Care Teams.  These Care Teams include your primary Cardiologist (physician) and Advanced Practice Providers (APPs -  Physician Assistants and Nurse Practitioners) who all work together to provide you with the care you need, when you need it.     Your next appointment:   2 to 3  month(s)  The format for your next appointment:   In Person  Provider:   Glenetta Hew, MD    Other Instructions  ZIO XT- Long Term Monitor Instructions  Your physician has requested you wear a ZIO patch monitor for 7 days.  This is a single patch monitor. [employer]  supplies one patch monitor per enrollment. Additional stickers are not available. Please do not apply patch if you will be having a Nuclear Stress Test,  Echocardiogram, Cardiac CT, MRI, or Chest Xray during the period you would be wearing the  monitor. The patch cannot be worn during these tests. You cannot remove and re-apply the  ZIO XT patch monitor.  Your ZIO patch monitor will be mailed 3 day USPS to your address on file. It may take 3-5 days  to receive your monitor after you have been enrolled.  Once you have received your monitor, please review the enclosed instructions. Your monitor  has already been registered assigning a specific monitor serial # to you.  Billing and Patient Assistance Program Information  We have supplied [employer] with any of your insurance information on file for billing purposes. [employer] offers a sliding scale Patient Assistance Program for patients that do not have  insurance, or whose insurance does not completely cover the cost of the ZIO monitor.  You must apply for the Patient Assistance Program to qualify for this discounted rate.  To apply, please call [employer] at [phone], select option 4, select option 2, ask to apply for  Patient Assistance Program. Theodore Demark will ask your household income, and how many people  are in your household. They will quote your out-of-pocket cost based on that information.  [employer] will also be able to set up a 64-month interest-free payment plan if needed.  Applying the monitor   Shave hair from upper left chest.  Hold abrader  disc by orange tab. Rub abrader in 40 strokes over the upper left chest as  indicated in your monitor instructions.  Clean area with 4 enclosed alcohol pads. Let dry.  Apply patch as indicated in monitor instructions. Patch will be placed under collarbone on left  side of chest with arrow pointing upward.  Rub patch adhesive wings for 2 minutes. Remove white label marked "1". Remove the white   label marked "2". Rub patch adhesive wings for 2 additional minutes.  While looking in a mirror, press and release button in center of patch. A small green light will  flash 3-4 times. This will be your only indicator that the monitor has been turned on.  Do not shower for the first 24 hours. You may shower after the first 24 hours.  Press the button if you feel a symptom. You will hear a small click. Record Date, Time and  Symptom in the Patient Logbook.  When you are ready to remove the patch, follow instructions on the last 2 pages of Patient  Logbook. Stick patch monitor onto the last page of Patient Logbook.  Place Patient Logbook in the blue and white box. Use locking tab on box and tape box closed  securely. The blue and white box has prepaid postage on it. Please place it in the mailbox as  soon as possible. Your physician should have your test results approximately 7 days after the  monitor has been mailed back to [facility].  Call [facility] at [phone] if you have questions regarding  your ZIO XT patch monitor. Call them immediately if you see an orange light blinking on your  monitor.  If your monitor falls off in less than 4 days, contact our Monitor department at [phone].  If your monitor becomes loose or falls off after 4 days call [employer] at [phone] for  suggestions on securing your monitor
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
## 2023-01-25 ENCOUNTER — Encounter: Payer: Self-pay | Admitting: Cardiology

## 2023-01-25 NOTE — Assessment & Plan Note (Signed)
Almost 2 years out from his multiple PEs.  He had a hypercoagulable workup done at that time.  He is now on long-term warfarin.  In the setting of significant COPD and dyspnea, worsening exertional dyspnea, reasonable to check 2D echo to assess for LVEF as well as pulmonary pressures.

## 2023-01-25 NOTE — Assessment & Plan Note (Signed)
By report he had decreased heart rate with walking associated with hypoxia.  Unfortunate I do not have the the treated documentation however it did appear that his heart rate went down to the 40s.  This would suggest possible chronotropic incompetence however today when he walked in his heart rate was 97 bpm after walking and reduced down to 90 simply by time his EKG was taken.  On my exam his heart rate was even lower which would indicate that he does have symptoms of competence.  Plan: 2-week Zio patch monitor

## 2023-01-25 NOTE — Assessment & Plan Note (Signed)
Pretty significant COPD with significant dyspnea.  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  Plan: Check 2D echo for now.

## 2023-01-25 NOTE — Assessment & Plan Note (Signed)
Several EKGs in the past have shown sinus rhythm with incomplete right bundle branch block, now he has sinus rhythm with inferior Q waves in 3 and aVF.  Probably benign, but in the setting of his new bradycardia concerned and hypoxia/dyspnea, will check 2D echo just to see if there is any evidence of inferior wall motion normalities which would then trigger potential ischemic evaluation.
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
## 2023-01-25 NOTE — Assessment & Plan Note (Signed)
With hypoxia and exertional dyspnea, concern for pulm hypertension-complicating factors would be oxygen requiring COPD as well as bilateral PE.  Plan: Check 2D echo to assess RV Size, function and pressures.

## 2023-01-28 DIAGNOSIS — I2699 Other pulmonary embolism without acute cor pulmonale: Secondary | ICD-10-CM | POA: Diagnosis not present

## 2023-01-28 DIAGNOSIS — R9431 Abnormal electrocardiogram [ECG] [EKG]: Secondary | ICD-10-CM

## 2023-01-28 DIAGNOSIS — R001 Bradycardia, unspecified: Secondary | ICD-10-CM | POA: Diagnosis not present

## 2023-03-09 ENCOUNTER — Other Ambulatory Visit: Payer: Medicare Other

## 2023-03-22 ENCOUNTER — Ambulatory Visit (INDEPENDENT_AMBULATORY_CARE_PROVIDER_SITE_OTHER): Payer: Medicare Other

## 2023-03-22 ENCOUNTER — Ambulatory Visit
Admission: EM | Admit: 2023-03-22 | Discharge: 2023-03-22 | Disposition: A | Discharge status: Home / Self Care | Payer: Medicare Other | Attending: Family Medicine | Admitting: Family Medicine

## 2023-03-22 DIAGNOSIS — S2241XA Multiple fractures of ribs, right side, initial encounter for closed fracture: Secondary | ICD-10-CM

## 2023-03-22 DIAGNOSIS — W19XXXA Unspecified fall, initial encounter: Secondary | ICD-10-CM

## 2023-03-22 MED ORDER — ACETAMINOPHEN 500 MG PO TABS: 1000.0000 mg | ORAL_TABLET | Freq: Once | ORAL | Status: DC

## 2023-03-22 MED ORDER — ACETAMINOPHEN 325 MG PO TABS
975.0000 mg | ORAL_TABLET | Freq: Once | ORAL | Status: AC
  Administered 2023-03-22: 975 mg via ORAL

## 2023-03-22 NOTE — ED Provider Notes (Addendum)
[facility]    CSN: [id number] Arrival date & time: 03/22/23  1004      History   Chief Complaint Chief Complaint  Patient presents with   Fall    HPI  HPI Jorge Rosales is a 77 y.o. male.   Jorge Rosales presents after a fall yesterday morning. He was sweeping and tripped over some boxes on the floor. He is unable to get comfortable. Reports right sided rib pain. He didn't sleep last night. He hasn't taken anything for pain as he takes warfarin. He did not hit his head or lose consciousness during the fall.  Has some right-sided rib pain with movement.  Denies cough, chest pain and shortness of breath.    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  Started on Tegretol.  No further episodes.    Patient Active Problem List   Diagnosis Date Noted   Bradycardia by electrocardiogram 01/23/2023   Pulmonary hypertension, unspecified (HCC) 01/23/2023   Nonspecific abnormal electrocardiogram (ECG) (EKG) 01/23/2023   Acute pulmonary embolism (HCC) 05/16/2021   Pulmonary embolism (HCC) 05/16/2021   Pulmonary cavitary lesion 04/11/2014   Smoker 01/23/2014   Solitary pulmonary nodule 01/23/2014   COPD GOLD III 01/22/2014    Past Surgical History:  Procedure Laterality Date   APPENDECTOMY     BIL hip replacement  11/29/2011   BOWEL RESECTION  11/28/1965   INGUINAL HERNIA REPAIR Bilateral        Home Medications    Prior  to Admission medications   Medication Sig Start Date End Date Taking? Authorizing Provider  carbamazepine (TEGRETOL) 200 MG tablet Take 200 mg by mouth 3 (three) times daily.   Yes [provider]  carbamazepine (TEGRETOL) 200 MG tablet Take 1 tablet by mouth 3 (three) times daily. 09/28/22  Yes [provider]  cyanocobalamin (VITAMIN B12) 1000 MCG tablet Take 1,000 mcg by mouth daily. PRN 12/28/22 12/28/23 Yes [provider]  DULoxetine (CYMBALTA) 20 MG capsule Take 1 tablet by mouth daily. 05/03/21  Yes [provider]  TRELEGY ELLIPTA 100-62.5-25 MCG/ACT AEPB Inhale 1 puff into the lungs daily. 01/03/23  Yes [provider]  albuterol (VENTOLIN HFA) 108 (90 Base) MCG/ACT inhaler Inhale 2 puffs into the lungs every 4 (four) hours as needed. 12/01/22   [provider]  budesonide-formoterol (SYMBICORT) 160-4.5 MCG/ACT inhaler Take 2 puffs first thing in am and then another 2 puffs about 12 hours later. Patient not taking: Reported on 01/23/2023 02/26/14   Nyoka Cowden, MD  warfarin (COUMADIN) 2.5 MG tablet Take 1 tablet (2.5 mg total) by mouth daily. 05/19/21 01/23/23  Marinda Elk, MD    Family History Family History  Problem Relation Age of Onset   Heart disease Father    Lung cancer Father     Social History Social History   Tobacco Use   Smoking status: Former  Packs/day: 1.00    Years: 49.00    Additional pack years: 0.00    Total pack years: 49.00    Types: Cigarettes    Start date: 11/28/1964  Vaping Use   Vaping Use: Never used  Substance Use Topics   Alcohol use: Yes    Comment: 2 beers/day   Drug use: Never     Allergies   Patient has no known allergies.   Review of Systems Review of Systems: egative unless otherwise stated in HPI.      Physical Exam Triage Vital Signs ED Triage Vitals  Enc Vitals Group     BP 03/22/23 1122 (!) 168/95     Pulse Rate 03/22/23 1122 80     Resp --      Temp 03/22/23  1122 98.1 F (36.7 C)     Temp Source 03/22/23 1122 Oral     SpO2 03/22/23 1122 94 %     Weight 03/22/23 1120 135 lb (61.2 kg)     Height 03/22/23 1120 5\' 10"  (1.778 m)     Head Circumference --      Peak Flow --      Pain Score 03/22/23 1119 6     Pain Loc --      Pain Edu? --      Excl. in GC? --    No data found.  Updated Vital Signs BP (!) 168/95 (BP Location: Left Arm)   Pulse 80   Temp 98.1 F (36.7 C) (Oral)   Ht 5\' 10"  (1.778 m)   Wt 61.2 kg   SpO2 94%   BMI 19.37 kg/m   Visual Acuity Right Eye Distance:   Left Eye Distance:   Bilateral Distance:    Right Eye Near:   Left Eye Near:    Bilateral Near:     Physical Exam GEN: well appearing male in no acute distress  CVS: well perfused, regular rate and rhythm  RESP: speaking in full sentences without pause, no respiratory distress, clear to auscultation bilaterally MSK: spine: - Inspection: no gross deformity or asymmetry, swelling or ecchymosis. No skin changes  - Palpation: No TTP over the C, T, L spinous processes nor bilateral lumbar paraspinal muscles, no SI joint tenderness bilaterally, right lower rib cage TTP, no overlying skin changes  - ROM: full active ROM of the spine  - Strength: at baseline  - Neuro: gross  sensation intact - Special testing: Negative straight leg raise SKIN: warm, dry, no overly skin rash or erythema    UC Treatments / Results  Labs (all labs ordered are listed, but only abnormal results are displayed) Labs Reviewed - No data to display  EKG   Radiology DG Ribs Unilateral W/Chest Right  Result Date: 03/22/2023 CLINICAL DATA:  Tripped over a box striking RIGHT side on a cabinet, RIGHT lower rib pain post fall EXAM: RIGHT RIBS AND CHEST - 3+ VIEW COMPARISON:  05/15/2021 FINDINGS: Normal heart size, mediastinal contours, and pulmonary vascularity. Atherosclerotic calcification aorta. Emphysematous and bronchitic changes consistent with COPD. Extensive parenchymal scarring  in LEFT upper lobe with calcified granuloma LEFT mid lung. No acute infiltrate, pleural effusion, or pneumothorax. BILATERAL nipple shadows noted. Bones demineralized. BB placed at site of symptoms lower RIGHT ribs. Minimally displaced fracture identified at anterior aspect of RIGHT ninth rib unlikely the adjacent anterior RIGHT eighth rib. IMPRESSION: COPD changes with extensive parenchymal scarring LEFT lung. Fractures of the anterior RIGHT ninth and probably RIGHT eighth ribs. Aortic Atherosclerosis (ICD10-I70.0) and  Emphysema (ICD10-J43.9). Electronically Signed   By: Ulyses Southward M.D.   On: 03/22/2023 12:05      Procedures Procedures (including critical care time)  Medications Ordered in UC Medications  acetaminophen (TYLENOL) tablet 975 mg (975 mg Oral Given 03/22/23 1140)    Initial Impression / Assessment and Plan / UC Course  I have reviewed the triage vital signs and the nursing notes.  Pertinent labs & imaging results that were available during my care of the patient were reviewed by me and considered in my medical decision making (see chart for details).      Pt is a 77 y.o.  male on chronic anticoagulation for presented after a fall yesterday.    Satting 94% on room air.  BP 168/95.  Pulmonary exam is unremarkable.  However, he has right-sided lower rib cage tenderness to palpation.  Recommended rib series and patient is agreeable.  Xrays personally interpreted by me were for 9th rib fracture.  No pleural effusion, thorax or focal CT.  Radiologist report reviewed and notes ninth rib fracture and possibly eighth rib fracture as well.  Patient to gradually return to normal activities, as tolerated and continue ordinary activities within the limits permitted by pain. Tylenol and Lidocaine patches PRN for multimodal pain relief. Patient to follow up with primary care provider if symptoms do not improve with conservative treatment.  Unfortunately, we do not have an incentive spirometer  here.  Given prevention instructions.  Return and ED precautions given.   Discussed MDM, treatment plan and plan for follow-up with patient who agrees with plan.   Final Clinical Impressions(s) / UC Diagnoses   Final diagnoses:  Closed fracture of multiple ribs of right side, initial encounter  Fall, initial encounter     Discharge Instructions      Take two 500 mg Tylenol 3 times a day or alternatively you can also take to 650 mg twice a day.   Avoid ibuprofen like medications as you are on coumadin.      ED Prescriptions   None    I have reviewed the PDMP during this encounter.       Katha Cabal, DO 03/28/23 2247

## 2023-03-22 NOTE — ED Triage Notes (Signed)
Pt c/o Fall on 03/21/23.   Pt states that he tripped over a box and hit his right side on a cabinet.   Pt states that he feels a Mika pain when raising and moving his right arm or when taking deep breathes.   Pt denies trouble breathing  Pt is on coumadin.

## 2023-03-22 NOTE — Discharge Instructions (Addendum)
Take two 500 mg Tylenol 3 times a day or alternatively you can also take to 650 mg twice a day.   Avoid ibuprofen like medications as you are on coumadin.

## 2023-04-06 ENCOUNTER — Other Ambulatory Visit (HOSPITAL_COMMUNITY): Payer: Self-pay

## 2023-04-07 ENCOUNTER — Ambulatory Visit: Payer: Medicare Other

## 2023-05-11 ENCOUNTER — Ambulatory Visit: Payer: Medicare Other | Attending: Cardiology | Admitting: Cardiology

## 2023-05-11 ENCOUNTER — Encounter: Payer: Self-pay | Admitting: Cardiology

## 2023-05-11 NOTE — Progress Notes (Deleted)
Primary Care Provider: [facility]) [facility] Cardiologist: Bryan Lemma, MD Electrophysiologist: None  Clinic Note: No chief complaint on file.  ===================================  ASSESSMENT/PLAN   Problem List Items Addressed This Visit   None  ===================================  HPI:    Jorge Rosales is a 77 y.o. male former smoker (quit 5 to 6 years ago, 45-pack-year history) with notable for COPD, Bilateral PE (on warfarin -> in 2023), depression and weight loss who is being seen today for 37-month follow-up evaluation of Bradycardia at the request of [place] / Zada Finders, Joycie Peek, MD .  Jorge Rosales was seen initially on January 23, 2023 -> severely limited as far as activity due to COPD (not yet on full-time oxygen), bilateral PE and hip pain as well as poor balance.  Notable exertional dyspnea, stable for him.  No chest pain or pressure.  No edema.  Occasional skipped beats..  Heart rate was 97 upon walking in the clinic is oxygen saturation is 98%. 2D echo to assess RV size and function with COPD and PEs 14-day Zio patch  Recent Hospitalizations:  03/22/2023: Urgent Care Evaluation -> suffered a fall while sweeping.  Tripped over some boxes and fell.  Right-sided rib pain.  Found to have multiple rib fractures   Reviewed  CV studies:    The following studies were reviewed today: (if available, images/films reviewed: From Epic Chart or Care Everywhere) TTE 05/16/2021: EF 60 to 65%.  Normal LV size and function.  No RWMA.  GR 1 DD.  Normal RV.  Mild aortic valve sclerosis/thickening/calcification but no stenosis/narrowing.  Normal pressures. Follow-up echo not done 7-day Zio patch monitor: March 2024   Predominant Rhythm Sinus: HR range 53-132 bpm, avg 78 bpm   Frequent (1.7%) premature atrial contractions (PACs), occasional (1.5%) PAC couplets with rare triplets. => Symptoms were noted with PACs.   Rare  (<1%)premature ventricular contractions (PVCs)   390 atrial runs ranging from 6-14 beats.  Fastest was 6 beats (2.6 sec), HR range 128-184 bpm, avg 157 bpm.  Longest 14 beats (7.5 sec), HR range 84-138 bpm, avg 113 bpm. ->  Neither of these were mentioned as triggered and diary.   No Sustained Arrhythmias: Atrial Tachycardia (AT), Supraventricular Tachycardia (SVT), Atrial Fibrillation (A-Fib), Atrial Flutter (A-Flutter), Sustained Ventricular Tachycardia (VT).  Patient noted PACs but none of the atrial runs.  Interval History:   Jorge Rosales returns today   CV Review of Symptoms (Summary): positive for - dyspnea on exertion, orthopnea, and shortness of breath negative for - chest pain, edema, irregular heartbeat, palpitations, paroxysmal nocturnal dyspnea, rapid heart rate, or syncope or near syncope, TIA or amaurosis fugax, claudication  REVIEWED OF SYSTEMS   Review of Systems  Constitutional:  Positive for malaise/fatigue (Pretty sedentary) and weight loss (Over the last several years as noted.  Decreased p.o. intake).  HENT:  Negative for congestion.   Respiratory:  Positive for cough, shortness of breath and wheezing. Negative for sputum production.        Baseline  Gastrointestinal:  Negative for abdominal pain, blood in stool, constipation and melena.  Musculoskeletal:  Positive for joint pain (Hip pain). Negative for falls.  Neurological:  Positive for weakness (Legs are weak). Negative for dizziness (Poor balance.  Vestibular issues.), sensory change, speech change, focal weakness, seizures and headaches.  Psychiatric/Behavioral:  Negative for depression and memory loss. The patient is not nervous/anxious and does not have insomnia.     I have reviewed  and (if needed) personally updated the patient's problem list, medications, allergies, past medical and surgical history, social and family history.   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  Started on Tegretol.  No further episodes.   PAST SURGICAL HISTORY   Past Surgical History:  Procedure Laterality Date   APPENDECTOMY     BIL hip replacement  11/29/2011   BOWEL RESECTION  11/28/1965   INGUINAL HERNIA REPAIR Bilateral    MEDICATIONS/ALLERGIES   Current Meds  Medication Sig   albuterol (VENTOLIN HFA) 108 (90 Base) MCG/ACT inhaler Inhale 2 puffs into the lungs every 4 (four) hours as needed.   carbamazepine (TEGRETOL) 200 MG tablet Take 200 mg by mouth 3 (three) times daily.   carbamazepine (TEGRETOL) 200 MG tablet Take 1 tablet by mouth 3 (three) times daily.   cyanocobalamin (VITAMIN B12) 1000 MCG tablet Take 1,000 mcg by mouth daily. PRN   DULoxetine (CYMBALTA) 20 MG capsule Take 1 tablet by mouth daily.   enoxaparin (LOVENOX) 100 MG/ML injection Inject 1 mL (100 mg total) into the skin daily.   TRELEGY ELLIPTA 100-62.5-25 MCG/ACT AEPB Inhale 1 puff into the lungs daily.   warfarin (COUMADIN) 2.5 MG tablet Take 1 tablet (2.5 mg total) by mouth daily.    No Known Allergies  SOCIAL HISTORY/FAMILY HISTORY   Reviewed in Epic:   Social History   Tobacco Use   Smoking status: Former    Packs/day: 1.00    Years: 49.00    Additional pack years: 0.00    Total pack years: 49.00    Types: Cigarettes    Start date: 11/28/1964  Vaping Use   Vaping Use: Never used  Substance Use Topics   Alcohol use: Yes    Comment: 2 beers/day    Drug use: Never   Social History   Social History Narrative   Diet:    Breakfast: Cereal, muffins, toast, eggs, bacon, 2 cups coffee   Late Lunch: Cheese and Bologna, Cheese and Ham   Dinner: Beef stew in the crock pot, Eats out a lot - [employer], Risk analyst: 3 glasses a day, sweet tea, coffee      Social: Engineer, water, Ride around, Animator, Read      Sleep: Sleeps in recliner at least 6 - 8 hrs a night - Does not use oxygen at night    Family History  Problem Relation Age of Onset   Heart disease Father    Lung cancer Father   Father had CAD diagnosed at age 26  OBJCTIVE -PE, EKG, labs   Wt Readings from Last 3 Encounters:  03/22/23 135 lb (61.2 kg)  01/23/23 127 lb (57.6 kg)  05/16/21 133 lb 13.1 oz (60.7 kg)    Physical Exam: There were no vitals taken for this visit. Physical Exam Vitals reviewed.  Constitutional:  General: He is not in acute distress.    Appearance: He is ill-appearing (Has a chronic ill appearance.  Thin and frail.). He is not toxic-appearing.     Comments: Somewhat thin, frail gentleman.  Mildly disheveled  HENT:     Head: Normocephalic and atraumatic.  Neck:     Vascular: No carotid bruit.  Cardiovascular:     Rate and Rhythm: Normal rate and regular rhythm.     Pulses: Normal pulses.     Heart sounds: Normal heart sounds. No murmur heard.    No friction rub. No gallop.  Pulmonary:     Effort: Pulmonary effort is normal. No respiratory distress.     Breath sounds: No wheezing, rhonchi or rales.     Comments: Distant breath sounds Chest:     Chest wall: No tenderness (Very Wach muscle mass on the chest.;  Hyperdynamic precordium).  Abdominal:     General: Abdomen is flat. There is no distension.     Palpations: Abdomen is soft. There is no mass (No HSM or bruit).     Tenderness: There is no abdominal tenderness.     Hernia: No hernia is present.  Musculoskeletal:        General: No swelling.     Cervical back: Normal  range of motion and neck supple.  Skin:    General: Skin is warm and dry.  Neurological:     General: No focal deficit present.     Mental Status: He is alert and oriented to person, place, and time.     Gait: Gait abnormal.  Psychiatric:        Mood and Affect: Mood normal.        Behavior: Behavior normal.     Adult ECG Report  Rate: 97;  Rhythm: normal sinus rhythm and cannot exclude inferior infarct, age-indeterminate.-Q waves with T wave versions in leads III and aVF ;   Narrative Interpretation: When compared to previous EKG, IRBBB no longer present and inferior Q waves with T wave inversions now present.  Recent Labs: No labs available No results found for: "CHOL", "HDL", "LDLCALC", "LDLDIRECT", "TRIG", "CHOLHDL" Lab Results  Component Value Date   CREATININE 0.70 05/17/2021   BUN 9 05/17/2021   NA 136 05/17/2021   K 4.3 05/17/2021   CL 101 05/17/2021   CO2 28 05/17/2021      Latest Ref Rng & Units 05/17/2021    2:50 AM 05/15/2021    7:46 PM  CBC  WBC 4.0 - 10.5 K/uL 13.7  22.7   Hemoglobin 13.0 - 17.0 g/dL 16.1  09.6   Hematocrit 39.0 - 52.0 % 40.6  47.4   Platelets 150 - 400 K/uL 324  373     No results found for: "HGBA1C" No results found for: "TSH"  ================================================== I spent a total of 25 minutes with the patient spent in direct patient consultation.  Additional time spent with chart review  / charting (studies, outside notes, etc): 46 min --> Extensive chart review, creating history in epic. Total Time: 71 min  Current medicines are reviewed at length with the patient today.  (+/- concerns) n/a  Notice: This dictation was prepared with Dragon dictation along with smart phrase technology. Any transcriptional errors that result from this process are unintentional and may not be corrected upon review.   Studies Ordered:  No orders of the defined types were placed in this encounter.  No orders of the defined types were  placed in this encounter.  Patient Instructions / Medication Changes & Studies & Tests Ordered   There are no Patient Instructions on file for this visit.    Marykay Lex, MD, MS Bryan Lemma, M.D., M.S. Interventional Cardiologist  [facility]  Pager # [phone] Phone # [phone] [address] [place], [place] [address]   Thank you for choosing [facility] at [place]!!

## 2023-05-16 ENCOUNTER — Ambulatory Visit: Payer: Medicare Other | Attending: Cardiology

## 2023-05-16 DIAGNOSIS — I272 Pulmonary hypertension, unspecified: Secondary | ICD-10-CM | POA: Diagnosis not present

## 2023-05-16 DIAGNOSIS — R001 Bradycardia, unspecified: Secondary | ICD-10-CM | POA: Diagnosis not present

## 2023-05-16 DIAGNOSIS — I2694 Multiple subsegmental pulmonary emboli without acute cor pulmonale: Secondary | ICD-10-CM

## 2023-05-16 DIAGNOSIS — R9431 Abnormal electrocardiogram [ECG] [EKG]: Secondary | ICD-10-CM

## 2023-05-16 LAB — ECHOCARDIOGRAM COMPLETE
AR max vel: 3.29 cm2
AV Area VTI: 3.17 cm2
AV Area mean vel: 3.07 cm2
AV Mean grad: 3 mmHg
AV Peak grad: 5.3 mmHg
Ao pk vel: 1.15 m/s
Area-P 1/2: 3.48 cm2
Calc EF: 55.7 %
S' Lateral: 2.8 cm
Single Plane A2C EF: 56.6 %
Single Plane A4C EF: 56.7 %

## 2023-06-17 ENCOUNTER — Emergency Department: Payer: Medicare Other

## 2023-06-17 ENCOUNTER — Other Ambulatory Visit: Payer: Self-pay

## 2023-06-17 ENCOUNTER — Inpatient Hospital Stay
Admission: EM | Admit: 2023-06-17 | Discharge: 2023-06-23 | DRG: 871 | Disposition: A | Discharge status: Skilled Nursing Facility | Payer: Medicare Other | Attending: Internal Medicine | Admitting: Internal Medicine

## 2023-06-17 DIAGNOSIS — E876 Hypokalemia: Secondary | ICD-10-CM | POA: Diagnosis present

## 2023-06-17 DIAGNOSIS — E860 Dehydration: Secondary | ICD-10-CM | POA: Diagnosis present

## 2023-06-17 DIAGNOSIS — J432 Centrilobular emphysema: Secondary | ICD-10-CM | POA: Diagnosis present

## 2023-06-17 DIAGNOSIS — D689 Coagulation defect, unspecified: Secondary | ICD-10-CM | POA: Diagnosis present

## 2023-06-17 DIAGNOSIS — R6520 Severe sepsis without septic shock: Secondary | ICD-10-CM | POA: Diagnosis present

## 2023-06-17 DIAGNOSIS — E871 Hypo-osmolality and hyponatremia: Secondary | ICD-10-CM | POA: Diagnosis present

## 2023-06-17 DIAGNOSIS — J8410 Pulmonary fibrosis, unspecified: Secondary | ICD-10-CM | POA: Diagnosis present

## 2023-06-17 DIAGNOSIS — Z7951 Long term (current) use of inhaled steroids: Secondary | ICD-10-CM

## 2023-06-17 DIAGNOSIS — G8929 Other chronic pain: Secondary | ICD-10-CM | POA: Diagnosis present

## 2023-06-17 DIAGNOSIS — T380X5A Adverse effect of glucocorticoids and synthetic analogues, initial encounter: Secondary | ICD-10-CM | POA: Diagnosis present

## 2023-06-17 DIAGNOSIS — E43 Unspecified severe protein-calorie malnutrition: Secondary | ICD-10-CM | POA: Diagnosis present

## 2023-06-17 DIAGNOSIS — R5381 Other malaise: Secondary | ICD-10-CM | POA: Insufficient documentation

## 2023-06-17 DIAGNOSIS — M170 Bilateral primary osteoarthritis of knee: Secondary | ICD-10-CM | POA: Diagnosis present

## 2023-06-17 DIAGNOSIS — J9621 Acute and chronic respiratory failure with hypoxia: Secondary | ICD-10-CM | POA: Diagnosis present

## 2023-06-17 DIAGNOSIS — E861 Hypovolemia: Secondary | ICD-10-CM | POA: Diagnosis present

## 2023-06-17 DIAGNOSIS — Z86718 Personal history of other venous thrombosis and embolism: Secondary | ICD-10-CM

## 2023-06-17 DIAGNOSIS — J438 Other emphysema: Secondary | ICD-10-CM | POA: Diagnosis present

## 2023-06-17 DIAGNOSIS — Z8249 Family history of ischemic heart disease and other diseases of the circulatory system: Secondary | ICD-10-CM

## 2023-06-17 DIAGNOSIS — R042 Hemoptysis: Secondary | ICD-10-CM | POA: Diagnosis present

## 2023-06-17 DIAGNOSIS — A419 Sepsis, unspecified organism: Principal | ICD-10-CM | POA: Diagnosis present

## 2023-06-17 DIAGNOSIS — J449 Chronic obstructive pulmonary disease, unspecified: Secondary | ICD-10-CM | POA: Diagnosis not present

## 2023-06-17 DIAGNOSIS — Z86711 Personal history of pulmonary embolism: Secondary | ICD-10-CM | POA: Diagnosis not present

## 2023-06-17 DIAGNOSIS — R791 Abnormal coagulation profile: Secondary | ICD-10-CM | POA: Diagnosis not present

## 2023-06-17 DIAGNOSIS — J440 Chronic obstructive pulmonary disease with acute lower respiratory infection: Secondary | ICD-10-CM | POA: Diagnosis present

## 2023-06-17 DIAGNOSIS — Z7901 Long term (current) use of anticoagulants: Secondary | ICD-10-CM

## 2023-06-17 DIAGNOSIS — Z96649 Presence of unspecified artificial hip joint: Secondary | ICD-10-CM | POA: Diagnosis present

## 2023-06-17 DIAGNOSIS — Z801 Family history of malignant neoplasm of trachea, bronchus and lung: Secondary | ICD-10-CM

## 2023-06-17 DIAGNOSIS — R64 Cachexia: Secondary | ICD-10-CM | POA: Diagnosis present

## 2023-06-17 DIAGNOSIS — G40909 Epilepsy, unspecified, not intractable, without status epilepticus: Secondary | ICD-10-CM | POA: Diagnosis present

## 2023-06-17 DIAGNOSIS — Z91040 Latex allergy status: Secondary | ICD-10-CM

## 2023-06-17 DIAGNOSIS — Z87891 Personal history of nicotine dependence: Secondary | ICD-10-CM

## 2023-06-17 DIAGNOSIS — J189 Pneumonia, unspecified organism: Secondary | ICD-10-CM | POA: Diagnosis present

## 2023-06-17 DIAGNOSIS — Z79899 Other long term (current) drug therapy: Secondary | ICD-10-CM

## 2023-06-17 DIAGNOSIS — Z87898 Personal history of other specified conditions: Secondary | ICD-10-CM | POA: Diagnosis not present

## 2023-06-17 DIAGNOSIS — Z681 Body mass index (BMI) 19 or less, adult: Secondary | ICD-10-CM | POA: Diagnosis not present

## 2023-06-17 DIAGNOSIS — I272 Pulmonary hypertension, unspecified: Secondary | ICD-10-CM | POA: Diagnosis present

## 2023-06-17 DIAGNOSIS — Z9981 Dependence on supplemental oxygen: Secondary | ICD-10-CM | POA: Diagnosis not present

## 2023-06-17 DIAGNOSIS — Z1152 Encounter for screening for COVID-19: Secondary | ICD-10-CM | POA: Diagnosis not present

## 2023-06-17 DIAGNOSIS — Z85828 Personal history of other malignant neoplasm of skin: Secondary | ICD-10-CM

## 2023-06-17 LAB — CBC
HCT: 40.9 % (ref 39.0–52.0)
Hemoglobin: 14 g/dL (ref 13.0–17.0)
MCH: 31.5 pg (ref 26.0–34.0)
MCHC: 34.2 g/dL (ref 30.0–36.0)
MCV: 92.1 fL (ref 80.0–100.0)
Platelets: 566 10*3/uL — ABNORMAL HIGH (ref 150–400)
RBC: 4.44 MIL/uL (ref 4.22–5.81)
RDW: 12.6 % (ref 11.5–15.5)
WBC: 32.4 10*3/uL — ABNORMAL HIGH (ref 4.0–10.5)
nRBC: 0 % (ref 0.0–0.2)

## 2023-06-17 LAB — LACTIC ACID, PLASMA: Lactic Acid, Venous: 1.4 mmol/L (ref 0.5–1.9)

## 2023-06-17 LAB — COMPREHENSIVE METABOLIC PANEL
ALT: 15 U/L (ref 0–44)
AST: 29 U/L (ref 15–41)
Albumin: 2.7 g/dL — ABNORMAL LOW (ref 3.5–5.0)
Alkaline Phosphatase: 138 U/L — ABNORMAL HIGH (ref 38–126)
Anion gap: 11 (ref 5–15)
BUN: 31 mg/dL — ABNORMAL HIGH (ref 8–23)
CO2: 26 mmol/L (ref 22–32)
Calcium: 8.1 mg/dL — ABNORMAL LOW (ref 8.9–10.3)
Chloride: 91 mmol/L — ABNORMAL LOW (ref 98–111)
Creatinine, Ser: 0.91 mg/dL (ref 0.61–1.24)
GFR, Estimated: >60 mL/min (ref >60)
Glucose, Bld: 170 mg/dL — ABNORMAL HIGH (ref 70–99)
Potassium: 3.7 mmol/L (ref 3.5–5.1)
Sodium: 128 mmol/L — ABNORMAL LOW (ref 135–145)
Total Bilirubin: 0.6 mg/dL (ref 0.3–1.2)
Total Protein: 7.3 g/dL (ref 6.5–8.1)

## 2023-06-17 LAB — TROPONIN I (HIGH SENSITIVITY)
Troponin I (High Sensitivity): 17 ng/L (ref <18)
Troponin I (High Sensitivity): 11 ng/L (ref <18)

## 2023-06-17 LAB — PROTIME-INR
INR: 12.2 (ref 0.8–1.2)
Prothrombin Time: 92.9 seconds — ABNORMAL HIGH (ref 11.4–15.2)

## 2023-06-17 MED ORDER — SODIUM CHLORIDE 0.9 % IV BOLUS (SEPSIS)
1000.0000 mL | Freq: Once | INTRAVENOUS | Status: AC
  Administered 2023-06-17: 1000 mL via INTRAVENOUS

## 2023-06-17 MED ORDER — SODIUM CHLORIDE 0.9 % IV SOLN
500.0000 mg | INTRAVENOUS | Status: DC
  Administered 2023-06-17: 500 mg via INTRAVENOUS
  Filled 2023-06-17: qty 5

## 2023-06-17 MED ORDER — ONDANSETRON HCL 4 MG PO TABS: 4.0000 mg | ORAL_TABLET | Freq: Four times a day (QID) | ORAL | Status: DC | PRN

## 2023-06-17 MED ORDER — CARBAMAZEPINE 200 MG PO TABS
200.0000 mg | ORAL_TABLET | Freq: Three times a day (TID) | ORAL | Status: DC
  Administered 2023-06-18 – 2023-06-23 (×17): 200 mg via ORAL
  Filled 2023-06-17 (×17): qty 1

## 2023-06-17 MED ORDER — ACETAMINOPHEN 650 MG RE SUPP: 650.0000 mg | Freq: Four times a day (QID) | RECTAL | Status: DC | PRN

## 2023-06-17 MED ORDER — LACTATED RINGERS IV SOLN
150.0000 mL/h | INTRAVENOUS | Status: DC
  Administered 2023-06-18: 150 mL/h via INTRAVENOUS

## 2023-06-17 MED ORDER — UMECLIDINIUM BROMIDE 62.5 MCG/ACT IN AEPB
1.0000 | INHALATION_SPRAY | Freq: Every day | RESPIRATORY_TRACT | Status: DC
  Administered 2023-06-18: 1 via RESPIRATORY_TRACT
  Filled 2023-06-17: qty 7

## 2023-06-17 MED ORDER — DULOXETINE HCL 20 MG PO CPEP: 20.0000 mg | ORAL_CAPSULE | Freq: Every day | ORAL | Status: DC

## 2023-06-17 MED ORDER — FLUTICASONE FUROATE-VILANTEROL 100-25 MCG/ACT IN AEPB
1.0000 | INHALATION_SPRAY | Freq: Every day | RESPIRATORY_TRACT | Status: DC
  Administered 2023-06-18 – 2023-06-23 (×6): 1 via RESPIRATORY_TRACT
  Filled 2023-06-17 (×2): qty 28

## 2023-06-17 MED ORDER — LACTATED RINGERS IV SOLN: INTRAVENOUS | Status: DC

## 2023-06-17 MED ORDER — SODIUM CHLORIDE 0.9 % IV SOLN: 2.0000 g | INTRAVENOUS | Status: DC

## 2023-06-17 MED ORDER — ACETAMINOPHEN 325 MG PO TABS: 650.0000 mg | ORAL_TABLET | Freq: Four times a day (QID) | ORAL | Status: DC | PRN

## 2023-06-17 MED ORDER — ONDANSETRON HCL 4 MG/2ML IJ SOLN: 4.0000 mg | Freq: Four times a day (QID) | INTRAMUSCULAR | Status: DC | PRN

## 2023-06-17 MED ORDER — SODIUM CHLORIDE 0.9 % IV SOLN: 500.0000 mg | INTRAVENOUS | Status: DC

## 2023-06-17 MED ORDER — SODIUM CHLORIDE 0.9 % IV SOLN
2.0000 g | INTRAVENOUS | Status: DC
  Administered 2023-06-17: 2 g via INTRAVENOUS
  Filled 2023-06-17: qty 20

## 2023-06-17 MED ORDER — IOHEXOL 350 MG/ML SOLN
75.0000 mL | Freq: Once | INTRAVENOUS | Status: AC | PRN
  Administered 2023-06-17: 75 mL via INTRAVENOUS

## 2023-06-17 NOTE — Assessment & Plan Note (Addendum)
Sepsis Patient tachycardic and tachypneic with leukocytosis to 32,000, lactic acid pending--->1.4>2.1 Sepsis fluid bolus and maintenance Continue Rocephin and azithromycin DuoNebs as needed Supplemental oxygen Antitussives Follow cultures Will get respiratory viral panel

## 2023-06-17 NOTE — Sepsis Progress Note (Signed)
Elink following for Sepsis Protocol 

## 2023-06-17 NOTE — Consult Note (Incomplete)
CODE SEPSIS - PHARMACY COMMUNICATION  **Broad Spectrum Antibiotics should be administered within 1 hour of Sepsis diagnosis**  Time Code Sepsis Called/Page Received: 2133  Antibiotics Ordered: azithromycin, ceftiaxone  Time of 1st antibiotic administration: ***  Additional action taken by pharmacy: ***  If necessary, Name of Provider/Nurse Contacted: ***  Celene Squibb, PharmD Clinical Pharmacist 06/17/2023 9:40 PM

## 2023-06-17 NOTE — Assessment & Plan Note (Addendum)
Warfarin anticoagulation with supratherapeutic INR CTA negative for PE Holding INR due to supratherapeutic value of 12-no active bleeding Pharmacy consult for Coumadin management

## 2023-06-17 NOTE — ED Provider Notes (Signed)
[facility] Provider Note   Event Date/Time   First MD Initiated Contact with Patient 06/17/23 2056     (approximate) History  Shortness of Breath (Since Tuesday. )  HPI Jorge Rosales is a 77 y.o. male with a stated past medical history of PE and COPD on 2 L chronically who presents complaining of worsening shortness of breath over the last 5 days.  Patient states that he also feels very dehydrated, lightheaded.  Patient states that he increased his Lasix thinking that this was a cause for his shortness of breath however his symptoms have not improved.  Patient is had increased his oxygen from 2 to 4 L ROS: Patient currently denies any vision changes, tinnitus, difficulty speaking, facial droop, sore throat, chest pain, shortness of breath, abdominal pain, nausea/vomiting/diarrhea, dysuria, or weakness/numbness/paresthesias in any extremity   Physical Exam  Triage Vital Signs: ED Triage Vitals [06/17/23 1928]  Encounter Vitals Group     BP 134/82     Systolic BP Percentile      Diastolic BP Percentile      Pulse Rate (!) 118     Resp 16     Temp 98.4 F (36.9 C)     Temp Source Oral     SpO2 97 %     Weight      Height 5\' 10"  (1.778 m)     Head Circumference      Peak Flow      Pain Score 0     Pain Loc      Pain Education      Exclude from Growth Chart    Most recent vital signs: Vitals:   06/17/23 1936 06/17/23 2111  BP:    Pulse:  (!) 103  Resp: (!) 34   Temp:  98.2 F (36.8 C)  SpO2:  100%   General: Awake, oriented x4. CV:  Good peripheral perfusion.  Resp:  Normal effort.  Abd:  No distention.  Other:  Elderly well-developed, well-nourished Caucasian male laying in bed in no acute distress.  2 L nasal cannula in place ED Results / Procedures / Treatments  Labs (all labs ordered are listed, but only abnormal results are displayed) Labs Reviewed  CBC - Abnormal; Notable for the following components:      Result Value   WBC 32.4 (*)     Platelets 566 (*)    All other components within normal limits  COMPREHENSIVE METABOLIC PANEL - Abnormal; Notable for the following components:   Sodium 128 (*)    Chloride 91 (*)    Glucose, Bld 170 (*)    BUN 31 (*)    Calcium 8.1 (*)    Albumin 2.7 (*)    Alkaline Phosphatase 138 (*)    All other components within normal limits  PROTIME-INR - Abnormal; Notable for the following components:   Prothrombin Time 92.9 (*)    INR 12.2 (*)    All other components within normal limits  CULTURE, BLOOD (ROUTINE X 2)  CULTURE, BLOOD (ROUTINE X 2)  SARS CORONAVIRUS 2 BY RT PCR  LACTIC ACID, PLASMA  LACTIC ACID, PLASMA  PROCALCITONIN  TROPONIN I (HIGH SENSITIVITY)  TROPONIN I (HIGH SENSITIVITY)   EKG ED ECG REPORT I, Merwyn Katos, the attending physician, personally viewed and interpreted this ECG. Date: 06/17/2023 EKG Time: 1930 Rate: 117 Rhythm: Tachycardic sinus rhythm QRS Axis: normal Intervals: normal ST/T Wave abnormalities: normal Narrative Interpretation: Tachycardic sinus rhythm no evidence of acute ischemia  RADIOLOGY ED MD interpretation: CT angiography of the chest interpreted independently by me and shows no evidence of pulmonary emboli with a new upper lobe pneumonia superimposed over more chronic changes seen on prior exams.  2 view chest x-ray the interpreted independently by me and shows left upper lobe pneumonia -Agree with radiology assessment Official radiology report(s): CT Angio Chest Pulmonary Embolism (PE) W or WO Contrast  Result Date: 06/17/2023 CLINICAL DATA:  Shortness of breath for several days, initial encounter EXAM: CT ANGIOGRAPHY CHEST WITH CONTRAST TECHNIQUE: Multidetector CT imaging of the chest was performed using the standard protocol during bolus administration of intravenous contrast. Multiplanar CT image reconstructions and MIPs were obtained to evaluate the vascular anatomy. RADIATION DOSE REDUCTION: This exam was performed according to  the departmental dose-optimization program which includes automated exposure control, adjustment of the mA and/or kV according to patient size and/or use of iterative reconstruction technique. CONTRAST:  75mL OMNIPAQUE IOHEXOL 350 MG/ML SOLN COMPARISON:  Plain film from earlier in the same day. FINDINGS: Cardiovascular: Atherosclerotic calcifications of the thoracic aorta are noted. No aneurysmal dilatation or dissection is seen. Heart is not significantly enlarged in size. Pulmonary artery shows a normal branching pattern bilaterally with the exception of the upper lobe on the left which demonstrates some attenuation related to the underlying scarring. No filling defects to suggest pulmonary emboli are noted. Mediastinum/Nodes: Thoracic inlet is within normal limits. Scattered small mediastinal lymph nodes are noted likely reactive in nature. Calcified nodes are noted within the mediastinum and left hilum similar to that seen on prior exam. The esophagus as visualized is within normal limits. Lungs/Pleura: Right lung demonstrates diffuse emphysematous changes. No focal infiltrate or effusion is seen. Some scarring in the posterolateral right upper lobe is seen stable from the prior exam. The left lower lobe demonstrates emphysematous changes. Calcified granuloma is noted within the lingula. Extensive acute infiltrate is noted within the left upper lobe superimposed over more chronic areas of scarring. No sizable effusion is seen. Upper Abdomen: Visualized upper abdomen is within normal limits. Musculoskeletal: Degenerative changes of the thoracic spine are noted. Review of the MIP images confirms the above findings. IMPRESSION: No evidence of pulmonary emboli. New left upper lobe pneumonia superimposed over more chronic changes seen on prior exams. Changes consistent with prior granulomatous disease. Aortic Atherosclerosis (ICD10-I70.0) and Emphysema (ICD10-J43.9). Electronically Signed   By: Alcide Clever M.D.    On: 06/17/2023 20:57   DG Chest 2 View  Result Date: 06/17/2023 CLINICAL DATA:  Shortness of breath EXAM: CHEST - 2 VIEW COMPARISON:  03/22/2023 FINDINGS: Cardiac shadow is within normal limits. Lungs are well aerated bilaterally. Calcified granuloma is again noted in the left mid lung. Considerable increased airspace opacity is noted in the left upper lobe new from the prior exam. This is superimposed over areas of previous scarring in the left upper lobe. Degenerative changes of the thoracic spine are noted. IMPRESSION: Left upper lobe pneumonia. Electronically Signed   By: Alcide Clever M.D.   On: 06/17/2023 20:17   PROCEDURES: Critical Care performed: Yes, see critical care procedure note(s) .1-3 Lead EKG Interpretation  Performed by: Merwyn Katos, MD Authorized by: Merwyn Katos, MD     Interpretation: abnormal     ECG rate:  105   ECG rate assessment: tachycardic     Rhythm: sinus tachycardia     Ectopy: none     Conduction: normal   CRITICAL CARE Performed by: Merwyn Katos  Total critical care time:  33 minutes  Critical care time was exclusive of separately billable procedures and treating other patients.  Critical care was necessary to treat or prevent imminent or life-threatening deterioration.  Critical care was time spent personally by me on the following activities: development of treatment plan with patient and/or surrogate as well as nursing, discussions with consultants, evaluation of patient's response to treatment, examination of patient, obtaining history from patient or surrogate, ordering and performing treatments and interventions, ordering and review of laboratory studies, ordering and review of radiographic studies, pulse oximetry and re-evaluation of patient's condition.  MEDICATIONS ORDERED IN ED: Medications  lactated ringers infusion (0 mLs Intravenous Hold 06/17/23 2247)  DULoxetine (CYMBALTA) DR capsule 20 mg (has no administration in time range)   carbamazepine (TEGRETOL) tablet 200 mg (has no administration in time range)  fluticasone furoate-vilanterol (BREO ELLIPTA) 100-25 MCG/ACT 1 puff (has no administration in time range)    And  umeclidinium bromide (INCRUSE ELLIPTA) 62.5 MCG/ACT 1 puff (has no administration in time range)  lactated ringers infusion (has no administration in time range)  cefTRIAXone (ROCEPHIN) 2 g in sodium chloride 0.9 % 100 mL IVPB (has no administration in time range)  azithromycin (ZITHROMAX) 500 mg in sodium chloride 0.9 % 250 mL IVPB (has no administration in time range)  acetaminophen (TYLENOL) tablet 650 mg (has no administration in time range)    Or  acetaminophen (TYLENOL) suppository 650 mg (has no administration in time range)  ondansetron (ZOFRAN) tablet 4 mg (has no administration in time range)    Or  ondansetron (ZOFRAN) injection 4 mg (has no administration in time range)  iohexol (OMNIPAQUE) 350 MG/ML injection 75 mL (75 mLs Intravenous Contrast Given 06/17/23 2030)  sodium chloride 0.9 % bolus 1,000 mL (0 mLs Intravenous Stopped 06/17/23 2317)    And  sodium chloride 0.9 % bolus 1,000 mL (0 mLs Intravenous Stopped 06/17/23 2317)   IMPRESSION / MDM / ASSESSMENT AND PLAN / ED COURSE  I reviewed the triage vital signs and the nursing notes.                             The patient is on the cardiac monitor to evaluate for evidence of arrhythmia and/or significant heart rate changes. Patient's presentation is most consistent with acute presentation with potential threat to life or bodily function. Presents with shortness of breath, cough, and malaise concerning for pneumonia.  DDx: PE, COPD exacerbation, Pneumothorax, TB, Atypical ACS, Esophageal Rupture, Toxic Exposure, Foreign Body Airway Obstruction.  Workup: CXR CBC, CMP, lactate, troponin  Given History, Exam, and Workup presentation most consistent with pneumonia.  Findings: Left upper lobe pneumonia  Tx: Ceftriaxone 1g  IV Azithromycin 500mg  IV  2030Reassessment: As patient is continuing to require increased supplemental oxygenation for acute hypoxic respiratory failure, patient will require admission to the internal medicine service for further evaluation and management  Disposition: Admit   FINAL CLINICAL IMPRESSION(S) / ED DIAGNOSES   Final diagnoses:  Pneumonia of left upper lobe due to infectious organism  Acute on chronic hypoxic respiratory failure (HCC)   Rx / DC Orders   ED Discharge Orders     None      Note:  This document was prepared using Dragon voice recognition software and may include unintentional dictation errors.   Merwyn Katos, MD 06/17/23 [phone]

## 2023-06-17 NOTE — H&P (Incomplete)
History and Physical    Patient: Jorge Rosales UXL:[id number] DOB: Jul 10, 1946 DOA: 06/17/2023 DOS: the patient was seen and examined on 06/17/2023 PCP: [facility]  Patient coming from: Home  Chief Complaint:  Chief Complaint  Patient presents with   Shortness of Breath    Since Tuesday.     HPI: Jorge Rosales is a 77 y.o. male with medical history significant for COPD on home O2 at 2 L, former smoker, pulmonary hypertension, PE in 2022 on warfarin anticoagulation, who presents to the ED with a 4-day history of shortness of breath and cough.  Denies fever, chills or chest pain ED course and data review: Tachycardic to 118 and tachypneic to 34 with otherwise normal vitals.  Labs significant for WBC of 32,000, lactic acid pending.  Sodium 128 EKG, Personally viewed and interpreted showing sinus tachycardia at 117 with no ischemic ST-T wave changes CT angio of the chest showing pneumonia as follows: New left upper lobe pneumonia superimposed over more chronic changes seen on prior exams. Patient given fluid bolus started on Rocephin and azithromycin and hospitalist consulted for admission.   Review of Systems: As mentioned in the history of present illness. All other systems reviewed and are negative.  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  Started on Tegretol.  No further episodes.   Past Surgical History:  Procedure  Laterality Date   APPENDECTOMY     BIL hip replacement  11/29/2011   BOWEL RESECTION  11/28/1965   INGUINAL HERNIA REPAIR Bilateral    Social History:  reports that he has quit smoking. His smoking use included cigarettes. He started smoking about 58 years ago. He has a 58.5 pack-year smoking history. He does not have any smokeless tobacco history on file. He reports current alcohol use. He reports that he does not use drugs.  No Known Allergies  Family History  Problem Relation Age of Onset   Heart disease Father    Lung cancer Father     Prior to Admission medications   Medication Sig Start Date End Date Taking? Authorizing Provider  albuterol (VENTOLIN HFA) 108 (90 Base) MCG/ACT inhaler Inhale 2 puffs into the lungs every 4 (four) hours as needed. 12/01/22   [provider]  budesonide-formoterol (SYMBICORT) 160-4.5 MCG/ACT inhaler Take 2 puffs first thing in am and then another 2 puffs about 12 hours later. Patient not taking: Reported on 01/23/2023 02/26/14   Nyoka Cowden, MD  carbamazepine (TEGRETOL) 200 MG tablet Take 200 mg by mouth 3 (three) times daily.    [provider]  carbamazepine (TEGRETOL) 200 MG tablet Take 1 tablet by mouth 3 (three) times daily. 09/28/22   [provider]  cyanocobalamin (VITAMIN B12) 1000 MCG tablet Take 1,000 mcg by mouth daily. PRN 12/28/22 12/28/23  [provider]  DULoxetine (CYMBALTA) 20 MG capsule Take 1 tablet by mouth daily. 05/03/21  [provider]  TRELEGY ELLIPTA 100-62.5-25 MCG/ACT AEPB Inhale 1 puff into the lungs daily. 01/03/23   [provider]  warfarin (COUMADIN) 2.5 MG tablet Take 1 tablet (2.5 mg total) by mouth daily. 05/19/21 01/23/23  Marinda Elk, MD    Physical Exam: Vitals:   06/17/23 1928 06/17/23 1936 06/17/23 2111  BP: 134/82    Pulse: (!) 118  (!) 103  Resp: 16 (!) 34   Temp: 98.4 F (36.9 C)  98.2 F (36.8 C)  TempSrc: Oral  Oral  SpO2: 97%  100%  Height:  5\' 10"  (1.778 m)     Physical Exam Vitals and nursing note reviewed.  Constitutional:      General: He is not in acute distress.    Comments: Frail appearing male sitting with head of bed elevated at near 90 degrees.  Conversational dyspnea otherwise appears comfortable.  Pink sputum seen in spit cup at his bedside  HENT:     Head: Normocephalic and atraumatic.  Cardiovascular:     Rate and Rhythm: Regular rhythm. Tachycardia present.     Heart sounds: Normal heart sounds.  Pulmonary:     Effort: Tachypnea present.     Breath sounds: Rales present.  Abdominal:     Palpations: Abdomen is soft.     Tenderness: There is no abdominal tenderness.  Neurological:     Mental Status: Mental status is at baseline.     Labs on Admission: I have personally reviewed following labs and imaging studies  CBC: Recent Labs  Lab 06/17/23 1930  WBC 32.4*  HGB 14.0  HCT 40.9  MCV 92.1  PLT 566*   Basic Metabolic Panel: Recent Labs  Lab 06/17/23 1930  NA 128*  K 3.7  CL 91*  CO2 26  GLUCOSE 170*  BUN 31*  CREATININE 0.91  CALCIUM 8.1*   GFR: CrCl cannot be calculated (Unknown ideal weight.). Liver Function Tests: Recent Labs  Lab 06/17/23 1930  AST 29  ALT 15  ALKPHOS 138*  BILITOT 0.6  PROT 7.3  ALBUMIN 2.7*   No results for input(s): "LIPASE", "AMYLASE" in the last 168 hours. No results for input(s): "AMMONIA" in the last 168 hours. Coagulation Profile: No results for input(s): "INR", "PROTIME" in the last 168 hours. Cardiac Enzymes: No results for input(s): "CKTOTAL", "CKMB", "CKMBINDEX", "TROPONINI" in the last 168 hours. BNP (last 3 results) No results for input(s): "PROBNP" in the last 8760 hours. HbA1C: No results for input(s): "HGBA1C" in the last 72 hours. CBG: No results for input(s): "GLUCAP" in the last 168 hours. Lipid Profile: No results for input(s): "CHOL", "HDL", "LDLCALC", "TRIG", "CHOLHDL", "LDLDIRECT" in the last 72 hours. Thyroid Function  Tests: No results for input(s): "TSH", "T4TOTAL", "FREET4", "T3FREE", "THYROIDAB" in the last 72 hours. Anemia Panel: No results for input(s): "VITAMINB12", "FOLATE", "FERRITIN", "TIBC", "IRON", "RETICCTPCT" in the last 72 hours. Urine analysis: No results found for: "COLORURINE", "APPEARANCEUR", "LABSPEC", "PHURINE", "GLUCOSEU", "HGBUR", "BILIRUBINUR", "KETONESUR", "PROTEINUR", "UROBILINOGEN", "NITRITE", "LEUKOCYTESUR"  Radiological Exams on Admission: CT Angio Chest Pulmonary Embolism (PE) W or WO Contrast  Result Date: 06/17/2023 CLINICAL DATA:  Shortness of breath for several days, initial encounter EXAM: CT ANGIOGRAPHY CHEST WITH CONTRAST TECHNIQUE: Multidetector CT imaging of the chest was performed using the standard protocol during bolus administration of intravenous contrast. Multiplanar CT image reconstructions and MIPs were obtained to evaluate the vascular anatomy. RADIATION DOSE REDUCTION: This exam was performed according to the departmental dose-optimization program which includes automated exposure control, adjustment of  the mA and/or kV according to patient size and/or use of iterative reconstruction technique. CONTRAST:  75mL OMNIPAQUE IOHEXOL 350 MG/ML SOLN COMPARISON:  Plain film from earlier in the same day. FINDINGS: Cardiovascular: Atherosclerotic calcifications of the thoracic aorta are noted. No aneurysmal dilatation or dissection is seen. Heart is not significantly enlarged in size. Pulmonary artery shows a normal branching pattern bilaterally with the exception of the upper lobe on the left which demonstrates some attenuation related to the underlying scarring. No filling defects to suggest pulmonary emboli are noted. Mediastinum/Nodes: Thoracic inlet is within normal limits. Scattered small mediastinal lymph nodes are noted likely reactive in nature. Calcified nodes are noted within the mediastinum and left hilum similar to that seen on prior exam. The esophagus as visualized is  within normal limits. Lungs/Pleura: Right lung demonstrates diffuse emphysematous changes. No focal infiltrate or effusion is seen. Some scarring in the posterolateral right upper lobe is seen stable from the prior exam. The left lower lobe demonstrates emphysematous changes. Calcified granuloma is noted within the lingula. Extensive acute infiltrate is noted within the left upper lobe superimposed over more chronic areas of scarring. No sizable effusion is seen. Upper Abdomen: Visualized upper abdomen is within normal limits. Musculoskeletal: Degenerative changes of the thoracic spine are noted. Review of the MIP images confirms the above findings. IMPRESSION: No evidence of pulmonary emboli. New left upper lobe pneumonia superimposed over more chronic changes seen on prior exams. Changes consistent with prior granulomatous disease. Aortic Atherosclerosis (ICD10-I70.0) and Emphysema (ICD10-J43.9). Electronically Signed   By: Alcide Clever M.D.   On: 06/17/2023 20:57   DG Chest 2 View  Result Date: 06/17/2023 CLINICAL DATA:  Shortness of breath EXAM: CHEST - 2 VIEW COMPARISON:  03/22/2023 FINDINGS: Cardiac shadow is within normal limits. Lungs are well aerated bilaterally. Calcified granuloma is again noted in the left mid lung. Considerable increased airspace opacity is noted in the left upper lobe new from the prior exam. This is superimposed over areas of previous scarring in the left upper lobe. Degenerative changes of the thoracic spine are noted. IMPRESSION: Left upper lobe pneumonia. Electronically Signed   By: Alcide Clever M.D.   On: 06/17/2023 20:17     Data Reviewed: Relevant notes from primary care and specialist visits, past discharge summaries as available in EHR, including Care Everywhere. Prior diagnostic testing as pertinent to current admission diagnoses Updated medications and problem lists for reconciliation ED course, including vitals, labs, imaging, treatment and response to  treatment Triage notes, nursing and pharmacy notes and ED provider's notes Notable results as noted in HPI   Assessment and Plan: * Left upper lobe pneumonia Sepsis Patient tachycardic and tachypneic with leukocytosis to 32,000, lactic acid pending--->1.4>2.1 Sepsis fluid bolus and maintenance Continue Rocephin and azithromycin DuoNebs as needed Supplemental oxygen Antitussives Follow cultures Will get respiratory viral panel  Supratherapeutic INR Blood-tinged sputum INR returned at 12 Patient noted to have blood-tinged sputum in a cup at bedside, states it has been like that for the past day Vitamin K 2.5 mg p.o. ordered Daily INR monitoring Given PE was in 2022 can discuss discontinuation of Coumadin or transitioning to a DOAC with PCP  History of pulmonary embolism Warfarin anticoagulation with supratherapeutic INR CTA negative for PE Holding Coumadin due to supratherapeutic value of 12-no active bleeding Pharmacy consult for Coumadin management  History of seizures Remote history of seizure.  Last seizure 1980s Continue Tegretol  COPD GOLD III Pulmonary hypertension Not acutely exacerbated Continue home inhalers DuoNebs  as needed    DVT prophylaxis: coumadin  Consults: none  Advance Care Planning:   Code Status: Prior   Family Communication: none  Disposition Plan: Back to previous home environment  Severity of Illness: The appropriate patient status for this patient is INPATIENT. Inpatient status is judged to be reasonable and necessary in order to provide the required intensity of service to ensure the patient's safety. The patient's presenting symptoms, physical exam findings, and initial radiographic and laboratory data in the context of their chronic comorbidities is felt to place them at high risk for further clinical deterioration. Furthermore, it is not anticipated that the patient will be medically stable for discharge from the hospital within 2  midnights of admission.   * I certify that at the point of admission it is my clinical judgment that the patient will require inpatient hospital care spanning beyond 2 midnights from the point of admission due to high intensity of service, high risk for further deterioration and high frequency of surveillance required.*  Author: Andris Baumann, MD 06/17/2023 10:39 PM  For on call review [email or web address].

## 2023-06-17 NOTE — Assessment & Plan Note (Addendum)
Pulmonary hypertension Not acutely exacerbated Continue home inhalers DuoNebs as needed

## 2023-06-17 NOTE — ED Triage Notes (Signed)
Pt to ed from home via [facility] for SOB since Tuesday. Pt has HX of PE.  90% on exertion for EMS Pt wears chronic 2 liters but ems placed on 4. LS clear 30 RR  115 HR  12 lead ST Coumadin  Pt is caox4, in some resp distress and in wheel chair in triage.

## 2023-06-17 NOTE — Progress Notes (Signed)
CODE SEPSIS - PHARMACY COMMUNICATION  **Broad Spectrum Antibiotics should be administered within 1 hour of Sepsis diagnosis**  Time Code Sepsis Called/Page Received: 2133  Antibiotics Ordered: ceftriaxone, azith  Time of 1st antibiotic administration: 2242  Additional action taken by pharmacy: called RN Salley Hews @2231 - RN had a code stroke and was just getting blood cx prior to hanging  If necessary, Name of Provider/Nurse Contacted: Jerral Ralph ,PharmD Clinical Pharmacist  06/17/2023  11:06 PM

## 2023-06-18 ENCOUNTER — Inpatient Hospital Stay: Payer: Medicare Other

## 2023-06-18 DIAGNOSIS — Z87898 Personal history of other specified conditions: Secondary | ICD-10-CM

## 2023-06-18 DIAGNOSIS — E871 Hypo-osmolality and hyponatremia: Secondary | ICD-10-CM | POA: Diagnosis present

## 2023-06-18 DIAGNOSIS — R791 Abnormal coagulation profile: Secondary | ICD-10-CM

## 2023-06-18 DIAGNOSIS — J189 Pneumonia, unspecified organism: Secondary | ICD-10-CM | POA: Diagnosis not present

## 2023-06-18 LAB — RESPIRATORY PANEL BY PCR

## 2023-06-18 LAB — CBC WITH DIFFERENTIAL/PLATELET
Abs Immature Granulocytes: 0.38 10*3/uL — ABNORMAL HIGH (ref 0.00–0.07)
Basophils Absolute: 0.1 10*3/uL (ref 0.0–0.1)
Basophils Relative: 0 %
Eosinophils Absolute: 0.1 10*3/uL (ref 0.0–0.5)
Eosinophils Relative: 0 %
HCT: 38.9 % — ABNORMAL LOW (ref 39.0–52.0)
Hemoglobin: 13 g/dL (ref 13.0–17.0)
Immature Granulocytes: 1 %
Lymphocytes Relative: 1 %
Lymphs Abs: 0.4 10*3/uL — ABNORMAL LOW (ref 0.7–4.0)
MCH: 31.5 pg (ref 26.0–34.0)
MCHC: 33.4 g/dL (ref 30.0–36.0)
MCV: 94.2 fL (ref 80.0–100.0)
Monocytes Absolute: 1.5 10*3/uL — ABNORMAL HIGH (ref 0.1–1.0)
Monocytes Relative: 5 %
Neutro Abs: 27.8 10*3/uL — ABNORMAL HIGH (ref 1.7–7.7)
Neutrophils Relative %: 93 %
Platelets: 416 10*3/uL — ABNORMAL HIGH (ref 150–400)
RBC: 4.13 MIL/uL — ABNORMAL LOW (ref 4.22–5.81)
RDW: 12.8 % (ref 11.5–15.5)
Smear Review: NORMAL
WBC: 30.2 10*3/uL — ABNORMAL HIGH (ref 4.0–10.5)
nRBC: 0 % (ref 0.0–0.2)

## 2023-06-18 LAB — MAGNESIUM: Magnesium: 2.3 mg/dL (ref 1.7–2.4)

## 2023-06-18 LAB — PHOSPHORUS: Phosphorus: 2.7 mg/dL (ref 2.5–4.6)

## 2023-06-18 LAB — PROCALCITONIN: Procalcitonin: 0.72 ng/mL

## 2023-06-18 LAB — BASIC METABOLIC PANEL
Anion gap: 8 (ref 5–15)
BUN: 20 mg/dL (ref 8–23)
CO2: 27 mmol/L (ref 22–32)
Calcium: 7.7 mg/dL — ABNORMAL LOW (ref 8.9–10.3)
Chloride: 96 mmol/L — ABNORMAL LOW (ref 98–111)
Creatinine, Ser: 0.59 mg/dL — ABNORMAL LOW (ref 0.61–1.24)
GFR, Estimated: >60 mL/min (ref >60)
Glucose, Bld: 123 mg/dL — ABNORMAL HIGH (ref 70–99)
Potassium: 3.5 mmol/L (ref 3.5–5.1)
Sodium: 131 mmol/L — ABNORMAL LOW (ref 135–145)

## 2023-06-18 LAB — CORTISOL-AM, BLOOD: Cortisol - AM: 35.8 ug/dL — ABNORMAL HIGH (ref 6.7–22.6)

## 2023-06-18 LAB — PROTIME-INR
INR: 11.4 (ref 0.8–1.2)
Prothrombin Time: 88.6 seconds — ABNORMAL HIGH (ref 11.4–15.2)

## 2023-06-18 LAB — OSMOLALITY: Osmolality: 280 mOsm/kg (ref 275–295)

## 2023-06-18 LAB — EXPECTORATED SPUTUM ASSESSMENT W GRAM STAIN, RFLX TO RESP C

## 2023-06-18 LAB — LACTIC ACID, PLASMA
Lactic Acid, Venous: 2.1 mmol/L (ref 0.5–1.9)
Lactic Acid, Venous: 1.5 mmol/L (ref 0.5–1.9)

## 2023-06-18 LAB — TSH: TSH: 1.085 u[IU]/mL (ref 0.350–4.500)

## 2023-06-18 LAB — SARS CORONAVIRUS 2 BY RT PCR: SARS Coronavirus 2 by RT PCR: NEGATIVE

## 2023-06-18 MED ORDER — METHYLPREDNISOLONE SODIUM SUCC 125 MG IJ SOLR
60.0000 mg | Freq: Once | INTRAMUSCULAR | Status: AC
  Administered 2023-06-18: 60 mg via INTRAVENOUS
  Filled 2023-06-18: qty 2

## 2023-06-18 MED ORDER — SODIUM CHLORIDE 0.9 % IV SOLN: INTRAVENOUS | Status: DC

## 2023-06-18 MED ORDER — PHYTONADIONE 5 MG PO TABS
2.5000 mg | ORAL_TABLET | Freq: Once | ORAL | Status: AC
  Administered 2023-06-18: 2.5 mg via ORAL
  Filled 2023-06-18: qty 1

## 2023-06-18 MED ORDER — SODIUM CHLORIDE 0.9 % IV SOLN
2.0000 g | Freq: Two times a day (BID) | INTRAVENOUS | Status: DC
  Administered 2023-06-18 – 2023-06-19 (×3): 2 g via INTRAVENOUS
  Filled 2023-06-18 (×3): qty 12.5

## 2023-06-18 MED ORDER — DULOXETINE HCL 30 MG PO CPEP
30.0000 mg | ORAL_CAPSULE | Freq: Every day | ORAL | Status: DC
  Administered 2023-06-18 – 2023-06-23 (×6): 30 mg via ORAL
  Filled 2023-06-18 (×6): qty 1

## 2023-06-18 MED ORDER — TRAZODONE HCL 50 MG PO TABS
50.0000 mg | ORAL_TABLET | Freq: Every evening | ORAL | Status: DC | PRN
  Administered 2023-06-19 – 2023-06-20 (×2): 50 mg via ORAL
  Filled 2023-06-18 (×2): qty 1

## 2023-06-18 MED ORDER — PREDNISONE 50 MG PO TABS: 60.0000 mg | ORAL_TABLET | Freq: Every day | ORAL | Status: DC

## 2023-06-18 MED ORDER — IPRATROPIUM-ALBUTEROL 0.5-2.5 (3) MG/3ML IN SOLN: 3.0000 mL | RESPIRATORY_TRACT | Status: DC | PRN

## 2023-06-18 MED ORDER — IPRATROPIUM-ALBUTEROL 0.5-2.5 (3) MG/3ML IN SOLN
3.0000 mL | Freq: Four times a day (QID) | RESPIRATORY_TRACT | Status: DC
  Administered 2023-06-18 (×3): 3 mL via RESPIRATORY_TRACT
  Filled 2023-06-18 (×3): qty 3

## 2023-06-18 MED ORDER — METHYLPREDNISOLONE SODIUM SUCC 125 MG IJ SOLR: 60.0000 mg | Freq: Once | INTRAMUSCULAR | Status: DC

## 2023-06-18 MED ORDER — IPRATROPIUM-ALBUTEROL 0.5-2.5 (3) MG/3ML IN SOLN
3.0000 mL | Freq: Three times a day (TID) | RESPIRATORY_TRACT | Status: DC
  Administered 2023-06-19: 3 mL via RESPIRATORY_TRACT
  Filled 2023-06-18: qty 3

## 2023-06-18 NOTE — Progress Notes (Incomplete)
ANTICOAGULATION CONSULT NOTE - Initial Consult  Pharmacy Consult for warfarin Indication: hx pulmonary embolus  Allergies  Allergen Reactions   Wound Dressing Adhesive Rash    Rash with blisters; band-aids espescially. Per [facility] Clinical Summary   Latex Rash    Per [facility] Clinical Summary    Patient Measurements: Height: 5\' 10"  (177.8 cm) Weight: 57.9 kg (127 lb 10.3 oz) IBW/kg (Calculated) : 73 Heparin Dosing Weight:    Vital Signs: Temp: 98 F (36.7 C) (07/21 0824) BP: 128/74 (07/21 0824) Pulse Rate: 96 (07/21 0824)  Labs: Recent Labs    06/17/23 1930 06/17/23 2117 06/17/23 2236 06/18/23 0531 06/18/23 0753  HGB 14.0  --   --   --  13.0  HCT 40.9  --   --   --  38.9*  PLT 566*  --   --   --  416*  LABPROT  --   --  92.9* 88.6*  --   INR  --   --  12.2* 11.4*  --   CREATININE 0.91  --   --   --   --   TROPONINIHS 17 11  --   --   --     Estimated Creatinine Clearance: 56.6 mL/min (by C-G formula based on SCr of 0.91 mg/dL).   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  Started on Tegretol.  No further episodes.    Medications:  Medications Prior to Admission  Medication Sig Dispense Refill Last Dose   albuterol (VENTOLIN HFA) 108 (90 Base) MCG/ACT inhaler Inhale 2 puffs into the lungs every 4 (four) hours as needed.   06/17/2023 at unknown   carbamazepine (TEGRETOL) 200 MG tablet Take 1 tablet by  mouth 3 (three) times daily.   06/16/2023 at unknown   DULoxetine (CYMBALTA) 30 MG capsule Take 30 mg by mouth daily.   06/17/2023 at unknown   TRELEGY ELLIPTA 100-62.5-25 MCG/ACT AEPB Inhale 1 puff into the lungs daily.   06/17/2023 at unknown   warfarin (COUMADIN) 1 MG tablet Take by mouth as directed.      warfarin (COUMADIN) 2.5 MG tablet Take 1 tablet (2.5 mg total) by mouth daily. (Patient taking differently: as directed.) 30 tablet 0    warfarin (COUMADIN) 3 MG tablet Take by mouth as directed.      warfarin (COUMADIN) 5 MG tablet Take by mouth as directed.      budesonide-formoterol (SYMBICORT) 160-4.5 MCG/ACT inhaler Take 2 puffs first thing in am and then another 2 puffs about 12 hours later. (Patient not taking: Reported on 01/23/2023) 1 Inhaler 1    cyanocobalamin (VITAMIN B12) 1000 MCG tablet Take 1,000 mcg by mouth daily. PRN (Patient not taking: Reported on 06/17/2023)   Not Taking   DULoxetine (CYMBALTA) 20 MG capsule Take 1 tablet by mouth daily. (Patient not taking: Reported on 06/17/2023)   Not Taking  Scheduled:   carbamazepine  200 mg Oral TID   DULoxetine  30 mg Oral Daily   fluticasone furoate-vilanterol  1 puff Inhalation Daily   ipratropium-albuterol  3 mL Nebulization Q6H   phytonadione  2.5 mg Oral Once   Infusions:   sodium chloride     ceFEPime (MAXIPIME) IV      Assessment: 77 yo male admitted w/ SHOB, Pneumonia.  Pt on warfarin for hx of PE. Per anticoag visit note pt on 48mg  TWD and last INR on 7/12 was 2.9.  Daughter states pt took 7.5 mg today 7/20 and is supposed to take 7 mg on Sunday and 6 mg on Monday, 7 mg Tuesday..   7/20 INR@2236 = 12.2   HOLD warfarin  (no evidence of bleeding per H&P)   *Vit K 2.5 mg po ordered   Goal of Therapy:  INR 2-3 Monitor platelets by anticoagulation protocol: Yes   Plan:  INR SUPRAtherapeutic, Hold Warfarin F/u INR/CBC in am DDI: Azithromycin,    carbamazepine (PTA)  Viveca Beckstrom A Torian Thoennes 06/18/2023,9:16 AM

## 2023-06-18 NOTE — Progress Notes (Signed)
ANTICOAGULATION CONSULT NOTE - Initial Consult  Pharmacy Consult for warfarin Indication: hx pulmonary embolus  Allergies  Allergen Reactions   Wound Dressing Adhesive Rash    Rash with blisters; band-aids espescially. Per [facility] Clinical Summary   Latex Rash    Per [facility] Clinical Summary    Patient Measurements: Height: 5\' 10"  (177.8 cm) Weight: 57.9 kg (127 lb 10.3 oz) IBW/kg (Calculated) : 73 Heparin Dosing Weight:    Vital Signs: Temp: 98 F (36.7 C) (07/21 0824) BP: 128/74 (07/21 0824) Pulse Rate: 96 (07/21 0824)  Labs: Recent Labs    06/17/23 1930 06/17/23 2117 06/17/23 2236 06/18/23 0531 06/18/23 0753 06/18/23 0901  HGB 14.0  --   --   --  13.0  --   HCT 40.9  --   --   --  38.9*  --   PLT 566*  --   --   --  416*  --   LABPROT  --   --  92.9* 88.6*  --   --   INR  --   --  12.2* 11.4*  --   --   CREATININE 0.91  --   --   --   --  0.59*  TROPONINIHS 17 11  --   --   --   --     Estimated Creatinine Clearance: 64.3 mL/min (A) (by C-G formula based on SCr of 0.59 mg/dL (L)).   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  Started on Tegretol.  No further episodes.    Medications:  Medications Prior to Admission  Medication Sig Dispense Refill Last Dose   albuterol (VENTOLIN HFA) 108 (90 Base) MCG/ACT inhaler Inhale 2 puffs into the lungs every 4 (four) hours as needed.   06/17/2023 at unknown    carbamazepine (TEGRETOL) 200 MG tablet Take 1 tablet by mouth 3 (three) times daily.   06/16/2023 at unknown   DULoxetine (CYMBALTA) 30 MG capsule Take 30 mg by mouth daily.   06/17/2023 at unknown   TRELEGY ELLIPTA 100-62.5-25 MCG/ACT AEPB Inhale 1 puff into the lungs daily.   06/17/2023 at unknown   warfarin (COUMADIN) 1 MG tablet Take by mouth as directed.      warfarin (COUMADIN) 2.5 MG tablet Take 1 tablet (2.5 mg total) by mouth daily. (Patient taking differently: as directed.) 30 tablet 0    warfarin (COUMADIN) 3 MG tablet Take by mouth as directed.      warfarin (COUMADIN) 5 MG tablet Take by mouth as directed.      budesonide-formoterol (SYMBICORT) 160-4.5 MCG/ACT inhaler Take 2 puffs first thing in am and then another 2 puffs about 12 hours later. (Patient not taking: Reported on 01/23/2023) 1 Inhaler 1    cyanocobalamin (VITAMIN B12) 1000 MCG tablet Take 1,000 mcg by mouth daily. PRN (Patient not taking: Reported on 06/17/2023)   Not Taking  DULoxetine (CYMBALTA) 20 MG capsule Take 1 tablet by mouth daily. (Patient not taking: Reported on 06/17/2023)   Not Taking   Scheduled:   carbamazepine  200 mg Oral TID   DULoxetine  30 mg Oral Daily   fluticasone furoate-vilanterol  1 puff Inhalation Daily   ipratropium-albuterol  3 mL Nebulization Q6H   Infusions:   sodium chloride 75 mL/hr at 06/18/23 1100   ceFEPime (MAXIPIME) IV 2 g (06/18/23 1117)    Assessment: 77 yo male admitted w/ SHOB, Pneumonia.  Pt on warfarin for hx of PE. Per anticoag visit note pt on 48mg  TWD and last INR on 7/12 was 2.9.  Daughter states pt took 7.5 mg today 7/20 and is supposed to take 7 mg on Sunday and 6 mg on Monday, 7 mg Tuesday..   7/20 INR@2236 = 12.2   HOLD warfarin  (no evidence of bleeding per H&P)   *Vit K 2.5 mg po ordered 7/21 INR @ 0531 = 11.4, hold warfarin, additional 2.5 mg Vit K given 7/21 @ 1006  Goal of Therapy:  INR 2-3 Monitor platelets by anticoagulation protocol: Yes   Plan:  INR  is supratherapeutic Hold warfarin Daily INR while inpatient  Barrie Folk, PharmD 06/18/2023,12:42 PM

## 2023-06-18 NOTE — Progress Notes (Signed)
       CROSS COVER NOTE  NAME: Adonus Uselman MRN: [id number] DOB : 18-Apr-1946    Concern as stated by nurse / staff   Patient is c/o worsening SOB since he arrived. He did sound coarse and is using abdominal muscles while breathing. O2 97% on 4L. LR running 150/hr. Should I slow the fluids? He does use inhalers, but no nebs usually. His inhalers are scheduled to start later this morning. Please advise as able. Thank you!      Pertinent findings on chart review: H 7P - labs reviewed   Assessment and  Interventions   Assessment: FINDINGS: The left upper lobe pleuroparenchymal opacity is stable to mildly progressive in the interval. Calcified granuloma again noted in the lingula. Lungs are hyperexpanded with diffuse chronic interstitial lung disease. No pleural effusion. The cardiopericardial silhouette is within normal limits for size. No acute bony abnormality.   IMPRESSION: 1. Stable to mildly progressive left upper lobe pleuroparenchymal opacity compatible with pneumonia. 2. Hyperexpansion with chronic interstitial lung disease.   Plan: Solu medrol 60 mg IV x 1 f/b 60 oral prednisone daily for 4 doses starting tomorrow Added duoneb prn      Donnie Mesa NP [facility] Cross Cover 7pm-7am - check amion for availability Pager [phone]

## 2023-06-18 NOTE — Progress Notes (Addendum)
ANTICOAGULATION CONSULT NOTE - Initial Consult  Pharmacy Consult for warfarin Indication: hx pulmonary embolus  Allergies  Allergen Reactions   Wound Dressing Adhesive Rash    Rash with blisters; band-aids espescially. Per [facility] Clinical Summary   Latex Rash    Per [facility] Clinical Summary    Patient Measurements: Height: 5\' 10"  (177.8 cm) IBW/kg (Calculated) : 73 Heparin Dosing Weight:    Vital Signs: Temp: 98.2 F (36.8 C) (07/20 2111) Temp Source: Oral (07/20 2111) BP: 130/72 (07/20 2330) Pulse Rate: 89 (07/20 2330)  Labs: Recent Labs    06/17/23 1930 06/17/23 2117 06/17/23 2236  HGB 14.0  --   --   HCT 40.9  --   --   PLT 566*  --   --   LABPROT  --   --  92.9*  INR  --   --  12.2*  CREATININE 0.91  --   --   TROPONINIHS 17 11  --     CrCl cannot be calculated (Unknown ideal weight.).   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  Started on Tegretol.  No further episodes.    Medications:  (Not in a hospital admission)  Scheduled:   carbamazepine  200 mg Oral TID   DULoxetine  20 mg Oral Daily   fluticasone furoate-vilanterol  1 puff Inhalation Daily   And   umeclidinium bromide  1 puff Inhalation Daily   Infusions:   azithromycin     cefTRIAXone (ROCEPHIN)  IV     lactated ringers Stopped (06/17/23 2247)   lactated ringers      Assessment: 77 yo male admitted w/ SHOB, Pneumonia.  Pt on  warfarin for hx of PE. Per anticoag visit note pt on 48mg  TWD and last INR on 7/12 was 2.9.  Daughter states pt took 7.5 mg today 7/20 and is supposed to take 7 mg on Sunday and 6 mg on Monday, 7 mg Tuesday..   7/20 INR@2236 = 12.2   HOLD warfarin  (no evidence of bleeding per H&P)   *Vit K 2.5 mg po ordered   Goal of Therapy:  INR 2-3 Monitor platelets by anticoagulation protocol: Yes   Plan:  INR SUPRAtherapeutic, Hold Warfarin F/u INR/CBC in am DDI: Azithromycin,    carbamazepine (PTA)  Arielle Eber A 06/18/2023,1:08 AM

## 2023-06-18 NOTE — Progress Notes (Addendum)
PROGRESS NOTE    Jorge Rosales  VOZ:[id number] DOB: [birth year] DOA: 06/17/2023 PCP: [facility]  Outpatient Specialists: cardiology, pulmonology    Brief Narrative:   Jorge Rosales is a 77 y.o. male with medical history significant for COPD on home O2 at 2 L, former smoker, pulmonary hypertension, PE in 2022 on warfarin anticoagulation, who presents to the ED with a 4-day history of shortness of breath and cough.  Denies fever, chills or chest pain ED course and data review: Tachycardic to 118 and tachypneic to 34 with otherwise normal vitals.  Labs significant for WBC of 32,000, lactic acid pending.  Sodium 128 EKG, Personally viewed and interpreted showing sinus tachycardia at 117 with no ischemic ST-T wave changes CT angio of the chest showing pneumonia as follows: New left upper lobe pneumonia superimposed over more chronic changes seen on prior exams. Patient given fluid bolus started on Rocephin and azithromycin and hospitalist consulted for admission.    Assessment & Plan:   Principal Problem:   Left upper lobe pneumonia Active Problems:   Sepsis (HCC)   Supratherapeutic INR   COPD GOLD III   Pulmonary hypertension, unspecified (HCC)   History of pulmonary embolism   Warfarin anticoagulation   CAP (community acquired pneumonia)   Hyponatremia   # Sepsis, severe By lactate, leukocytosis. 2/2 CAP - fluids and abx as below  # CAP LUL infiltrate on CT, no PE or signs heart failure, covid neg. - continue fluids - continue abx but will broadedn ceftriaxone to cefepime given pseudomonas risk factor (severe copd) - f/u blood culture - will order rvp, sputum culture, urine antigens, hiv  # Acute respiratory failure on chronic respiratory failure 2 liters at baseline here requiring 4 - wean o2 as able, goal o2 given copd is 88-92  # Supratherapeutic INR 12s on admission down to 11s today, some blood tinged sputum but otherwise no bleeding. Received vit k 2.5  yesterday - monitor for bleeding - daily inr - will give an additional dose of vit k 2.5 oral  # Hyponatremia Possibly hypovolemic given sepsis, possibly siadh from pulmonary process, baseline sodium appears to be low 130s, here 138 - continue fluids - tsh, add-on cortisol, serum and urine osm, urine sodium pending  # History DVT/PE Supratherapeutic inr as above - daily INR - will query pharmacy about cost of switching to a DOAC. Daughter says this is due to interaction with carbamazepine. I've asked pharmacy to investigate  # Debility - PT/OT consults tomorrow  # COPD CAP is primarily driving symptoms but will likely benefit from steroids - methylpred 1 mg/kg today, possibly convert to orals tomorrow - duonebs q6  # Chronic pain - home duloxetine  # Seizure disorder - home tegretol  DVT prophylaxis: inr supratherapeutic Code Status: full Family Communication: daughter updated @ bedside  Level of care: Telemetry Medical Status is: Inpatient Remains inpatient appropriate because: severity of illness    Consultants:  none  Procedures: none  Antimicrobials:  Ceftriaxone/azithromycin>cefepime    Subjective: Says breathing stable but still feeling dyspneic  Objective: Vitals:   06/17/23 2300 06/17/23 2330 06/18/23 0148 06/18/23 0824  BP: (!) 89/59 130/72 (!) 139/98 128/74  Pulse:  89 94 96  Resp: 19 (!) 23 20 16   Temp:   99.5 F (37.5 C) 98 F (36.7 C)  TempSrc:      SpO2:  100% 99% 96%  Height:        Intake/Output Summary (Last 24 hours) at 06/18/2023 0830 Last  data filed at 06/18/2023 0600 Gross per 24 hour  Intake 2229.63 ml  Output 600 ml  Net 1629.63 ml   There were no vitals filed for this visit.  Examination:  General exam: Appears calm and comfortable  Respiratory system: no tachypnea. Exp wheeze Cardiovascular system: S1 & S2 heard, RRR.   Gastrointestinal system: Abdomen is nondistended, soft and nontender. No organomegaly or masses  felt. Normal bowel sounds heard. Central nervous system: Alert and oriented. No focal neurological deficits. Extremities: Symmetric 5 x 5 power. Skin: No rashes, lesions or ulcers Psychiatry: Judgement and insight appear normal. Mood & affect appropriate.     Data Reviewed: I have personally reviewed following labs and imaging studies  CBC: Recent Labs  Lab 06/17/23 1930 06/18/23 0753  WBC 32.4* 30.2*  NEUTROABS  --  PENDING  HGB 14.0 13.0  HCT 40.9 38.9*  MCV 92.1 94.2  PLT 566* 416*   Basic Metabolic Panel: Recent Labs  Lab 06/17/23 1930  NA 128*  K 3.7  CL 91*  CO2 26  GLUCOSE 170*  BUN 31*  CREATININE 0.91  CALCIUM 8.1*   GFR: CrCl cannot be calculated (Unknown ideal weight.). Liver Function Tests: Recent Labs  Lab 06/17/23 1930  AST 29  ALT 15  ALKPHOS 138*  BILITOT 0.6  PROT 7.3  ALBUMIN 2.7*   No results for input(s): "LIPASE", "AMYLASE" in the last 168 hours. No results for input(s): "AMMONIA" in the last 168 hours. Coagulation Profile: Recent Labs  Lab 06/17/23 2236 06/18/23 0531  INR 12.2* 11.4*   Cardiac Enzymes: No results for input(s): "CKTOTAL", "CKMB", "CKMBINDEX", "TROPONINI" in the last 168 hours. BNP (last 3 results) No results for input(s): "PROBNP" in the last 8760 hours. HbA1C: No results for input(s): "HGBA1C" in the last 72 hours. CBG: No results for input(s): "GLUCAP" in the last 168 hours. Lipid Profile: No results for input(s): "CHOL", "HDL", "LDLCALC", "TRIG", "CHOLHDL", "LDLDIRECT" in the last 72 hours. Thyroid Function Tests: No results for input(s): "TSH", "T4TOTAL", "FREET4", "T3FREE", "THYROIDAB" in the last 72 hours. Anemia Panel: No results for input(s): "VITAMINB12", "FOLATE", "FERRITIN", "TIBC", "IRON", "RETICCTPCT" in the last 72 hours. Urine analysis: No results found for: "COLORURINE", "APPEARANCEUR", "LABSPEC", "PHURINE", "GLUCOSEU", "HGBUR", "BILIRUBINUR", "KETONESUR", "PROTEINUR", "UROBILINOGEN",  "NITRITE", "LEUKOCYTESUR" Sepsis Labs: @LABRCNTIP (procalcitonin:4,lacticidven:4)  ) Recent Results (from the past 240 hour(s))  Blood culture (routine x 2)     Status: None (Preliminary result)   Collection Time: 06/17/23 10:36 PM   Specimen: BLOOD LEFT ARM  Result Value Ref Range Status   Specimen Description BLOOD LEFT ARM  Final   Special Requests   Final    BOTTLES DRAWN AEROBIC AND ANAEROBIC Blood Culture adequate volume   Culture  Setup Time PENDING  Incomplete   Culture   Final    NO GROWTH < 12 HOURS Performed at [facility], [address] [place], [place] [address]    Report Status PENDING  Incomplete  Blood culture (routine x 2)     Status: None (Preliminary result)   Collection Time: 06/17/23 10:36 PM   Specimen: BLOOD RIGHT ARM  Result Value Ref Range Status   Specimen Description BLOOD RIGHT ARM  Final   Special Requests   Final    BOTTLES DRAWN AEROBIC AND ANAEROBIC Blood Culture adequate volume   Culture   Final    NO GROWTH < 12 HOURS Performed at [facility], [address], [place], [place] [address]    Report Status PENDING  Incomplete  SARS Coronavirus 2 by RT PCR (hospital order, performed in [facility] hospital lab) *cepheid single result test* Anterior Nasal Swab     Status: None   Collection Time: 06/17/23 11:19 PM   Specimen: Anterior Nasal Swab  Result Value Ref Range Status   SARS Coronavirus 2 by RT PCR NEGATIVE NEGATIVE Final    Comment: (NOTE) SARS-CoV-2 target nucleic acids are NOT DETECTED.  The SARS-CoV-2 RNA is generally detectable in upper and lower respiratory specimens during the acute phase of infection. The lowest concentration of SARS-CoV-2 viral copies this assay can detect is 250 copies / mL. A negative result does not preclude SARS-CoV-2 infection and should not be used as the sole basis for treatment or other patient management decisions.  A negative result may occur with improper specimen collection /  handling, submission of specimen other than nasopharyngeal swab, presence of viral mutation(s) within the areas targeted by this assay, and inadequate number of viral copies (<250 copies / mL). A negative result must be combined with clinical observations, patient history, and epidemiological information.  Fact Sheet for Patients:   RoadLapTop.co.za  Fact Sheet for Healthcare Providers: http://kim-miller.com/  This test is not yet approved or  cleared by the [place] FDA and has been authorized for detection and/or diagnosis of SARS-CoV-2 by FDA under an Emergency Use Authorization (EUA).  This EUA will remain in effect (meaning this test can be used) for the duration of the COVID-19 declaration under Section 564(b)(1) of the Act, 21 U.S.C. section 360bbb-3(b)(1), unless the authorization is terminated or revoked sooner.  Performed at [facility], [address] [place], [place] [address]          Radiology Studies: [facility] Chest [place] 1 View  Result Date: 06/18/2023 CLINICAL DATA:  Shortness of breath. EXAM: PORTABLE CHEST 1 VIEW COMPARISON:  Chest CTA 06/17/2023.  Chest x-ray 06/17/2023. FINDINGS: The left upper lobe pleuroparenchymal opacity is stable to mildly progressive in the interval. Calcified granuloma again noted in the lingula. Lungs are hyperexpanded with diffuse chronic interstitial lung disease. No pleural effusion. The cardiopericardial silhouette is within normal limits for size. No acute bony abnormality. IMPRESSION: 1. Stable to mildly progressive left upper lobe pleuroparenchymal opacity compatible with pneumonia. 2. Hyperexpansion with chronic interstitial lung disease. Electronically Signed   By: Kennith Center M.D.   On: 06/18/2023 05:58   CT Angio Chest Pulmonary Embolism (PE) W or WO Contrast  Result Date: 06/17/2023 CLINICAL DATA:  Shortness of breath for several days, initial encounter EXAM: CT  ANGIOGRAPHY CHEST WITH CONTRAST TECHNIQUE: Multidetector CT imaging of the chest was performed using the standard protocol during bolus administration of intravenous contrast. Multiplanar CT image reconstructions and MIPs were obtained to evaluate the vascular anatomy. RADIATION DOSE REDUCTION: This exam was performed according to the departmental dose-optimization program which includes automated exposure control, adjustment of the mA and/or kV according to patient size and/or use of iterative reconstruction technique. CONTRAST:  75mL OMNIPAQUE IOHEXOL 350 MG/ML SOLN COMPARISON:  Plain film from earlier in the same day. FINDINGS: Cardiovascular: Atherosclerotic calcifications of the thoracic aorta are noted. No aneurysmal dilatation or dissection is seen. Heart is not significantly enlarged in size. Pulmonary artery shows a normal branching pattern bilaterally with the exception of the upper lobe on the left which demonstrates some attenuation related to the underlying scarring. No filling defects to suggest pulmonary emboli are noted. Mediastinum/Nodes: Thoracic inlet is within normal limits. Scattered small mediastinal lymph nodes are noted likely reactive in nature.  Calcified nodes are noted within the mediastinum and left hilum similar to that seen on prior exam. The esophagus as visualized is within normal limits. Lungs/Pleura: Right lung demonstrates diffuse emphysematous changes. No focal infiltrate or effusion is seen. Some scarring in the posterolateral right upper lobe is seen stable from the prior exam. The left lower lobe demonstrates emphysematous changes. Calcified granuloma is noted within the lingula. Extensive acute infiltrate is noted within the left upper lobe superimposed over more chronic areas of scarring. No sizable effusion is seen. Upper Abdomen: Visualized upper abdomen is within normal limits. Musculoskeletal: Degenerative changes of the thoracic spine are noted. Review of the MIP images  confirms the above findings. IMPRESSION: No evidence of pulmonary emboli. New left upper lobe pneumonia superimposed over more chronic changes seen on prior exams. Changes consistent with prior granulomatous disease. Aortic Atherosclerosis (ICD10-I70.0) and Emphysema (ICD10-J43.9). Electronically Signed   By: Alcide Clever M.D.   On: 06/17/2023 20:57   DG Chest 2 View  Result Date: 06/17/2023 CLINICAL DATA:  Shortness of breath EXAM: CHEST - 2 VIEW COMPARISON:  03/22/2023 FINDINGS: Cardiac shadow is within normal limits. Lungs are well aerated bilaterally. Calcified granuloma is again noted in the left mid lung. Considerable increased airspace opacity is noted in the left upper lobe new from the prior exam. This is superimposed over areas of previous scarring in the left upper lobe. Degenerative changes of the thoracic spine are noted. IMPRESSION: Left upper lobe pneumonia. Electronically Signed   By: Alcide Clever M.D.   On: 06/17/2023 20:17        Scheduled Meds:  carbamazepine  200 mg Oral TID   DULoxetine  30 mg Oral Daily   fluticasone furoate-vilanterol  1 puff Inhalation Daily   ipratropium-albuterol  3 mL Nebulization Q6H   phytonadione  2.5 mg Oral Once   Continuous Infusions:  sodium chloride       LOS: 1 day     Silvano Bilis, MD [facility]   If 7PM-7AM, please contact night-coverage [email or web address] Password [email or web address] 06/18/2023, 8:30 AM   \

## 2023-06-18 NOTE — Assessment & Plan Note (Signed)
Remote history of seizure.  Last seizure 1980s Continue Tegretol

## 2023-06-18 NOTE — Consult Note (Signed)
PULMONOLOGY         Date: 06/18/2023,   MRN# [id number] Jorge Rosales Oct 04, 1946     AdmissionWeight: 57.9 kg                 CurrentWeight: 57.9 kg  Referring provider: Dr Ashok Pall   CHIEF COMPLAINT:   Non massive hemoptysis   HISTORY OF PRESENT ILLNESS    Jorge Rosales is a 77 y.o. male with medical history significant for COPD on home O2 at 2 L, former smoker, pulmonary hypertension, PE in 2022 on warfarin anticoagulation, who presents to the ED with a 4-day history of shortness of breath and cough.  Denies fever, chills or chest pain ED course and data review: Tachycardic to 118 and tachypneic to 34 with otherwise normal vitals.  Labs significant for WBC of 32,000, lactic acid pending.  Sodium 128 EKG, Personally viewed and interpreted showing sinus tachycardia at 117 with no ischemic ST-T wave changes CT angio of the chest showing pneumonia as follows:  He sees Pulmonary and has appt with [facility].  Daughter is at bedside and shares he has not followed up and has not had lung cancer screening study.  He has chronic mucus production.     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  Started on Tegretol.  No further episodes.     SURGICAL HISTORY   Past Surgical History:  Procedure Laterality Date   APPENDECTOMY     BIL hip replacement  11/29/2011   BOWEL RESECTION  11/28/1965   INGUINAL  HERNIA REPAIR Bilateral      FAMILY HISTORY   Family History  Problem Relation Age of Onset   Heart disease Father    Lung cancer Father      SOCIAL HISTORY   Social History   Tobacco Use   Smoking status: Former    Current packs/day: 1.00    Average packs/day: 1 pack/day for 58.6 years (58.6 ttl pk-yrs)    Types: Cigarettes    Start date: 11/28/1964  Vaping Use   Vaping status: Never Used  Substance Use Topics   Alcohol use: Yes    Comment: 2 beers/day   Drug use: Never     MEDICATIONS    Home Medication:    Current Medication:  Current Facility-Administered Medications:    0.9 %  sodium chloride infusion, , Intravenous, Continuous, Wouk, Wilfred Curtis, MD   acetaminophen (TYLENOL) tablet 650 mg, 650 mg, Oral, Q6H PRN **OR** acetaminophen (TYLENOL) suppository 650 mg, 650 mg, Rectal, Q6H PRN, Andris Baumann, MD   carbamazepine (TEGRETOL) tablet 200 mg, 200 mg, Oral, TID, Lindajo Royal V, MD, 200 mg at 06/18/23 1006   ceFEPIme (MAXIPIME) 2 g in sodium chloride 0.9 % 100 mL IVPB, 2 g, Intravenous, Q12H, Nazari, Walid A, RPH   DULoxetine (CYMBALTA) DR capsule 30 mg, 30  mg, Oral, Daily, Angelique Blonder, RPH, 30 mg at 06/18/23 1006   fluticasone furoate-vilanterol (BREO ELLIPTA) 100-25 MCG/ACT 1 puff, 1 puff, Inhalation, Daily **AND** [DISCONTINUED] umeclidinium bromide (INCRUSE ELLIPTA) 62.5 MCG/ACT 1 puff, 1 puff, Inhalation, Daily, Para March, Hazel V, MD   ipratropium-albuterol (DUONEB) 0.5-2.5 (3) MG/3ML nebulizer solution 3 mL, 3 mL, Nebulization, Q6H, Wouk, Wilfred Curtis, MD   ondansetron (ZOFRAN) tablet 4 mg, 4 mg, Oral, Q6H PRN **OR** ondansetron (ZOFRAN) injection 4 mg, 4 mg, Intravenous, Q6H PRN, Andris Baumann, MD    ALLERGIES   Wound dressing adhesive and Latex     REVIEW OF SYSTEMS    Review of Systems:  Gen:  Denies  fever, sweats, chills weigh loss  HEENT: Denies blurred vision, double vision, ear pain, eye pain, hearing loss, nose bleeds, sore  throat Cardiac:  No dizziness, chest pain or heaviness, chest tightness,edema Resp:   reports dyspnea chronically  Gi: Denies swallowing difficulty, stomach pain, nausea or vomiting, diarrhea, constipation, bowel incontinence Gu:  Denies bladder incontinence, burning urine Ext:   Denies Joint pain, stiffness or swelling Skin: Denies  skin rash, easy bruising or bleeding or hives Endoc:  Denies polyuria, polydipsia , polyphagia or weight change Psych:   Denies depression, insomnia or hallucinations   Other:  All other systems negative   VS: BP 128/74 (BP Location: Left Arm)   Pulse 96   Temp 98 F (36.7 C)   Resp 16   Ht 5\' 10"  (1.778 m)   Wt 57.9 kg   SpO2 96%   BMI 18.32 kg/m      PHYSICAL EXAM    GENERAL:NAD, no fevers, chills, no weakness no fatigue HEAD: Normocephalic, atraumatic.  EYES: Pupils equal, round, reactive to light. Extraocular muscles intact. No scleral icterus.  MOUTH: Moist mucosal membrane. Dentition intact. No abscess noted.  EAR, NOSE, THROAT: Clear without exudates. No external lesions.  NECK: Supple. No thyromegaly. No nodules. No JVD.  PULMONARY: decreased breath sounds with mild rhonchi worse at bases bilaterally.  CARDIOVASCULAR: S1 and S2. Regular rate and rhythm. No murmurs, rubs, or gallops. No edema. Pedal pulses 2+ bilaterally.  GASTROINTESTINAL: Soft, nontender, nondistended. No masses. Positive bowel sounds. No hepatosplenomegaly.  MUSCULOSKELETAL: No swelling, clubbing, or edema. Range of motion full in all extremities.  NEUROLOGIC: Cranial nerves II through XII are intact. No gross focal neurological deficits. Sensation intact. Reflexes intact.  SKIN: No ulceration, lesions, rashes, or cyanosis. Skin warm and dry. Turgor intact.  PSYCHIATRIC: Mood, affect within normal limits. The patient is awake, alert and oriented x 3. Insight, judgment intact.       IMAGING     CLINICAL DATA:  Shortness of breath for several days,  initial encounter   EXAM: CT ANGIOGRAPHY CHEST WITH CONTRAST   TECHNIQUE: Multidetector CT imaging of the chest was performed using the standard protocol during bolus administration of intravenous contrast. Multiplanar CT image reconstructions and MIPs were obtained to evaluate the vascular anatomy.   RADIATION DOSE REDUCTION: This exam was performed according to the departmental dose-optimization program which includes automated exposure control, adjustment of the mA and/or kV according to patient size and/or use of iterative reconstruction technique.   CONTRAST:  75mL OMNIPAQUE IOHEXOL 350 MG/ML SOLN   COMPARISON:  Plain film from earlier in the same day.   FINDINGS: Cardiovascular: Atherosclerotic calcifications of the thoracic aorta are noted. No aneurysmal dilatation or dissection is seen. Heart is not significantly enlarged in size. Pulmonary artery shows a normal branching pattern bilaterally  with the exception of the upper lobe on the left which demonstrates some attenuation related to the underlying scarring. No filling defects to suggest pulmonary emboli are noted.   Mediastinum/Nodes: Thoracic inlet is within normal limits. Scattered small mediastinal lymph nodes are noted likely reactive in nature. Calcified nodes are noted within the mediastinum and left hilum similar to that seen on prior exam. The esophagus as visualized is within normal limits.   Lungs/Pleura: Right lung demonstrates diffuse emphysematous changes. No focal infiltrate or effusion is seen. Some scarring in the posterolateral right upper lobe is seen stable from the prior exam. The left lower lobe demonstrates emphysematous changes. Calcified granuloma is noted within the lingula. Extensive acute infiltrate is noted within the left upper lobe superimposed over more chronic areas of scarring. No sizable effusion is seen.   Upper Abdomen: Visualized upper abdomen is within normal limits.    Musculoskeletal: Degenerative changes of the thoracic spine are noted.   Review of the MIP images confirms the above findings.   IMPRESSION: No evidence of pulmonary emboli.   New left upper lobe pneumonia superimposed over more chronic changes seen on prior exams.   Changes consistent with prior granulomatous disease.   Aortic Atherosclerosis (ICD10-I70.0) and Emphysema (ICD10-J43.9).     Electronically Signed   By: Alcide Clever M.D.   On: 06/17/2023 20:57    ASSESSMENT/PLAN   Left upper lobe consolidated pneumonia  - There appears to be possible post obstructive process and may be related to malignancy  - there is a background of centrilobular and paraseptal emphysema and bronchitic COPD with segments of fibrosis.   - patient is on 3Lmin [place]  - he has mMCR 4 and is dyspneic with using bathroom and ambulating to bathroom  - he fell and broke few ribs 2 months ago, states he tripped over a box  -currently on cefepime   - resp cultures and RVP is ordered    Non massive hemoptysis    - likely related to supratheraputic INR and consolidated pneumonia   - noted correction of coagulopathy with vitamin K             Thank you for allowing me to participate in the care of this patient.   Patient/Family are satisfied with care plan and all questions have been answered.    Provider disclosure: Patient with at least one acute or chronic illness or injury that poses a threat to life or bodily function and is being managed actively during this encounter.  All of the below services have been performed independently by signing provider:  review of prior documentation from internal and or external health records.  Review of previous and current lab results.  Interview and comprehensive assessment during patient visit today. Review of current and previous chest radiographs/CT scans. Discussion of management and test interpretation with health care team and patient/family.    This document was prepared using Dragon voice recognition software and may include unintentional dictation errors.     Vida Rigger, M.D.  Division of Pulmonary & Critical Care Medicine

## 2023-06-18 NOTE — Plan of Care (Signed)
Patient A&Ox4, from home, admitted for worsening SOB and weakness. Supplemental O2 maintained at 4L, baseline is 2L. INR found to be 12.2-bleeding precautions initiated. Lactic also came back 2.1, on call notified recommending follow up lab draw.

## 2023-06-18 NOTE — Assessment & Plan Note (Addendum)
No evidence of bleeding Hold INR Fall precautions Will allow to trend down.  Vitamin K reversal if evidence of bleeding or not trending down as anticipated

## 2023-06-18 NOTE — Plan of Care (Signed)

## 2023-06-19 DIAGNOSIS — J189 Pneumonia, unspecified organism: Secondary | ICD-10-CM | POA: Diagnosis not present

## 2023-06-19 LAB — CBC
HCT: 37.1 % — ABNORMAL LOW (ref 39.0–52.0)
Hemoglobin: 12.6 g/dL — ABNORMAL LOW (ref 13.0–17.0)
MCH: 31.7 pg (ref 26.0–34.0)
MCHC: 34 g/dL (ref 30.0–36.0)
MCV: 93.2 fL (ref 80.0–100.0)
Platelets: 569 10*3/uL — ABNORMAL HIGH (ref 150–400)
RBC: 3.98 MIL/uL — ABNORMAL LOW (ref 4.22–5.81)
RDW: 12.8 % (ref 11.5–15.5)
WBC: 26.2 10*3/uL — ABNORMAL HIGH (ref 4.0–10.5)
nRBC: 0 % (ref 0.0–0.2)

## 2023-06-19 LAB — CULTURE, BLOOD (ROUTINE X 2): Special Requests: ADEQUATE

## 2023-06-19 LAB — BASIC METABOLIC PANEL
Anion gap: 8 (ref 5–15)
BUN: 13 mg/dL (ref 8–23)
CO2: 27 mmol/L (ref 22–32)
Calcium: 8 mg/dL — ABNORMAL LOW (ref 8.9–10.3)
Chloride: 99 mmol/L (ref 98–111)
Creatinine, Ser: 0.48 mg/dL — ABNORMAL LOW (ref 0.61–1.24)
GFR, Estimated: >60 mL/min (ref >60)
Glucose, Bld: 112 mg/dL — ABNORMAL HIGH (ref 70–99)
Potassium: 3.4 mmol/L — ABNORMAL LOW (ref 3.5–5.1)
Sodium: 134 mmol/L — ABNORMAL LOW (ref 135–145)

## 2023-06-19 LAB — SODIUM, URINE, RANDOM: Sodium, Ur: <10 mmol/L

## 2023-06-19 LAB — CULTURE, RESPIRATORY W GRAM STAIN

## 2023-06-19 LAB — STREP PNEUMONIAE URINARY ANTIGEN: Strep Pneumo Urinary Antigen: NEGATIVE

## 2023-06-19 LAB — HIV ANTIBODY (ROUTINE TESTING W REFLEX): HIV Screen 4th Generation wRfx: NONREACTIVE

## 2023-06-19 LAB — OSMOLALITY, URINE: Osmolality, Ur: 688 mOsm/kg (ref 300–900)

## 2023-06-19 LAB — PROTIME-INR
INR: 1.8 — ABNORMAL HIGH (ref 0.8–1.2)
Prothrombin Time: 21.4 seconds — ABNORMAL HIGH (ref 11.4–15.2)

## 2023-06-19 MED ORDER — POTASSIUM CHLORIDE CRYS ER 20 MEQ PO TBCR
60.0000 meq | EXTENDED_RELEASE_TABLET | Freq: Once | ORAL | Status: AC
  Administered 2023-06-19: 60 meq via ORAL
  Filled 2023-06-19: qty 3

## 2023-06-19 MED ORDER — SODIUM CHLORIDE 0.9 % IV SOLN
2.0000 g | Freq: Three times a day (TID) | INTRAVENOUS | Status: DC
  Administered 2023-06-19 – 2023-06-23 (×12): 2 g via INTRAVENOUS
  Filled 2023-06-19 (×13): qty 12.5

## 2023-06-19 MED ORDER — IPRATROPIUM-ALBUTEROL 0.5-2.5 (3) MG/3ML IN SOLN
3.0000 mL | Freq: Two times a day (BID) | RESPIRATORY_TRACT | Status: DC
  Administered 2023-06-19 – 2023-06-22 (×6): 3 mL via RESPIRATORY_TRACT
  Filled 2023-06-19 (×8): qty 3

## 2023-06-19 MED ORDER — IPRATROPIUM-ALBUTEROL 0.5-2.5 (3) MG/3ML IN SOLN
3.0000 mL | Freq: Four times a day (QID) | RESPIRATORY_TRACT | Status: DC | PRN
  Administered 2023-06-20 (×2): 3 mL via RESPIRATORY_TRACT
  Filled 2023-06-19 (×2): qty 3

## 2023-06-19 MED ORDER — PREDNISONE 20 MG PO TABS
40.0000 mg | ORAL_TABLET | Freq: Every day | ORAL | Status: DC
  Administered 2023-06-19 – 2023-06-21 (×3): 40 mg via ORAL
  Filled 2023-06-19 (×4): qty 2

## 2023-06-19 NOTE — Progress Notes (Addendum)
PROGRESS NOTE    Jorge Rosales  ZOX:[id number] DOB: 1946/09/27 DOA: 06/17/2023 PCP: [facility]  Outpatient Specialists: cardiology, pulmonology    Brief Narrative:   Jorge Rosales is a 77 y.o. male with medical history significant for COPD on home O2 at 2 L, former smoker, pulmonary hypertension, PE in 2022 on warfarin anticoagulation, who presents to the ED with a 4-day history of shortness of breath and cough.  Denies fever, chills or chest pain ED course and data review: Tachycardic to 118 and tachypneic to 34 with otherwise normal vitals.  Labs significant for WBC of 32,000, lactic acid pending.  Sodium 128 EKG, Personally viewed and interpreted showing sinus tachycardia at 117 with no ischemic ST-T wave changes CT angio of the chest showing pneumonia as follows: New left upper lobe pneumonia superimposed over more chronic changes seen on prior exams. Patient given fluid bolus started on Rocephin and azithromycin and hospitalist consulted for admission.    Assessment & Plan:   Principal Problem:   Left upper lobe pneumonia Active Problems:   Sepsis (HCC)   Supratherapeutic INR   History of pulmonary embolism   COPD GOLD III   Pulmonary hypertension, unspecified (HCC)   Warfarin anticoagulation   CAP (community acquired pneumonia)   Hyponatremia   History of seizures   # Sepsis, severe By lactate, leukocytosis. 2/2 CAP. Sepsis physiology now resolved - fluids and abx as below  # CAP LUL infiltrate on CT, no PE or signs heart failure, covid neg. Rvp neg. improving - d/c fluids - continue cefepime(severe copd) - f/u blood  and sputum cultures - f/u urine antigens, hiv  # Debility PT advising HH with rolling walker. Patient says he feels weak and not sure he can function at home - will reach out to PT to ensure their assessment is complete  # Acute respiratory failure on chronic respiratory failure 2 liters at baseline here requiring 4, now weaned down  to home 2 liters - continue O2  # Supratherapeutic INR 12s on admission down to 1.8 today with 5 mg of po vitamin k. Continues to have occasional bloody sptutum, no other bleeding.   - monitor for bleeding - daily inr - [facility] looking into non-coumadin options  # Hyponatremia 2/2 hypovolemia from sepsis as has improved to 134 today - monitor off fluids  # History DVT/PE Supratherapeutic inr as above, now 1.8 - daily INR - [facility] investigating non-coumadin options  # COPD CAP is primarily driving symptoms but will likely benefit from steroids - methylpred 1 mg/kg yesterday, will start orals pred today - duonebs q6  # Chronic pain - home duloxetine  # Seizure disorder? Per daughter had an episode in the 1980s thought to be seizure and has been on anti-seizure meds ever since, not followed by neurology, no further seizure-like activity - would plan on neurology referral at discharge, probably doesn't need to be on an anti-epileptic, and if does other options might be better including options that don't interfere with the novel anticoagulants - continue home tegretol for now  DVT prophylaxis: SCDs for now Code Status: full Family Communication: daughter updated telephonically 7/22  Level of care: Telemetry Medical Status is: Inpatient Remains inpatient appropriate because: severity of illness    Consultants:  none  Procedures: none  Antimicrobials:  Ceftriaxone/azithromycin>cefepime    Subjective: Says breathing stable but still feeling dyspneic, did sleep well last night  Objective: Vitals:   06/18/23 2011 06/19/23 0028 06/19/23 0409 06/19/23 0801  BP: 125/70 Marland Kitchen)  106/55 (!) 109/51 111/80  Pulse: 83 61 76 75  Resp: 20 20 20 14   Temp: (!) 97.5 F (36.4 C) (!) 97.4 F (36.3 C) (!) 97.5 F (36.4 C) 98.3 F (36.8 C)  TempSrc:      SpO2: 100% 99% 100% 99%  Weight:      Height:        Intake/Output Summary (Last 24 hours) at 06/19/2023 1035 Last data filed at  06/19/2023 0543 Gross per 24 hour  Intake 1213.97 ml  Output 1050 ml  Net 163.97 ml   Filed Weights   06/18/23 0824  Weight: 57.9 kg    Examination:  General exam: Appears calm and comfortable  Respiratory system: no tachypnea. Exp wheeze Cardiovascular system: S1 & S2 heard, RRR.   Gastrointestinal system: Abdomen is nondistended, soft and nontender. No organomegaly or masses felt. Normal bowel sounds heard. Central nervous system: Alert and oriented. No focal neurological deficits. Extremities: Symmetric 5 x 5 power. Skin: No rashes, lesions or ulcers Psychiatry: Judgement and insight appear normal. Mood & affect appropriate.     Data Reviewed: I have personally reviewed following labs and imaging studies  CBC: Recent Labs  Lab 06/17/23 1930 06/18/23 0753 06/19/23 0749  WBC 32.4* 30.2* 26.2*  NEUTROABS  --  27.8*  --   HGB 14.0 13.0 12.6*  HCT 40.9 38.9* 37.1*  MCV 92.1 94.2 93.2  PLT 566* 416* 569*   Basic Metabolic Panel: Recent Labs  Lab 06/17/23 1930 06/18/23 0901 06/19/23 0749  NA 128* 131* 134*  K 3.7 3.5 3.4*  CL 91* 96* 99  CO2 26 27 27   GLUCOSE 170* 123* 112*  BUN 31* 20 13  CREATININE 0.91 0.59* 0.48*  CALCIUM 8.1* 7.7* 8.0*  MG  --  2.3  --   PHOS  --  2.7  --    GFR: Estimated Creatinine Clearance: 64.3 mL/min (A) (by C-G formula based on SCr of 0.48 mg/dL (L)). Liver Function Tests: Recent Labs  Lab 06/17/23 1930  AST 29  ALT 15  ALKPHOS 138*  BILITOT 0.6  PROT 7.3  ALBUMIN 2.7*   No results for input(s): "LIPASE", "AMYLASE" in the last 168 hours. No results for input(s): "AMMONIA" in the last 168 hours. Coagulation Profile: Recent Labs  Lab 06/17/23 2236 06/18/23 0531 06/19/23 0749  INR 12.2* 11.4* 1.8*   Cardiac Enzymes: No results for input(s): "CKTOTAL", "CKMB", "CKMBINDEX", "TROPONINI" in the last 168 hours. BNP (last 3 results) No results for input(s): "PROBNP" in the last 8760 hours. HbA1C: No results for  input(s): "HGBA1C" in the last 72 hours. CBG: No results for input(s): "GLUCAP" in the last 168 hours. Lipid Profile: No results for input(s): "CHOL", "HDL", "LDLCALC", "TRIG", "CHOLHDL", "LDLDIRECT" in the last 72 hours. Thyroid Function Tests: Recent Labs    06/18/23 0901  TSH 1.085   Anemia Panel: No results for input(s): "VITAMINB12", "FOLATE", "FERRITIN", "TIBC", "IRON", "RETICCTPCT" in the last 72 hours. Urine analysis: No results found for: "COLORURINE", "APPEARANCEUR", "LABSPEC", "PHURINE", "GLUCOSEU", "HGBUR", "BILIRUBINUR", "KETONESUR", "PROTEINUR", "UROBILINOGEN", "NITRITE", "LEUKOCYTESUR" Sepsis Labs: @LABRCNTIP (procalcitonin:4,lacticidven:4)  ) Recent Results (from the past 240 hour(s))  Blood culture (routine x 2)     Status: None (Preliminary result)   Collection Time: 06/17/23 10:36 PM   Specimen: BLOOD LEFT ARM  Result Value Ref Range Status   Specimen Description BLOOD LEFT ARM  Final   Special Requests   Final    BOTTLES DRAWN AEROBIC AND ANAEROBIC Blood Culture adequate volume   Culture  Final    NO GROWTH 2 DAYS Performed at [facility], [address] [place], [place] [address]    Report Status PENDING  Incomplete  Blood culture (routine x 2)     Status: None (Preliminary result)   Collection Time: 06/17/23 10:36 PM   Specimen: BLOOD RIGHT ARM  Result Value Ref Range Status   Specimen Description BLOOD RIGHT ARM  Final   Special Requests   Final    BOTTLES DRAWN AEROBIC AND ANAEROBIC Blood Culture adequate volume   Culture   Final    NO GROWTH 2 DAYS Performed at [facility], [address] [place], [place] [address]    Report Status PENDING  Incomplete  SARS Coronavirus 2 by RT PCR (hospital order, performed in [facility] hospital lab) *cepheid single result test* Anterior Nasal Swab     Status: None   Collection Time: 06/17/23 11:19 PM   Specimen: Anterior Nasal Swab  Result Value Ref Range Status   SARS Coronavirus 2  by RT PCR NEGATIVE NEGATIVE Final    Comment: (NOTE) SARS-CoV-2 target nucleic acids are NOT DETECTED.  The SARS-CoV-2 RNA is generally detectable in upper and lower respiratory specimens during the acute phase of infection. The lowest concentration of SARS-CoV-2 viral copies this assay can detect is 250 copies / mL. A negative result does not preclude SARS-CoV-2 infection and should not be used as the sole basis for treatment or other patient management decisions.  A negative result may occur with improper specimen collection / handling, submission of specimen other than nasopharyngeal swab, presence of viral mutation(s) within the areas targeted by this assay, and inadequate number of viral copies (<250 copies / mL). A negative result must be combined with clinical observations, patient history, and epidemiological information.  Fact Sheet for Patients:   RoadLapTop.co.za  Fact Sheet for Healthcare Providers: http://kim-miller.com/  This test is not yet approved or  cleared by the [place] FDA and has been authorized for detection and/or diagnosis of SARS-CoV-2 by FDA under an Emergency Use Authorization (EUA).  This EUA will remain in effect (meaning this test can be used) for the duration of the COVID-19 declaration under Section 564(b)(1) of the Act, 21 U.S.C. section 360bbb-3(b)(1), unless the authorization is terminated or revoked sooner.  Performed at [facility], [address] [place], [place] [address]   Expectorated Sputum Assessment w Gram Stain, Rflx to Resp Cult     Status: None   Collection Time: 06/18/23 10:31 AM   Specimen: Sputum  Result Value Ref Range Status   Specimen Description SPUTUM  Final   Special Requests NONE  Final   Sputum evaluation   Final    THIS SPECIMEN IS ACCEPTABLE FOR SPUTUM CULTURE Performed at [facility], [address] [place], [place] [address]    Report  Status 06/18/2023 FINAL  Final  Respiratory (~20 pathogens) panel by PCR     Status: None   Collection Time: 06/18/23 10:31 AM   Specimen: Expectorated Sputum; Respiratory  Result Value Ref Range Status   Adenovirus NOT DETECTED NOT DETECTED Final   Coronavirus 229E NOT DETECTED NOT DETECTED Final    Comment: (NOTE) The Coronavirus on the Respiratory Panel, DOES NOT test for the novel  Coronavirus (2019 nCoV)    Coronavirus HKU1 NOT DETECTED NOT DETECTED Final   Coronavirus NL63 NOT DETECTED NOT DETECTED Final   Coronavirus OC43 NOT DETECTED NOT DETECTED Final   Metapneumovirus NOT DETECTED NOT DETECTED Final  Rhinovirus / Enterovirus NOT DETECTED NOT DETECTED Final   Influenza A NOT DETECTED NOT DETECTED Final   Influenza B NOT DETECTED NOT DETECTED Final   Parainfluenza Virus 1 NOT DETECTED NOT DETECTED Final   Parainfluenza Virus 2 NOT DETECTED NOT DETECTED Final   Parainfluenza Virus 3 NOT DETECTED NOT DETECTED Final   Parainfluenza Virus 4 NOT DETECTED NOT DETECTED Final   Respiratory Syncytial Virus NOT DETECTED NOT DETECTED Final   Bordetella pertussis NOT DETECTED NOT DETECTED Final   Bordetella Parapertussis NOT DETECTED NOT DETECTED Final   Chlamydophila pneumoniae NOT DETECTED NOT DETECTED Final   Mycoplasma pneumoniae NOT DETECTED NOT DETECTED Final    Comment: Performed at [facility], [address] [place], [place] [address]  Culture, Respiratory w Gram Stain     Status: None (Preliminary result)   Collection Time: 06/18/23 10:31 AM   Specimen: SPU  Result Value Ref Range Status   Specimen Description   Final    SPUTUM Performed at [facility], [address], [place], [place] [address]    Special Requests   Final    NONE Reflexed from [id number] Performed at [facility], [address] [place], [place] [address]    Gram Stain   Final    FEW WBC PRESENT,BOTH PMN AND MONONUCLEAR RARE GRAM POSITIVE COCCI IN PAIRS Performed at [facility], [address] [place], [place] [address]    Culture PENDING  Incomplete   Report Status PENDING  Incomplete         Radiology Studies: DG Chest Port 1 View  Result Date: 06/18/2023 CLINICAL DATA:  Shortness of breath. EXAM: PORTABLE CHEST 1 VIEW COMPARISON:  Chest CTA 06/17/2023.  Chest x-ray 06/17/2023. FINDINGS: The left upper lobe pleuroparenchymal opacity is stable to mildly progressive in the interval. Calcified granuloma again noted in the lingula. Lungs are hyperexpanded with diffuse chronic interstitial lung disease. No pleural effusion. The cardiopericardial silhouette is within normal limits for size. No acute bony abnormality. IMPRESSION: 1. Stable to mildly progressive left upper lobe pleuroparenchymal opacity compatible with pneumonia. 2. Hyperexpansion with chronic interstitial lung disease. Electronically Signed   By: Kennith Center M.D.   On: 06/18/2023 05:58   CT Angio Chest Pulmonary Embolism (PE) W or WO Contrast  Result Date: 06/17/2023 CLINICAL DATA:  Shortness of breath for several days, initial encounter EXAM: CT ANGIOGRAPHY CHEST WITH CONTRAST TECHNIQUE: Multidetector CT imaging of the chest was performed using the standard protocol during bolus administration of intravenous contrast. Multiplanar CT image reconstructions and MIPs were obtained to evaluate the vascular anatomy. RADIATION DOSE REDUCTION: This exam was performed according to the departmental dose-optimization program which includes automated exposure control, adjustment of the mA and/or kV according to patient size and/or use of iterative reconstruction technique. CONTRAST:  75mL OMNIPAQUE IOHEXOL 350 MG/ML SOLN COMPARISON:  Plain film from earlier in the same day. FINDINGS: Cardiovascular: Atherosclerotic calcifications of the thoracic aorta are noted. No aneurysmal dilatation or dissection is seen. Heart is not significantly enlarged in size. Pulmonary artery shows a normal branching pattern  bilaterally with the exception of the upper lobe on the left which demonstrates some attenuation related to the underlying scarring. No filling defects to suggest pulmonary emboli are noted. Mediastinum/Nodes: Thoracic inlet is within normal limits. Scattered small mediastinal lymph nodes are noted likely reactive in nature. Calcified nodes are noted within the mediastinum and left hilum similar to that seen on prior exam. The esophagus as visualized is  within normal limits. Lungs/Pleura: Right lung demonstrates diffuse emphysematous changes. No focal infiltrate or effusion is seen. Some scarring in the posterolateral right upper lobe is seen stable from the prior exam. The left lower lobe demonstrates emphysematous changes. Calcified granuloma is noted within the lingula. Extensive acute infiltrate is noted within the left upper lobe superimposed over more chronic areas of scarring. No sizable effusion is seen. Upper Abdomen: Visualized upper abdomen is within normal limits. Musculoskeletal: Degenerative changes of the thoracic spine are noted. Review of the MIP images confirms the above findings. IMPRESSION: No evidence of pulmonary emboli. New left upper lobe pneumonia superimposed over more chronic changes seen on prior exams. Changes consistent with prior granulomatous disease. Aortic Atherosclerosis (ICD10-I70.0) and Emphysema (ICD10-J43.9). Electronically Signed   By: Alcide Clever M.D.   On: 06/17/2023 20:57   DG Chest 2 View  Result Date: 06/17/2023 CLINICAL DATA:  Shortness of breath EXAM: CHEST - 2 VIEW COMPARISON:  03/22/2023 FINDINGS: Cardiac shadow is within normal limits. Lungs are well aerated bilaterally. Calcified granuloma is again noted in the left mid lung. Considerable increased airspace opacity is noted in the left upper lobe new from the prior exam. This is superimposed over areas of previous scarring in the left upper lobe. Degenerative changes of the thoracic spine are noted. IMPRESSION:  Left upper lobe pneumonia. Electronically Signed   By: Alcide Clever M.D.   On: 06/17/2023 20:17        Scheduled Meds:  carbamazepine  200 mg Oral TID   DULoxetine  30 mg Oral Daily   fluticasone furoate-vilanterol  1 puff Inhalation Daily   ipratropium-albuterol  3 mL Nebulization TID   potassium chloride  60 mEq Oral Once   Continuous Infusions:  sodium chloride 75 mL/hr at 06/19/23 0723   ceFEPime (MAXIPIME) IV 2 g (06/19/23 1010)     LOS: 2 days     Silvano Bilis, MD [facility]   If 7PM-7AM, please contact night-coverage [email or web address] Password [facility] 06/19/2023, 10:35 AM   \

## 2023-06-19 NOTE — Plan of Care (Signed)
  Problem: Fluid Volume: Goal: Hemodynamic stability will improve Outcome: Progressing   Problem: Clinical Measurements: Goal: Signs and symptoms of infection will decrease Outcome: Progressing   Problem: Respiratory: Goal: Ability to maintain adequate ventilation will improve Outcome: Progressing   Problem: Education: Goal: Knowledge of General Education information will improve Description: Including pain rating scale, medication(s)/side effects and non-pharmacologic comfort measures Outcome: Progressing   Problem: Clinical Measurements: Goal: Ability to maintain clinical measurements within normal limits will improve Outcome: Progressing Goal: Will remain free from infection Outcome: Progressing Goal: Diagnostic test results will improve Outcome: Progressing Goal: Respiratory complications will improve Outcome: Progressing Goal: Cardiovascular complication will be avoided Outcome: Progressing   Problem: Activity: Goal: Risk for activity intolerance will decrease Outcome: Progressing   Problem: Nutrition: Goal: Adequate nutrition will be maintained Outcome: Progressing   Problem: Coping: Goal: Level of anxiety will decrease Outcome: Progressing   Problem: Elimination: Goal: Will not experience complications related to urinary retention Outcome: Progressing   Problem: Pain Managment: Goal: General experience of comfort will improve Outcome: Progressing   Problem: Safety: Goal: Ability to remain free from injury will improve Outcome: Progressing   Problem: Skin Integrity: Goal: Risk for impaired skin integrity will decrease Outcome: Progressing

## 2023-06-19 NOTE — Progress Notes (Signed)
ANTICOAGULATION CONSULT NOTE - Initial Consult  Pharmacy Consult for warfarin Indication: hx pulmonary embolus  Allergies  Allergen Reactions   Wound Dressing Adhesive Rash    Rash with blisters; band-aids espescially. Per [facility] Clinical Summary   Latex Rash    Per [facility] Clinical Summary    Patient Measurements: Height: 5\' 10"  (177.8 cm) Weight: 57.9 kg (127 lb 10.3 oz) IBW/kg (Calculated) : 73 Heparin Dosing Weight:    Vital Signs: Temp: 98.3 F (36.8 C) (07/22 0801) BP: 111/80 (07/22 0801) Pulse Rate: 75 (07/22 0801)  Labs: Recent Labs    06/17/23 1930 06/17/23 2117 06/17/23 2236 06/18/23 0531 06/18/23 0753 06/18/23 0901 06/19/23 0749  HGB 14.0  --   --   --  13.0  --  12.6*  HCT 40.9  --   --   --  38.9*  --  37.1*  PLT 566*  --   --   --  416*  --  569*  LABPROT  --   --  92.9* 88.6*  --   --  21.4*  INR  --   --  12.2* 11.4*  --   --  1.8*  CREATININE 0.91  --   --   --   --  0.59* 0.48*  TROPONINIHS 17 11  --   --   --   --   --     Estimated Creatinine Clearance: 64.3 mL/min (A) (by C-G formula based on SCr of 0.48 mg/dL (L)).   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  Started on Tegretol.  No further episodes.    Medications:  Medications Prior to Admission  Medication Sig Dispense Refill Last Dose   albuterol (VENTOLIN HFA) 108 (90 Base) MCG/ACT inhaler Inhale 2 puffs into the lungs  every 4 (four) hours as needed.   06/17/2023 at unknown   carbamazepine (TEGRETOL) 200 MG tablet Take 1 tablet by mouth 3 (three) times daily.   06/16/2023 at unknown   DULoxetine (CYMBALTA) 30 MG capsule Take 30 mg by mouth daily.   06/17/2023 at unknown   TRELEGY ELLIPTA 100-62.5-25 MCG/ACT AEPB Inhale 1 puff into the lungs daily.   06/17/2023 at unknown   warfarin (COUMADIN) 1 MG tablet Take by mouth as directed.      warfarin (COUMADIN) 2.5 MG tablet Take 1 tablet (2.5 mg total) by mouth daily. (Patient taking differently: as directed.) 30 tablet 0    warfarin (COUMADIN) 3 MG tablet Take by mouth as directed.      warfarin (COUMADIN) 5 MG tablet Take by mouth as directed.      budesonide-formoterol (SYMBICORT) 160-4.5 MCG/ACT inhaler Take 2 puffs first thing in am and then another 2 puffs about 12 hours later. (Patient not taking: Reported on 01/23/2023) 1 Inhaler 1    cyanocobalamin (VITAMIN B12) 1000 MCG tablet Take 1,000 mcg by mouth daily. PRN (Patient  not taking: Reported on 06/17/2023)   Not Taking   DULoxetine (CYMBALTA) 20 MG capsule Take 1 tablet by mouth daily. (Patient not taking: Reported on 06/17/2023)   Not Taking   Scheduled:   carbamazepine  200 mg Oral TID   DULoxetine  30 mg Oral Daily   fluticasone furoate-vilanterol  1 puff Inhalation Daily   ipratropium-albuterol  3 mL Nebulization TID   potassium chloride  60 mEq Oral Once   predniSONE  40 mg Oral Q breakfast   Infusions:   ceFEPime (MAXIPIME) IV 2 g (06/19/23 1010)   Assessment: 77 yo male admitted w/ SHOB, Pneumonia.  Pt on warfarin for hx of PE. Per anticoag visit note pt on 48mg  TWD and last INR on 7/12 was 2.9.  Per patient's daughter, PTA warfarin regimen is: 7.5 mg every Saturday, 7 mg every Sunday, 6 mg every Monday, 7 mg every Tuesday, 7.5 mg every Wednesday, 7 mg every Thursday, 6 mg every Friday (TWD of 48 mg).  7/20 INR@2236 = 12.2   HOLD warfarin (no evidence of bleeding per H&P)  *Vit K 2.5 mg po  ordered 7/21 INR @ 0531 = 11.4, hold warfarin, additional 2.5 mg Vit K given 7/21 @ 1006 7/22 INR @ 0749 = 1.8, subtherapeutic however patient experiencing mild hemoptysis, CBC stable. Per MD, will hold warfarin dose x1 on 7/22.  Goal of Therapy:  INR 2-3 Monitor platelets by anticoagulation protocol: Yes  Plan:  7/22 INR @0749  = 1.8, subtherapeutic Patient experiencing mild hemoptysis Per MD, will hold warfarin dose x1 on 7/22 Monitor daily INR while inpatient  Rockwell Alexandria, PharmD Clinical Pharmacist 06/19/2023,11:13 AM

## 2023-06-19 NOTE — Evaluation (Signed)
Physical Therapy Evaluation Patient Details Name: Jorge Rosales MRN: [id number] DOB: Oct 17, 1946 Today's Date: 06/19/2023  History of Present Illness  77 y/o male presented to ED on 06/17/23 for 4-day history of SOB. CTA showed new L upper lobe PNA. Admitted for sepsis 2/2 PNA. PMH: hx of PE, COPD on 2L O2, pulmonary HTN, hx of seizures  Clinical Impression  Patient admitted with the above. PTA, patient lives alone in a first floor apartment and reports independence with no AD. Patient report intermittently using 2L O2 at home (50/50 use in a day). Patient presents with weakness, impaired balance, and decreased activity tolerance. Patient on 2L O2 throughout session. Required min guard for sit to stand and step pivot transfer with no AD. With minimal activity, spO2 drops to 86% with slow increase back to 90% with instruction on pursed lip breathing. Patient will benefit from skilled PT services during acute stay to address listed deficits. Patient will benefit from ongoing therapy at discharge to maximize functional independence and safety.         Assistance Recommended at Discharge Intermittent Supervision/Assistance  If plan is discharge home, recommend the following:  Can travel by private vehicle  A Carda help with walking and/or transfers;Assistance with cooking/housework;Assist for transportation;Help with stairs or ramp for entrance        Equipment Recommendations Rollator (4 wheels)  Recommendations for Other Services       Functional Status Assessment Patient has had a recent decline in their functional status and demonstrates the ability to make significant improvements in function in a reasonable and predictable amount of time.     Precautions / Restrictions Precautions Precautions: Fall Restrictions Weight Bearing Restrictions: No      Mobility  Bed Mobility Overal bed mobility: Modified Independent             General bed mobility comments: HOB elevated     Transfers Overall transfer level: Needs assistance Equipment used: None Transfers: Sit to/from Stand, Bed to chair/wheelchair/BSC Sit to Stand: Min guard   Step pivot transfers: Min guard       General transfer comment: min guard for safety. On 2L, spO2 dropped to 86% after transfer. Instructed patient on pursed lip breathing with slow increase to 90%    Ambulation/Gait                  Stairs            Wheelchair Mobility     Tilt Bed    Modified Rankin (Stroke Patients Only)       Balance Overall balance assessment: Mild deficits observed, not formally tested                                           Pertinent Vitals/Pain Pain Assessment Pain Assessment: Faces Faces Pain Scale: Hurts Teng more Pain Location: back. Pain Descriptors / Indicators: Sore Pain Intervention(s): Monitored during session    Home Living Family/patient expects to be discharged to:: Private residence Living Arrangements: Alone Available Help at Discharge: Family;Available PRN/intermittently Type of Home: Apartment Home Access: Level entry       Home Layout: One level Home Equipment: None      Prior Function Prior Level of Function : Independent/Modified Independent;Driving             Mobility Comments: ambulates with no AD       Hand  Dominance        Extremity/Trunk Assessment   Upper Extremity Assessment Upper Extremity Assessment: Defer to OT evaluation    Lower Extremity Assessment Lower Extremity Assessment: Generalized weakness    Cervical / Trunk Assessment Cervical / Trunk Assessment: Kyphotic  Communication   Communication: No difficulties  Cognition Arousal/Alertness: Awake/alert Behavior During Therapy: WFL for tasks assessed/performed Overall Cognitive Status: Within Functional Limits for tasks assessed                                          General Comments      Exercises      Assessment/Plan    PT Assessment Patient needs continued PT services  PT Problem List Decreased strength;Decreased activity tolerance;Decreased balance;Decreased mobility;Cardiopulmonary status limiting activity;Decreased knowledge of precautions;Decreased safety awareness       PT Treatment Interventions DME instruction;Gait training;Functional mobility training;Therapeutic activities;Therapeutic exercise;Balance training;Patient/family education    PT Goals (Current goals can be found in the Care Plan section)  Acute Rehab PT Goals Patient Stated Goal: to get stronger PT Goal Formulation: With patient Time For Goal Achievement: 07/03/23 Potential to Achieve Goals: Good    Frequency Min 1X/week     Co-evaluation               AM-PAC PT "6 Clicks" Mobility  Outcome Measure Help needed turning from your back to your side while in a flat bed without using bedrails?: None Help needed moving from lying on your back to sitting on the side of a flat bed without using bedrails?: None Help needed moving to and from a bed to a chair (including a wheelchair)?: A Bussa Help needed standing up from a chair using your arms (e.g., wheelchair or bedside chair)?: A Villavicencio Help needed to walk in hospital room?: A Ornstein Help needed climbing 3-5 steps with a railing? : A Sahni 6 Click Score: 20    End of Session Equipment Utilized During Treatment: Oxygen Activity Tolerance: Patient limited by fatigue Patient left: in chair;with call bell/phone within reach;with chair alarm set Nurse Communication: Mobility status PT Visit Diagnosis: Unsteadiness on feet (R26.81);Muscle weakness (generalized) (M62.81)    Time: 3295-1884 PT Time Calculation (min) (ACUTE ONLY): 22 min   Charges:   PT Evaluation $PT Eval Moderate Complexity: 1 Mod   PT General Charges $$ ACUTE PT VISIT: 1 Visit         Maylon Peppers, PT, DPT Physical Therapist - [facility]  [facility]   Juno Bozard A Markeya Mincy 06/19/2023, 10:07 AM

## 2023-06-19 NOTE — TOC Progression Note (Signed)
Transition of Care [facility]) - Progression Note    Patient Details  Name: Jorge Rosales MRN: [id number] Date of Birth: [birth year]  [facility] CM/SW Contact  Marlowe Sax, RN Phone Number: 06/19/2023, 2:12 PM  Clinical Narrative:     Spoke with the patient He has oxygen at home and does not remember the name of the company that provides it, He walks independently He stated that he would like PT at home, he does not have a preference of companies, I contacted Barbara Cower with [facility] and they accepted the patent Daughter will transport the patient  Expected Discharge Plan: Home w Home Health Services Barriers to Discharge: Continued Medical Work up  Expected Discharge Plan and Services   Discharge Planning Services: CM Consult   Living arrangements for the past 2 months: Single Family Home                 DME Arranged: N/A DME Agency: NA       HH Arranged: PT, OT HH Agency: [facility] Date HH Agency Contacted: 06/19/23 Time HH Agency Contacted: [id number] Representative spoke with at [facility] Agency: Barbara Cower   Social Determinants of Health (SDOH) Interventions SDOH Screenings   Food Insecurity: No Food Insecurity (06/18/2023)  Housing: Patient Declined (06/18/2023)  Transportation Needs: No Transportation Needs (06/18/2023)  Utilities: Not At Risk (06/18/2023)  Financial Resource Strain: Low Risk  (10/26/2022)   Received from [facility], [facility]  Tobacco Use: Medium Risk (06/17/2023)    Readmission Risk Interventions     No data to display

## 2023-06-19 NOTE — Evaluation (Signed)
Occupational Therapy Evaluation Patient Details Name: Jorge Rosales MRN: [id number] DOB: [birth year] Today's Date: 06/19/2023   History of Present Illness 77 y/o male presented to ED on 06/17/23 for 4-day history of SOB. CTA showed new L upper lobe PNA. Admitted for sepsis 2/2 PNA. PMH: hx of PE, COPD on 2L O2, pulmonary HTN, hx of seizures   Clinical Impression   Pt was seen for OT evaluation this date. Prior to hospital admission, pt was independent at home and in the community. Pt lives alone in an entry level apartment with family nearby. Pt presents to acute OT demonstrating impaired ADL performance and functional mobility 2/2 decreased activity tolerance, decreased steadiness while ambulating, and difficulty breathing (See OT problem list for additional functional deficits). Pt currently requires CGA for sit<>stand with RW in place. CGA for mobility 22ft. Pt was on 2l [place] throughout session. SpO2 started at 92% at rest - after ambulation 77% - 5 min of rest was back to 90%. Pt reported feeling fatigued and SOB. Pt would benefit from skilled OT services to address noted impairments and functional limitations (see below for any additional details) in order to maximize safety and independence while minimizing falls risk and caregiver burden. Anticipate the need for follow up OT services due to poor activity tolerance and living alone. Expecting progress.        Recommendations for follow up therapy are one component of a multi-disciplinary discharge planning process, led by the attending physician.  Recommendations may be updated based on patient status, additional functional criteria and insurance authorization.   Assistance Recommended at Discharge Intermittent Supervision/Assistance  Patient can return home with the following A Carnell help with walking and/or transfers;A Stamas help with bathing/dressing/bathroom;Assistance with cooking/housework;Assist for transportation    Functional Status  Assessment  Patient has had a recent decline in their functional status and demonstrates the ability to make significant improvements in function in a reasonable and predictable amount of time.  Equipment Recommendations  BSC/3in1    Recommendations for Other Services       Precautions / Restrictions Precautions Precautions: Fall Restrictions Weight Bearing Restrictions: No      Mobility Bed Mobility                    Transfers Overall transfer level: Needs assistance Equipment used: Rolling walker (2 wheels) Transfers: Sit to/from Stand Sit to Stand: Min guard     Step pivot transfers: Min guard     General transfer comment: CGA for safety with RW in place. Ambulated 63ft in hallway.      Balance                                           ADL either performed or assessed with clinical judgement   ADL Overall ADL's : Needs assistance/impaired                                     Functional mobility during ADLs: Min guard;Rolling walker (2 wheels) General ADL Comments: Anticipate supervision - set up assist for ADL completion due to decrease activity tolerance.     Vision         Perception     Praxis      Pertinent Vitals/Pain Pain Assessment Pain Assessment: Faces Faces Pain Scale: Hurts Raigoza  more Pain Location: back. Pain Descriptors / Indicators: Sore Pain Intervention(s): Monitored during session     Hand Dominance     Extremity/Trunk Assessment Upper Extremity Assessment Upper Extremity Assessment: Overall WFL for tasks assessed   Lower Extremity Assessment Lower Extremity Assessment: Generalized weakness       Communication Communication Communication: No difficulties   Cognition Arousal/Alertness: Awake/alert Behavior During Therapy: WFL for tasks assessed/performed Overall Cognitive Status: Within Functional Limits for tasks assessed                                        General Comments       Exercises     Shoulder Instructions      Home Living Family/patient expects to be discharged to:: Private residence Living Arrangements: Alone Available Help at Discharge: Family;Available PRN/intermittently Type of Home: Apartment Home Access: Level entry     Home Layout: One level     Bathroom Shower/Tub: Chief Strategy Officer: Standard Bathroom Accessibility: Yes How Accessible: Accessible via walker Home Equipment: Rolling Walker (2 wheels)          Prior Functioning/Environment Prior Level of Function : Independent/Modified Independent;Driving             Mobility Comments: ambulates with no AD          OT Problem List: Decreased strength;Decreased activity tolerance      OT Treatment/Interventions: Self-care/ADL training;Therapeutic exercise;Energy conservation;DME and/or AE instruction;Therapeutic activities;Patient/family education    OT Goals(Current goals can be found in the care plan section) Acute Rehab OT Goals Patient Stated Goal: To feel strong enough to go home OT Goal Formulation: With patient Time For Goal Achievement: 07/03/23 Potential to Achieve Goals: Good ADL Goals Pt Will Transfer to Toilet: with modified independence;ambulating Pt Will Perform Toileting - Clothing Manipulation and hygiene: with modified independence;sit to/from stand Pt Will Perform Tub/Shower Transfer: with modified independence;ambulating;rolling walker  OT Frequency: Min 1X/week    Co-evaluation              AM-PAC OT "6 Clicks" Daily Activity     Outcome Measure Help from another person eating meals?: A Siedschlag Help from another person taking care of personal grooming?: A Bartus Help from another person toileting, which includes using toliet, bedpan, or urinal?: A Schaad Help from another person bathing (including washing, rinsing, drying)?: A Daus Help from another person to put on and taking off regular upper  body clothing?: A Keener Help from another person to put on and taking off regular lower body clothing?: A Pellum 6 Click Score: 18   End of Session Equipment Utilized During Treatment: Rolling walker (2 wheels);Oxygen (2L [place])  Activity Tolerance: Patient limited by fatigue Patient left: in chair;with call bell/phone within reach;with chair alarm set  OT Visit Diagnosis: Muscle weakness (generalized) (M62.81)                Time: 1610-9604 OT Time Calculation (min): 26 min Charges:  OT General Charges $OT Visit: 1 Visit OT Evaluation $OT Eval Low Complexity: 1 Low OT Treatments $Self Care/Home Management : 8-22 mins Thresa Ross, OTS 06/19/2023, 3:17 PM

## 2023-06-20 ENCOUNTER — Encounter: Payer: Self-pay | Admitting: Internal Medicine

## 2023-06-20 ENCOUNTER — Inpatient Hospital Stay: Payer: Medicare Other

## 2023-06-20 DIAGNOSIS — J189 Pneumonia, unspecified organism: Secondary | ICD-10-CM | POA: Diagnosis not present

## 2023-06-20 LAB — BASIC METABOLIC PANEL
Anion gap: 7 (ref 5–15)
BUN: 10 mg/dL (ref 8–23)
CO2: 31 mmol/L (ref 22–32)
Calcium: 8.2 mg/dL — ABNORMAL LOW (ref 8.9–10.3)
Chloride: 100 mmol/L (ref 98–111)
Creatinine, Ser: 0.51 mg/dL — ABNORMAL LOW (ref 0.61–1.24)
GFR, Estimated: >60 mL/min (ref >60)
Glucose, Bld: 103 mg/dL — ABNORMAL HIGH (ref 70–99)
Potassium: 3.7 mmol/L (ref 3.5–5.1)
Sodium: 138 mmol/L (ref 135–145)

## 2023-06-20 LAB — CBC
HCT: 40.6 % (ref 39.0–52.0)
Hemoglobin: 13.3 g/dL (ref 13.0–17.0)
MCH: 31.4 pg (ref 26.0–34.0)
MCHC: 32.8 g/dL (ref 30.0–36.0)
MCV: 95.8 fL (ref 80.0–100.0)
Platelets: 403 10*3/uL — ABNORMAL HIGH (ref 150–400)
RBC: 4.24 MIL/uL (ref 4.22–5.81)
RDW: 12.8 % (ref 11.5–15.5)
WBC: 24 10*3/uL — ABNORMAL HIGH (ref 4.0–10.5)
nRBC: 0 % (ref 0.0–0.2)

## 2023-06-20 LAB — CULTURE, BLOOD (ROUTINE X 2): Culture: NO GROWTH

## 2023-06-20 LAB — CULTURE, RESPIRATORY W GRAM STAIN

## 2023-06-20 LAB — PROTIME-INR
INR: 1.9 — ABNORMAL HIGH (ref 0.8–1.2)
Prothrombin Time: 21.8 seconds — ABNORMAL HIGH (ref 11.4–15.2)

## 2023-06-20 MED ORDER — IOHEXOL 350 MG/ML SOLN
75.0000 mL | Freq: Once | INTRAVENOUS | Status: AC | PRN
  Administered 2023-06-20: 75 mL via INTRAVENOUS

## 2023-06-20 MED ORDER — HYDROXYZINE HCL 25 MG PO TABS: 25.0000 mg | ORAL_TABLET | Freq: Three times a day (TID) | ORAL | Status: DC | PRN

## 2023-06-20 MED ORDER — METHYLPREDNISOLONE SODIUM SUCC 125 MG IJ SOLR: 125.0000 mg | Freq: Once | INTRAMUSCULAR | Status: DC

## 2023-06-20 MED ORDER — ENOXAPARIN SODIUM 60 MG/0.6ML IJ SOSY
1.0000 mg/kg | PREFILLED_SYRINGE | Freq: Once | INTRAMUSCULAR | Status: AC
  Administered 2023-06-20: 57.5 mg via SUBCUTANEOUS
  Filled 2023-06-20: qty 0.6

## 2023-06-20 MED ORDER — TRANEXAMIC ACID FOR INHALATION
500.0000 mg | Freq: Once | RESPIRATORY_TRACT | Status: AC
  Administered 2023-06-20: 500 mg via RESPIRATORY_TRACT
  Filled 2023-06-20: qty 10

## 2023-06-20 MED ORDER — IPRATROPIUM-ALBUTEROL 0.5-2.5 (3) MG/3ML IN SOLN
3.0000 mL | Freq: Once | RESPIRATORY_TRACT | Status: AC
  Administered 2023-06-20: 3 mL via RESPIRATORY_TRACT
  Filled 2023-06-20: qty 3

## 2023-06-20 NOTE — Progress Notes (Signed)
PROGRESS NOTE    Jorge Rosales  ZOX:[id number] DOB: 1945/12/26 DOA: 06/17/2023 PCP: [facility]  Outpatient Specialists: cardiology, pulmonology    Brief Narrative:   Jorge Rosales is a 77 y.o. male with medical history significant for COPD on home O2 at 2 L, former smoker, pulmonary hypertension, PE in 2022 on warfarin anticoagulation, who presents to the ED with a 4-day history of shortness of breath and cough.  Denies fever, chills or chest pain ED course and data review: Tachycardic to 118 and tachypneic to 34 with otherwise normal vitals.  Labs significant for WBC of 32,000, lactic acid pending.  Sodium 128 EKG, Personally viewed and interpreted showing sinus tachycardia at 117 with no ischemic ST-T wave changes CT angio of the chest showing pneumonia as follows: New left upper lobe pneumonia superimposed over more chronic changes seen on prior exams. Patient given fluid bolus started on Rocephin and azithromycin and hospitalist consulted for admission.    Assessment & Plan:   Principal Problem:   Left upper lobe pneumonia Active Problems:   Sepsis (HCC)   Supratherapeutic INR   History of pulmonary embolism   COPD GOLD III   Pulmonary hypertension, unspecified (HCC)   Warfarin anticoagulation   CAP (community acquired pneumonia)   Hyponatremia   History of seizures   # Sepsis, severe By lactate, leukocytosis. 2/2 CAP. Sepsis physiology now resolved  # CAP LUL infiltrate on CT, no PE or signs heart failure, covid neg. Rvp neg. Pulm thinks there may be an undelrying malignancy. HIV neg. Strep antigen neg. - continue cefepime (severe copd) - f/u blood  and sputum cultures, ngtd - f/u legionella  # Debility PT now advising SNF - TOC working on placement  # Acute respiratory failure on chronic respiratory failure 2 liters at baseline here requiring 4, now weaned down to home 2 liters yesterday but acute respiratory distress back on 4 liters. CTA this  morning no PE, continues to have large upper lobe infiltrate - continue O2  # Supratherapeutic INR 12s on admission down to 1.9 today with 5 mg of po vitamin k. Continues to have occasional bloody sptutum, no other bleeding. This blood sputum is small in volume and is stable. Per RPH, tegretal interacts with all novel anticoagulants - monitor for bleeding - daily inr - will touch base w/ pulm about re-starting coumadin  # Hyponatremia 2/2 hypovolemia from sepsis as has resolved - monitor off fluids  # History DVT/PE Inr 1.9 today, received vit k on admission for supratherapeutic inr - daily INR - coumadin as above  # COPD CAP is primarily driving symptoms but will likely benefit from steroids - continue prednisone - duonebs q6  # Chronic pain - home duloxetine  # Seizure disorder? Per daughter had an episode in the 1980s thought to be seizure and has been on anti-seizure meds ever since, not followed by neurology, no further seizure-like activity - would plan on neurology referral at discharge, probably doesn't need to be on an anti-epileptic, and if does other options might be better including options that don't interfere with the novel anticoagulants - continue home tegretol for now - neuro referral at discharge (daughter would very much like this)  DVT prophylaxis: SCDs for now Code Status: full Family Communication: daughter updated telephonically 7/23  Level of care: Telemetry Medical Status is: Inpatient Remains inpatient appropriate because: severity of illness    Consultants:  none  Procedures: none  Antimicrobials:  Ceftriaxone/azithromycin>cefepime    Subjective: Still sob  with minimal activity  Objective: Vitals:   06/19/23 2325 06/20/23 0726 06/20/23 0904 06/20/23 1029  BP: 104/77  (!) 146/77   Pulse: 66  (!) 110 (!) 120  Resp: 20     Temp: 98 F (36.7 C)     TempSrc:      SpO2: 96% 95% 93% 93%  Weight:      Height:        Intake/Output  Summary (Last 24 hours) at 06/20/2023 1236 Last data filed at 06/20/2023 1610 Gross per 24 hour  Intake 200 ml  Output 950 ml  Net -750 ml   Filed Weights   06/18/23 0824  Weight: 57.9 kg    Examination:  General exam: Appears calm and comfortable  Respiratory system: mild tachypnea Cardiovascular system: rrr Gastrointestinal system: Abdomen is nondistended,   Central nervous system: Alert and oriented. No focal neurological deficits. Extremities: warm Skin: No rashes, lesions or ulcers Psychiatry: Judgement and insight appear normal. Mood & affect appropriate.     Data Reviewed: I have personally reviewed following labs and imaging studies  CBC: Recent Labs  Lab 06/17/23 1930 06/18/23 0753 06/19/23 0749 06/20/23 0652  WBC 32.4* 30.2* 26.2* 24.0*  NEUTROABS  --  27.8*  --   --   HGB 14.0 13.0 12.6* 13.3  HCT 40.9 38.9* 37.1* 40.6  MCV 92.1 94.2 93.2 95.8  PLT 566* 416* 569* 403*   Basic Metabolic Panel: Recent Labs  Lab 06/17/23 1930 06/18/23 0901 06/19/23 0749 06/20/23 0652  NA 128* 131* 134* 138  K 3.7 3.5 3.4* 3.7  CL 91* 96* 99 100  CO2 26 27 27 31   GLUCOSE 170* 123* 112* 103*  BUN 31* 20 13 10   CREATININE 0.91 0.59* 0.48* 0.51*  CALCIUM 8.1* 7.7* 8.0* 8.2*  MG  --  2.3  --   --   PHOS  --  2.7  --   --    GFR: Estimated Creatinine Clearance: 64.3 mL/min (A) (by C-G formula based on SCr of 0.51 mg/dL (L)). Liver Function Tests: Recent Labs  Lab 06/17/23 1930  AST 29  ALT 15  ALKPHOS 138*  BILITOT 0.6  PROT 7.3  ALBUMIN 2.7*   No results for input(s): "LIPASE", "AMYLASE" in the last 168 hours. No results for input(s): "AMMONIA" in the last 168 hours. Coagulation Profile: Recent Labs  Lab 06/17/23 2236 06/18/23 0531 06/19/23 0749 06/20/23 0652  INR 12.2* 11.4* 1.8* 1.9*   Cardiac Enzymes: No results for input(s): "CKTOTAL", "CKMB", "CKMBINDEX", "TROPONINI" in the last 168 hours. BNP (last 3 results) No results for input(s):  "PROBNP" in the last 8760 hours. HbA1C: No results for input(s): "HGBA1C" in the last 72 hours. CBG: No results for input(s): "GLUCAP" in the last 168 hours. Lipid Profile: No results for input(s): "CHOL", "HDL", "LDLCALC", "TRIG", "CHOLHDL", "LDLDIRECT" in the last 72 hours. Thyroid Function Tests: Recent Labs    06/18/23 0901  TSH 1.085   Anemia Panel: No results for input(s): "VITAMINB12", "FOLATE", "FERRITIN", "TIBC", "IRON", "RETICCTPCT" in the last 72 hours. Urine analysis: No results found for: "COLORURINE", "APPEARANCEUR", "LABSPEC", "PHURINE", "GLUCOSEU", "HGBUR", "BILIRUBINUR", "KETONESUR", "PROTEINUR", "UROBILINOGEN", "NITRITE", "LEUKOCYTESUR" Sepsis Labs: @LABRCNTIP (procalcitonin:4,lacticidven:4)  ) Recent Results (from the past 240 hour(s))  Blood culture (routine x 2)     Status: None (Preliminary result)   Collection Time: 06/17/23 10:36 PM   Specimen: BLOOD LEFT ARM  Result Value Ref Range Status   Specimen Description BLOOD LEFT ARM  Final   Special Requests  Final    BOTTLES DRAWN AEROBIC AND ANAEROBIC Blood Culture adequate volume   Culture   Final    NO GROWTH 3 DAYS Performed at [facility], [address] [place], [place] [address]    Report Status PENDING  Incomplete  Blood culture (routine x 2)     Status: None (Preliminary result)   Collection Time: 06/17/23 10:36 PM   Specimen: BLOOD RIGHT ARM  Result Value Ref Range Status   Specimen Description BLOOD RIGHT ARM  Final   Special Requests   Final    BOTTLES DRAWN AEROBIC AND ANAEROBIC Blood Culture adequate volume   Culture   Final    NO GROWTH 3 DAYS Performed at [facility], [address] [place], [place] [address]    Report Status PENDING  Incomplete  SARS Coronavirus 2 by RT PCR (hospital order, performed in [facility] hospital lab) *cepheid single result test* Anterior Nasal Swab     Status: None   Collection Time: 06/17/23 11:19 PM   Specimen: Anterior Nasal  Swab  Result Value Ref Range Status   SARS Coronavirus 2 by RT PCR NEGATIVE NEGATIVE Final    Comment: (NOTE) SARS-CoV-2 target nucleic acids are NOT DETECTED.  The SARS-CoV-2 RNA is generally detectable in upper and lower respiratory specimens during the acute phase of infection. The lowest concentration of SARS-CoV-2 viral copies this assay can detect is 250 copies / mL. A negative result does not preclude SARS-CoV-2 infection and should not be used as the sole basis for treatment or other patient management decisions.  A negative result may occur with improper specimen collection / handling, submission of specimen other than nasopharyngeal swab, presence of viral mutation(s) within the areas targeted by this assay, and inadequate number of viral copies (<250 copies / mL). A negative result must be combined with clinical observations, patient history, and epidemiological information.  Fact Sheet for Patients:   RoadLapTop.co.za  Fact Sheet for Healthcare Providers: http://kim-miller.com/  This test is not yet approved or  cleared by the [place] FDA and has been authorized for detection and/or diagnosis of SARS-CoV-2 by FDA under an Emergency Use Authorization (EUA).  This EUA will remain in effect (meaning this test can be used) for the duration of the COVID-19 declaration under Section 564(b)(1) of the Act, 21 U.S.C. section 360bbb-3(b)(1), unless the authorization is terminated or revoked sooner.  Performed at [facility], [address] [place], [place] [address]   Expectorated Sputum Assessment w Gram Stain, Rflx to Resp Cult     Status: None   Collection Time: 06/18/23 10:31 AM   Specimen: Sputum  Result Value Ref Range Status   Specimen Description SPUTUM  Final   Special Requests NONE  Final   Sputum evaluation   Final    THIS SPECIMEN IS ACCEPTABLE FOR SPUTUM CULTURE Performed at [facility],  [address] [place], [place] [address]    Report Status 06/18/2023 FINAL  Final  Respiratory (~20 pathogens) panel by PCR     Status: None   Collection Time: 06/18/23 10:31 AM   Specimen: Expectorated Sputum; Respiratory  Result Value Ref Range Status   Adenovirus NOT DETECTED NOT DETECTED Final   Coronavirus 229E NOT DETECTED NOT DETECTED Final    Comment: (NOTE) The Coronavirus on the Respiratory Panel, DOES NOT test for the novel  Coronavirus (2019 nCoV)    Coronavirus HKU1 NOT DETECTED NOT DETECTED Final   Coronavirus NL63 NOT DETECTED NOT DETECTED Final  Coronavirus OC43 NOT DETECTED NOT DETECTED Final   Metapneumovirus NOT DETECTED NOT DETECTED Final   Rhinovirus / Enterovirus NOT DETECTED NOT DETECTED Final   Influenza A NOT DETECTED NOT DETECTED Final   Influenza B NOT DETECTED NOT DETECTED Final   Parainfluenza Virus 1 NOT DETECTED NOT DETECTED Final   Parainfluenza Virus 2 NOT DETECTED NOT DETECTED Final   Parainfluenza Virus 3 NOT DETECTED NOT DETECTED Final   Parainfluenza Virus 4 NOT DETECTED NOT DETECTED Final   Respiratory Syncytial Virus NOT DETECTED NOT DETECTED Final   Bordetella pertussis NOT DETECTED NOT DETECTED Final   Bordetella Parapertussis NOT DETECTED NOT DETECTED Final   Chlamydophila pneumoniae NOT DETECTED NOT DETECTED Final   Mycoplasma pneumoniae NOT DETECTED NOT DETECTED Final    Comment: Performed at [facility], [address], [place], [place] [address]  Culture, Respiratory w Gram Stain     Status: None (Preliminary result)   Collection Time: 06/18/23 10:31 AM   Specimen: SPU  Result Value Ref Range Status   Specimen Description   Final    SPUTUM Performed at [facility], [address], [place], [place] [address]    Special Requests   Final    NONE Reflexed from [id number] Performed at [facility], [address] [place], [place] [address]    Gram Stain   Final    FEW WBC PRESENT,BOTH PMN AND  MONONUCLEAR RARE GRAM POSITIVE COCCI IN PAIRS    Culture   Final    TOO YOUNG TO READ Performed at [facility], [address] [place], [place] [address]    Report Status PENDING  Incomplete         Radiology Studies: CT Angio Chest Pulmonary Embolism (PE) W or WO Contrast  Result Date: 06/20/2023 CLINICAL DATA:  Dyspnea, tachypnea EXAM: CT ANGIOGRAPHY CHEST WITH CONTRAST TECHNIQUE: Multidetector CT imaging of the chest was performed using the standard protocol during bolus administration of intravenous contrast. Multiplanar CT image reconstructions and MIPs were obtained to evaluate the vascular anatomy. RADIATION DOSE REDUCTION: This exam was performed according to the departmental dose-optimization program which includes automated exposure control, adjustment of the mA and/or kV according to patient size and/or use of iterative reconstruction technique. CONTRAST:  75mL OMNIPAQUE IOHEXOL 350 MG/ML SOLN COMPARISON:  Previous CT done on 06/17/2023, chest radiograph done today FINDINGS: Cardiovascular: There is homogeneous enhancement in thoracic aorta. There are no intraluminal filling defects seen central pulmonary artery branches. Evaluation of small peripheral pulmonary artery branches is limited by motion artifacts. Scattered coronary artery calcifications are seen. Minimal pericardial effusion is seen. Mediastinum/Nodes: There are slightly enlarged lymph nodes in mediastinum with no interval change. Calcified nodes are noted in subcarinal region. Lungs/Pleura: There is large infiltrate in left upper lobe with no significant interval change. Small linear densities in right parahilar region have not changed. Small linear patchy infiltrate in left lower lobe has not changed. Small left pleural effusion is seen. Minimal right pleural effusion is seen. Upper Abdomen: There is reflux of contrast into hepatic veins suggesting possible tricuspid insufficiency. Musculoskeletal: No acute findings  are seen. Review of the MIP images confirms the above findings. IMPRESSION: There is no evidence of central pulmonary artery embolism. Evaluation of small peripheral branches is limited by motion artifacts. COPD. Large infiltrative left upper lobe has not changed significantly. Small linear infiltrate in the right parahilar region and small patchy infiltrate in left lower lobe have not changed significantly. There is interval appearance  of small left pleural effusion and minimal right pleural effusion. Electronically Signed   By: Ernie Avena M.D.   On: 06/20/2023 10:27   DG Chest Port 1 View  Result Date: 06/20/2023 CLINICAL DATA:  77 year old male with history of shortness of breath and wheezing. EXAM: PORTABLE CHEST 1 VIEW COMPARISON:  Chest x-ray 06/18/2023. FINDINGS: Extensive airspace consolidation in the left upper lobe again noted. Patchy areas of interstitial prominence and peribronchial cuffing noted elsewhere throughout the lungs bilaterally, similar to the prior study. No pleural effusions. No pneumothorax. No evidence of pulmonary edema. Heart size is normal. The patient is rotated to the right on today's exam, resulting in distortion of the mediastinal contours and reduced diagnostic sensitivity and specificity for mediastinal pathology. IMPRESSION: 1. Persistent left upper lobe pneumonia. 2. Diffuse interstitial prominence and peribronchial cuffing which suggests a background of bronchitis. Electronically Signed   By: Trudie Reed M.D.   On: 06/20/2023 07:45        Scheduled Meds:  carbamazepine  200 mg Oral TID   DULoxetine  30 mg Oral Daily   fluticasone furoate-vilanterol  1 puff Inhalation Daily   ipratropium-albuterol  3 mL Nebulization BID   predniSONE  40 mg Oral Q breakfast   Continuous Infusions:  ceFEPime (MAXIPIME) IV 2 g (06/20/23 0904)     LOS: 3 days     Silvano Bilis, MD [facility]   If 7PM-7AM, please contact  night-coverage [email or web address] Password [facility] 06/20/2023, 12:36 PM   \

## 2023-06-20 NOTE — TOC Progression Note (Signed)
Transition of Care [facility]) - Progression Note    Patient Details  Name: Jorge Rosales MRN: [id number] Date of Birth: January 30, 1946  Transition of Care [facility]) CM/SW Contact  Marlowe Sax, RN Phone Number: 06/20/2023, 4:12 PM  Clinical Narrative:    Glennon Mac Obtained [phone] [id number]   Expected Discharge Plan: Home w Home Health Services Barriers to Discharge: Continued Medical Work up  Expected Discharge Plan and Services   Discharge Planning Services: CM Consult   Living arrangements for the past 2 months: Single Family Home                 DME Arranged: N/A DME Agency: NA       HH Arranged: PT, OT HH Agency: [facility] Date HH Agency Contacted: 06/19/23 Time HH Agency Contacted: 1411 Representative spoke with at [facility] Agency: Barbara Cower   Social Determinants of Health (SDOH) Interventions SDOH Screenings   Food Insecurity: No Food Insecurity (06/18/2023)  Housing: Patient Declined (06/18/2023)  Transportation Needs: No Transportation Needs (06/18/2023)  Utilities: Not At Risk (06/18/2023)  Financial Resource Strain: Low Risk  (10/26/2022)   Received from [facility], [facility]  Tobacco Use: Medium Risk (06/17/2023)    Readmission Risk Interventions     No data to display

## 2023-06-20 NOTE — TOC Progression Note (Addendum)
Transition of Care [facility]) - Progression Note    Patient Details  Name: Jorge Rosales MRN: [id number] Date of Birth: 12-Feb-1946  Transition of Care [facility]) CM/SW Contact  Marlowe Sax, RN Phone Number: 06/20/2023, 11:45 AM  Clinical Narrative:    Spoke with the patient and his daughter in the room, Recommendation has changed to go to [facility], he is in agreement, They would like to go to [facility] [employer] and requested to review for offer.    Expected Discharge Plan: Home w Home Health Services Barriers to Discharge: Continued Medical Work up  Expected Discharge Plan and Services   Discharge Planning Services: CM Consult   Living arrangements for the past 2 months: Single Family Home                 DME Arranged: N/A DME Agency: NA       HH Arranged: PT, OT HH Agency: [facility] Date HH Agency Contacted: 06/19/23 Time HH Agency Contacted: 1411 Representative spoke with at [facility] Agency: Barbara Cower   Social Determinants of Health (SDOH) Interventions SDOH Screenings   Food Insecurity: No Food Insecurity (06/18/2023)  Housing: Patient Declined (06/18/2023)  Transportation Needs: No Transportation Needs (06/18/2023)  Utilities: Not At Risk (06/18/2023)  Financial Resource Strain: Low Risk  (10/26/2022)   Received from [facility], [facility]  Tobacco Use: Medium Risk (06/17/2023)    Readmission Risk Interventions     No data to display

## 2023-06-20 NOTE — Progress Notes (Signed)
PULMONOLOGY         Date: 06/20/2023,   MRN# [id number] Jorge Rosales [birth year]     AdmissionWeight: 57.9 kg                 CurrentWeight: 57.9 kg  Referring provider: Dr Ashok Pall   CHIEF COMPLAINT:   Non massive hemoptysis   HISTORY OF PRESENT ILLNESS    Jorge Rosales is a 77 y.o. male with medical history significant for COPD on home O2 at 2 L, former smoker, pulmonary hypertension, PE in 2022 on warfarin anticoagulation, who presents to the ED with a 4-day history of shortness of breath and cough.  Denies fever, chills or chest pain ED course and data review: Tachycardic to 118 and tachypneic to 34 with otherwise normal vitals.  Labs significant for WBC of 32,000, lactic acid pending.  Sodium 128 EKG, Personally viewed and interpreted showing sinus tachycardia at 117 with no ischemic ST-T wave changes CT angio of the chest showing pneumonia as follows:  He sees Pulmonary and has appt with [facility].  Daughter is at bedside and shares he has not followed up and has not had lung cancer screening study.  He has chronic mucus production.    06/20/23- patient had acute episode of respiratory distress this morning.  He was attempting to use bathroom and ambulated resulting in hypoxemia <80% with inrcreased O2 req to 6L/min.  After examination and appx 10 min rest period his O2 saturation has increased to >98% and he should be able to wean down again. He does have some wornseing wheezing on left and we have ordered CXR for this am.   He has stopped hemoptysizing and WBC count has improved consistently.  He has solumedrol and prednisone ordered , have dcd solumedrol this am. Plan to continue cefepime and pred taper currently at 40mg .     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  On long-term warfarin   Malignant neoplasm of skin    Osteoarthritis of both knees    Seizure disorder (HCC) 1982   He had a "drop attack clinical diagnosed with seizure disorder.  Started on Tegretol.  No further episodes.     SURGICAL HISTORY   Past Surgical History:  Procedure Laterality Date   APPENDECTOMY     BIL hip replacement  11/29/2011   BOWEL RESECTION  11/28/1965   INGUINAL HERNIA REPAIR Bilateral      FAMILY HISTORY   Family History  Problem Relation Age of Onset   Heart disease Father    Lung cancer Father      SOCIAL HISTORY   Social History   Tobacco Use   Smoking status: Former    Current packs/day: 1.00    Average packs/day: 1 pack/day for 58.6 years (58.6 ttl pk-yrs)    Types: Cigarettes    Start date: 11/28/1964  Vaping Use   Vaping status: Never Used  Substance Use Topics   Alcohol use: Yes    Comment: 2 beers/day   Drug use: Never     MEDICATIONS  Home Medication:    Current Medication:  Current Facility-Administered Medications:    acetaminophen (TYLENOL) tablet 650 mg, 650 mg, Oral, Q6H PRN **OR** acetaminophen (TYLENOL) suppository 650 mg, 650 mg, Rectal, Q6H PRN, Andris Baumann, MD   carbamazepine (TEGRETOL) tablet 200 mg, 200 mg, Oral, TID, Andris Baumann, MD, 200 mg at 06/19/23 2140   ceFEPIme (MAXIPIME) 2 g in sodium chloride 0.9 % 100 mL IVPB, 2 g, Intravenous, Q8H, Wouk, Wilfred Curtis, MD, Last Rate: 200 mL/hr at 06/20/23 0256, 2 g at 06/20/23 0256   DULoxetine (CYMBALTA) DR capsule 30 mg, 30 mg, Oral, Daily, Angelique Blonder, RPH, 30 mg at 06/19/23 1007   enoxaparin (LOVENOX) injection 57.5 mg, 1 mg/kg, Subcutaneous, Once, Lindajo Royal V, MD   fluticasone furoate-vilanterol (BREO ELLIPTA) 100-25 MCG/ACT 1 puff, 1 puff,  Inhalation, Daily, 1 puff at 06/19/23 0753 **AND** [DISCONTINUED] umeclidinium bromide (INCRUSE ELLIPTA) 62.5 MCG/ACT 1 puff, 1 puff, Inhalation, Daily, Lindajo Royal V, MD, 1 puff at 06/18/23 1109   ipratropium-albuterol (DUONEB) 0.5-2.5 (3) MG/3ML nebulizer solution 3 mL, 3 mL, Nebulization, BID, Wouk, Wilfred Curtis, MD, 3 mL at 06/19/23 2042   ipratropium-albuterol (DUONEB) 0.5-2.5 (3) MG/3ML nebulizer solution 3 mL, 3 mL, Nebulization, Q6H PRN, Wouk, Wilfred Curtis, MD, 3 mL at 06/20/23 4034   ipratropium-albuterol (DUONEB) 0.5-2.5 (3) MG/3ML nebulizer solution 3 mL, 3 mL, Nebulization, Once, Andris Baumann, MD   methylPREDNISolone sodium succinate (SOLU-MEDROL) 125 mg/2 mL injection 125 mg, 125 mg, Intravenous, Once, Andris Baumann, MD   ondansetron [facility]) tablet 4 mg, 4 mg, Oral, Q6H PRN **OR** ondansetron (ZOFRAN) injection 4 mg, 4 mg, Intravenous, Q6H PRN, Andris Baumann, MD   predniSONE (DELTASONE) tablet 40 mg, 40 mg, Oral, Q breakfast, Wouk, Wilfred Curtis, MD, 40 mg at 06/19/23 1125   traZODone (DESYREL) tablet 50 mg, 50 mg, Oral, QHS PRN, Ashok Pall, Wilfred Curtis, MD, 50 mg at 06/19/23 2145    ALLERGIES   Wound dressing adhesive and Latex     REVIEW OF SYSTEMS    Review of Systems:  Gen:  Denies  fever, sweats, chills weigh loss  HEENT: Denies blurred vision, double vision, ear pain, eye pain, hearing loss, nose bleeds, sore throat Cardiac:  No dizziness, chest pain or heaviness, chest tightness,edema Resp:   reports dyspnea chronically  Gi: Denies swallowing difficulty, stomach pain, nausea or vomiting, diarrhea, constipation, bowel incontinence Gu:  Denies bladder incontinence, burning urine Ext:   Denies Joint pain, stiffness or swelling Skin: Denies  skin rash, easy bruising or bleeding or hives Endoc:  Denies polyuria, polydipsia , polyphagia or weight change Psych:   Denies depression, insomnia or hallucinations   Other:  All other systems negative   VS: BP 104/77  (BP Location: Left Arm)   Pulse 66   Temp 98 F (36.7 C)   Resp 20   Ht 5\' 10"  (1.778 m)   Wt 57.9 kg   SpO2 96%   BMI 18.32 kg/m      PHYSICAL EXAM    GENERAL:NAD, no fevers, chills, no weakness no fatigue HEAD: Normocephalic, atraumatic.  EYES: Pupils equal, round, reactive to light. Extraocular muscles intact. No scleral icterus.  MOUTH: Moist mucosal membrane. Dentition intact. No abscess noted.  EAR, NOSE, THROAT: Clear without exudates. No external lesions.  NECK: Supple. No thyromegaly. No nodules. No JVD.  PULMONARY: decreased breath sounds with mild rhonchi worse at bases bilaterally.  CARDIOVASCULAR: S1 and S2. Regular rate and rhythm. No murmurs, rubs, or gallops. No  edema. Pedal pulses 2+ bilaterally.  GASTROINTESTINAL: Soft, nontender, nondistended. No masses. Positive bowel sounds. No hepatosplenomegaly.  MUSCULOSKELETAL: No swelling, clubbing, or edema. Range of motion full in all extremities.  NEUROLOGIC: Cranial nerves II through XII are intact. No gross focal neurological deficits. Sensation intact. Reflexes intact.  SKIN: No ulceration, lesions, rashes, or cyanosis. Skin warm and dry. Turgor intact.  PSYCHIATRIC: Mood, affect within normal limits. The patient is awake, alert and oriented x 3. Insight, judgment intact.       IMAGING     CLINICAL DATA:  Shortness of breath for several days, initial encounter   EXAM: CT ANGIOGRAPHY CHEST WITH CONTRAST   TECHNIQUE: Multidetector CT imaging of the chest was performed using the standard protocol during bolus administration of intravenous contrast. Multiplanar CT image reconstructions and MIPs were obtained to evaluate the vascular anatomy.   RADIATION DOSE REDUCTION: This exam was performed according to the departmental dose-optimization program which includes automated exposure control, adjustment of the mA and/or kV according to patient size and/or use of iterative reconstruction technique.    CONTRAST:  75mL OMNIPAQUE IOHEXOL 350 MG/ML SOLN   COMPARISON:  Plain film from earlier in the same day.   FINDINGS: Cardiovascular: Atherosclerotic calcifications of the thoracic aorta are noted. No aneurysmal dilatation or dissection is seen. Heart is not significantly enlarged in size. Pulmonary artery shows a normal branching pattern bilaterally with the exception of the upper lobe on the left which demonstrates some attenuation related to the underlying scarring. No filling defects to suggest pulmonary emboli are noted.   Mediastinum/Nodes: Thoracic inlet is within normal limits. Scattered small mediastinal lymph nodes are noted likely reactive in nature. Calcified nodes are noted within the mediastinum and left hilum similar to that seen on prior exam. The esophagus as visualized is within normal limits.   Lungs/Pleura: Right lung demonstrates diffuse emphysematous changes. No focal infiltrate or effusion is seen. Some scarring in the posterolateral right upper lobe is seen stable from the prior exam. The left lower lobe demonstrates emphysematous changes. Calcified granuloma is noted within the lingula. Extensive acute infiltrate is noted within the left upper lobe superimposed over more chronic areas of scarring. No sizable effusion is seen.   Upper Abdomen: Visualized upper abdomen is within normal limits.   Musculoskeletal: Degenerative changes of the thoracic spine are noted.   Review of the MIP images confirms the above findings.   IMPRESSION: No evidence of pulmonary emboli.   New left upper lobe pneumonia superimposed over more chronic changes seen on prior exams.   Changes consistent with prior granulomatous disease.   Aortic Atherosclerosis (ICD10-I70.0) and Emphysema (ICD10-J43.9).     Electronically Signed   By: Alcide Clever M.D.   On: 06/17/2023 20:57    ASSESSMENT/PLAN   Left upper lobe consolidated pneumonia  - There appears to be possible  post obstructive process and may be related to malignancy  - there is a background of centrilobular and paraseptal emphysema and bronchitic COPD with segments of fibrosis.   - patient is on [place]  - he has mMCR 4 and is dyspneic with using bathroom and ambulating to bathroom  - he fell and broke few ribs 2 months ago, states he tripped over a box  -currently on cefepime-gram + cocci in chain on resp cultures suggestive of strep infection    - resp cultures and RVP is negative thus far    Non massive hemoptysis    - likely related to supratheraputic INR and  consolidated pneumonia   - noted correction of coagulopathy with vitamin K             Thank you for allowing me to participate in the care of this patient.   Patient/Family are satisfied with care plan and all questions have been answered.    Provider disclosure: Patient with at least one acute or chronic illness or injury that poses a threat to life or bodily function and is being managed actively during this encounter.  All of the below services have been performed independently by signing provider:  review of prior documentation from internal and or external health records.  Review of previous and current lab results.  Interview and comprehensive assessment during patient visit today. Review of current and previous chest radiographs/CT scans. Discussion of management and test interpretation with health care team and patient/family.   This document was prepared using Dragon voice recognition software and may include unintentional dictation errors.     Vida Rigger, M.D.  Division of Pulmonary & Critical Care Medicine

## 2023-06-20 NOTE — Progress Notes (Signed)
Occupational Therapy Treatment Patient Details Name: Jorge Rosales MRN: [id number] DOB: [birth year] Today's Date: 06/20/2023   History of present illness 77 y/o male presented to ED on 06/17/23 for 4-day history of SOB. CTA showed new L upper lobe PNA. Admitted for sepsis 2/2 PNA. PMH: hx of PE, COPD on 2L O2, pulmonary HTN, hx of seizures   OT comments  Pt seen for OT treatment on this date. Upon arrival to room pt resting in bed, agreeable to tx. Pt requires supervision for bed mobility. CGA for sit<>stand X2. SpO2 and HR monitored throughout session. Pt desated to 86% with HR of 115bpm after standing EOB. Pt required a 3-4 minute rest break before SpO2 returned to 92% and HR 105bpm. Pt educated on SpO2 levels and was able to acknowledge symptoms that matched monitor levels. Pt making good progress toward goals, will continue to follow POC. Discharge recommendation remains appropriate.     Recommendations for follow up therapy are one component of a multi-disciplinary discharge planning process, led by the attending physician.  Recommendations may be updated based on patient status, additional functional criteria and insurance authorization.    Assistance Recommended at Discharge Intermittent Supervision/Assistance  Patient can return home with the following  A Sultan help with walking and/or transfers;A Engdahl help with bathing/dressing/bathroom;Assistance with cooking/housework;Assist for transportation   Equipment Recommendations  BSC/3in1    Recommendations for Other Services      Precautions / Restrictions Precautions Precautions: Fall Restrictions Weight Bearing Restrictions: No       Mobility Bed Mobility Overal bed mobility: Needs Assistance Bed Mobility: Supine to Sit, Sit to Supine     Supine to sit: Supervision     General bed mobility comments: Pt able to get self in and out of  bed    Transfers Overall transfer level: Needs assistance   Transfers: Sit to/from  Stand Sit to Stand: Min guard                 Balance                                           ADL either performed or assessed with clinical judgement   ADL Overall ADL's : Needs assistance/impaired                                     Functional mobility during ADLs: Min guard (sit<>stand)      Extremity/Trunk Assessment Upper Extremity Assessment Upper Extremity Assessment: Generalized weakness   Lower Extremity Assessment Lower Extremity Assessment: Generalized weakness        Vision       Perception     Praxis      Cognition Arousal/Alertness: Awake/alert Behavior During Therapy: WFL for tasks assessed/performed Overall Cognitive Status: Within Functional Limits for tasks assessed                                          Exercises      Shoulder Instructions       General Comments 4l [place] - Resting HR 105 SpO2 96% - after bout of standing for 1 min EOB HR 115, SpO2 86 - after 3-4 minutes of rest HR 110 , SpO2  92%    Pertinent Vitals/ Pain       Pain Assessment Pain Assessment: No/denies pain  Home Living                                          Prior Functioning/Environment              Frequency  Min 1X/week        Progress Toward Goals  OT Goals(current goals can now be found in the care plan section)  Progress towards OT goals: Progressing toward goals  Acute Rehab OT Goals Patient Stated Goal: To feel strong enough to go home OT Goal Formulation: With patient Time For Goal Achievement: 07/03/23 Potential to Achieve Goals: Good ADL Goals Pt Will Transfer to Toilet: with modified independence;ambulating Pt Will Perform Toileting - Clothing Manipulation and hygiene: with modified independence;sit to/from stand Pt Will Perform Tub/Shower Transfer: with modified independence;ambulating;rolling walker  Plan Discharge plan remains appropriate     Co-evaluation                 AM-PAC OT "6 Clicks" Daily Activity     Outcome Measure   Help from another person eating meals?: A Rusher Help from another person taking care of personal grooming?: A Gaillard Help from another person toileting, which includes using toliet, bedpan, or urinal?: A Plancarte Help from another person bathing (including washing, rinsing, drying)?: A Nevins Help from another person to put on and taking off regular upper body clothing?: A Sadek Help from another person to put on and taking off regular lower body clothing?: A Rho 6 Click Score: 18    End of Session Equipment Utilized During Treatment: Gait belt;Oxygen  OT Visit Diagnosis: Muscle weakness (generalized) (M62.81)   Activity Tolerance Patient limited by fatigue   Patient Left with call bell/phone within reach;in bed;with bed alarm set   Nurse Communication          Time: 1326-1350 OT Time Calculation (min): 24 min  Charges: OT General Charges $OT Visit: 1 Visit OT Treatments $Self Care/Home Management : 8-22 mins $Therapeutic Exercise: 8-22 mins Thresa Ross, OTS  06/20/2023, 3:23 PM

## 2023-06-20 NOTE — Progress Notes (Signed)
Pt called out c/o sob.  02 sats 83% on 3L, cough, congestion and use of accessory muscles noted.  Pt administered breathing tx. 02 sats increased to 98%.  Pt placed back on the 3L of 02 after breathing tx and 02 sats remain in the mid 90's.  Pt reports feeling better; will continue to monitor and assess.

## 2023-06-20 NOTE — Plan of Care (Signed)

## 2023-06-20 NOTE — NC FL2 (Signed)
[place] MEDICAID [id number] LEVEL OF CARE FORM     IDENTIFICATION  Patient Name: Jorge Rosales Birthdate: 1946/06/05 Sex: male Admission Date (Current Location): 06/17/2023  [facility] and [place] Number:  Chiropodist and Address:  [facility], [address], [place], [place] [address]      Provider Number: [id number]  Attending Physician Name and Address:  Kathrynn Running, MD  Relative Name and Phone Number:       Current Level of Care: Hospital Recommended Level of Care: Skilled Nursing Facility Prior Approval Number:    Date Approved/Denied:   PASRR Number: Manual review  Discharge Plan: SNF    Current Diagnoses: Patient Active Problem List   Diagnosis Date Noted   Supratherapeutic INR 06/18/2023   Hyponatremia 06/18/2023   History of seizures 06/18/2023   History of pulmonary embolism 06/17/2023   Warfarin anticoagulation 06/17/2023   Left upper lobe pneumonia 06/17/2023   Sepsis (HCC) 06/17/2023   CAP (community acquired pneumonia) 06/17/2023   Bradycardia by electrocardiogram 01/23/2023   Pulmonary hypertension, unspecified (HCC) 01/23/2023   Nonspecific abnormal electrocardiogram (ECG) (EKG) 01/23/2023   Acute pulmonary embolism (HCC) 05/16/2021   Pulmonary embolism (HCC) 05/16/2021   Pulmonary cavitary lesion 04/11/2014   Smoker 01/23/2014   Solitary pulmonary nodule 01/23/2014   COPD GOLD III 01/22/2014   Seizure disorder (HCC) 06/08/2009    Orientation RESPIRATION BLADDER Height & Weight     Self, Time, Situation, Place  O2 (Nasal Cannula 4 L) Continent Weight: 127 lb 10.3 oz (57.9 kg) Height:  5\' 10"  (177.8 cm)  BEHAVIORAL SYMPTOMS/MOOD NEUROLOGICAL BOWEL NUTRITION STATUS   (None)  (History of seizure. Last one in 1980's per H&P.) Continent Diet (Heart healthy)  AMBULATORY STATUS COMMUNICATION OF NEEDS Skin   Extensive Assist Verbally Normal                       Personal Care Assistance Level of Assistance   Bathing, Feeding, Dressing Bathing Assistance: Limited assistance Feeding assistance: Limited assistance Dressing Assistance: Limited assistance     Functional Limitations Info  Sight, Hearing, Speech Sight Info: Adequate Hearing Info: Adequate Speech Info: Adequate    SPECIAL CARE FACTORS FREQUENCY  PT (By licensed PT), OT (By licensed OT)     PT Frequency: 5 x week OT Frequency: 5 x week            Contractures Contractures Info: Not present    Additional Factors Info  Code Status, Allergies Code Status Info: Full code Allergies Info: Wound Dressing Adhesive, Latex           Current Medications (06/20/2023):  This is the current hospital active medication list Current Facility-Administered Medications  Medication Dose Route Frequency Provider Last Rate Last Admin   acetaminophen (TYLENOL) tablet 650 mg  650 mg Oral Q6H PRN Andris Baumann, MD       Or   acetaminophen (TYLENOL) suppository 650 mg  650 mg Rectal Q6H PRN Andris Baumann, MD       carbamazepine (TEGRETOL) tablet 200 mg  200 mg Oral TID Lindajo Royal V, MD   200 mg at 06/20/23 0858   ceFEPIme (MAXIPIME) 2 g in sodium chloride 0.9 % 100 mL IVPB  2 g Intravenous Q8H Wouk, Wilfred Curtis, MD 200 mL/hr at 06/20/23 0904 2 g at 06/20/23 0904   DULoxetine (CYMBALTA) DR capsule 30 mg  30 mg Oral Daily Bari Mantis A, RPH   30 mg at 06/20/23 [id number]  fluticasone furoate-vilanterol (BREO ELLIPTA) 100-25 MCG/ACT 1 puff  1 puff Inhalation Daily Lindajo Royal V, MD   1 puff at 06/20/23 0856   ipratropium-albuterol (DUONEB) 0.5-2.5 (3) MG/3ML nebulizer solution 3 mL  3 mL Nebulization BID Kathrynn Running, MD   3 mL at 06/20/23 0726   ipratropium-albuterol (DUONEB) 0.5-2.5 (3) MG/3ML nebulizer solution 3 mL  3 mL Nebulization Q6H PRN Kathrynn Running, MD   3 mL at 06/20/23 0620   ondansetron (ZOFRAN) tablet 4 mg  4 mg Oral Q6H PRN Andris Baumann, MD       Or   ondansetron [facility]) injection 4 mg  4 mg Intravenous  Q6H PRN Andris Baumann, MD       predniSONE (DELTASONE) tablet 40 mg  40 mg Oral Q breakfast Kathrynn Running, MD   40 mg at 06/20/23 0856   traZODone (DESYREL) tablet 50 mg  50 mg Oral QHS PRN Kathrynn Running, MD   50 mg at 06/19/23 2145     Discharge Medications: Please see discharge summary for a list of discharge medications.  Relevant Imaging Results:  Relevant Lab Results:   Additional Information SS#: [id number]  Margarito Liner, LCSW

## 2023-06-20 NOTE — TOC CM/SW Note (Signed)
RE: Jorge Rosales Date of Birth: 30-Dec-1945 Date: 06/20/2023   To Whom It May Concern:  Please be advised that the above-named patient will require a short-term nursing home stay - anticipated 30 days or less for rehabilitation and strengthening.  The plan is for return home.

## 2023-06-20 NOTE — Care Management Important Message (Signed)
Important Message  Patient Details  Name: Jorge Rosales MRN: [id number] Date of Birth: 10/18/1946   Medicare Important Message Given:  N/A - LOS <3 / Initial given by admissions     Olegario Messier A Janine Reller 06/20/2023, 8:59 AM

## 2023-06-20 NOTE — Progress Notes (Signed)
Physical Therapy Treatment Patient Details Name: Jorge Rosales MRN: [id number] DOB: 1946-10-07 Today's Date: 06/20/2023   History of Present Illness 77 y/o male presented to ED on 06/17/23 for 4-day history of SOB. CTA showed new L upper lobe PNA. Admitted for sepsis 2/2 PNA. PMH: hx of PE, COPD on 2L O2, pulmonary HTN, hx of seizures    PT Comments  Pt in bed on arrival, family at bedside. Pt just returned to floor from CT. Pt reports controlled dyspnea at rest, HOB at 30 degrees, O2 on 3-4L in session. Left hand continuous oximetery limited by cold fingers and intermittent signal loss- able to obtain a reading on RUE with author's oximeter. Pt c resting tachycardia in low 100s bpm, which increases 110s at EOB with dizziness. Upon standing attempt, pt has worse dyspnea and anxiety, asks to sit before 60 seconds, HR already up to 120bpm- sats remain >90%, albiet author increases flow rate by 1L/min for activity. BP assessment taken at EOB, pressures 130s/100smmHg. Pt not comfortable about attempting xfer to chair (may not allow for full HR recovery based on session), but he is interested in sitting EOB for a bit. Pt/family instructed to call for assist if help is needed back to supine. Will continue to follow. At this point, pt unable to AMB 2 consecutive days 2/2 easily triggered dyspnea, discussed with pt the option of more extensive rehab options at DC to fully restore to baseline and allow for safest eventual return home.     Assistance Recommended at Discharge Intermittent Supervision/Assistance  If plan is discharge home, recommend the following:  Can travel by private vehicle    A [place] help with walking and/or transfers;Assistance with cooking/housework;Assist for transportation;Help with stairs or ramp for entrance;A [place] help with bathing/dressing/bathroom   No  Equipment Recommendations  Rollator (4 wheels)    Recommendations for Other Services       Precautions / Restrictions  Precautions Precautions: Fall Restrictions Weight Bearing Restrictions: No     Mobility  Bed Mobility Overal bed mobility: Needs Assistance Bed Mobility: Supine to Sit     Supine to sit: Supervision     General bed mobility comments: HOB elevated; dizziness worse at EOB but stable; sats at 93% per authors oximeter; continuous limited by poor signal and cold fingers    Transfers Overall transfer level: Needs assistance Equipment used: Rolling walker (2 wheels) Transfers: Sit to/from Stand             General transfer comment: moderate effort; dyspnea becomes more anxiety provoking within 60sec    Ambulation/Gait                   Stairs             Wheelchair Mobility     Tilt Bed    Modified Rankin (Stroke Patients Only)       Balance Overall balance assessment: Mild deficits observed, not formally tested                                          Cognition Arousal/Alertness: Awake/alert Behavior During Therapy: Anxious, WFL for tasks assessed/performed Overall Cognitive Status: Within Functional Limits for tasks assessed  Exercises      General Comments        Pertinent Vitals/Pain Pain Assessment Pain Assessment: No/denies pain    Home Living                          Prior Function            PT Goals (current goals can now be found in the care plan section) Acute Rehab PT Goals Patient Stated Goal: to get stronger PT Goal Formulation: With patient/family Time For Goal Achievement: 07/03/23 Potential to Achieve Goals: Good Progress towards PT goals: Not progressing toward goals - comment    Frequency    Min 1X/week      PT Plan Discharge plan needs to be updated    Co-evaluation              AM-PAC PT "6 Clicks" Mobility   Outcome Measure  Help needed turning from your back to your side while in a flat bed without  using bedrails?: None Help needed moving from lying on your back to sitting on the side of a flat bed without using bedrails?: None Help needed moving to and from a bed to a chair (including a wheelchair)?: A [place] Help needed standing up from a chair using your arms (e.g., wheelchair or bedside chair)?: A [place] Help needed to walk in hospital room?: A Lot Help needed climbing 3-5 steps with a railing? : A Lot 6 Click Score: 18    End of Session Equipment Utilized During Treatment: Oxygen Activity Tolerance: Treatment limited secondary to medical complications (Comment) Patient left: in bed;with family/visitor present;with call bell/phone within reach Nurse Communication: Mobility status PT Visit Diagnosis: Unsteadiness on feet (R26.81);Muscle weakness (generalized) (M62.81)     Time: 9604-5409 PT Time Calculation (min) (ACUTE ONLY): 12 min  Charges:    $Therapeutic Activity: 8-22 mins PT General Charges $$ ACUTE PT VISIT: 1 Visit                    10:38 AM, 06/20/23 Rosamaria Lints, PT, DPT Physical Therapist - [facility]  [phone] (ASCOM)    Kynslie Ringle C 06/20/2023, 10:33 AM

## 2023-06-20 NOTE — Progress Notes (Signed)
Pt's 02 sats decreased back down to low 80's on 3L.  02 increased to 6L.  Dr. Para March notified; pt assessed by MD at bedside.

## 2023-06-20 NOTE — Progress Notes (Addendum)
       CROSS COVER NOTE  NAME: Jorge Rosales MRN: [id number] DOB : 04/30/1946    Concern as stated by nurse / staff   Patient with acute dyspnea onset after he returned from the bathroom, tachypneic, desaturations to the 80s on O2 at 2 L needing increase in O2 to 6 L to keep sats around the mid 90s Patient was administered a DuoNeb treatment without improvement     Pertinent findings on chart review: H&P and progress notes reviewed:  Patient admitted on 7/20 with left upper lobe pneumonia.  CTA chest negative for PE Had a supratherapeutic INR above 12 that was reversed with vitamin K with INR dropping precipitously down to 1.8 x 7/22  Patient assessment Physical Exam Vitals and nursing note reviewed.  Constitutional:      General: He is in acute distress.     Comments: Anxious appearing  HENT:     Head: Normocephalic and atraumatic.  Cardiovascular:     Rate and Rhythm: Regular rhythm. Tachycardia present.     Heart sounds: Normal heart sounds.  Pulmonary:     Effort: Tachypnea and accessory muscle usage present.     Breath sounds: Wheezing present.  Abdominal:     Palpations: Abdomen is soft.     Tenderness: There is no abdominal tenderness.  Neurological:     Mental Status: Mental status is at baseline.       Assessment and  Interventions   Assessment: Acute worsening of respiratory failure Suspect COPD given wheezing Some, albeit small concern for PE given precipitous INR dropped from 12-1.8  Plan: Continue supplemental oxygen Repeat DuoNeb  Solu-Medrol x 1 to assess for symptomatic improvement CTA chest ordered Lovenox bridge 1 mg/kg x 1 pending CTA result Continue supplemental oxygen to keep sats over 92 Signout to incoming provider      CRITICAL CARE Performed by: Andris Baumann   Total critical care time: 30 minutes  Critical care time was exclusive of separately billable procedures and treating other patients.  Critical care was necessary to  treat or prevent imminent or life-threatening deterioration.  Critical care was time spent personally by me on the following activities: development of treatment plan with patient and/or surrogate as well as nursing, discussions with consultants, evaluation of patient's response to treatment, examination of patient, obtaining history from patient or surrogate, ordering and performing treatments and interventions, ordering and review of laboratory studies, ordering and review of radiographic studies, pulse oximetry and re-evaluation of patient's condition.

## 2023-06-20 NOTE — TOC Progression Note (Signed)
Transition of Care [facility]) - Progression Note    Patient Details  Name: Jorge Rosales MRN: [id number] Date of Birth: 12/24/1945  [facility] CM/SW Contact  Marlowe Sax, RN Phone Number: 06/20/2023, 1:41 PM  Clinical Narrative:    Spoke with the patient's daughter Mardella Layman in the room, I let her know that [facility] did make a bed offer, they accepted the bed, I explained his PASSR is pending, he will have to have that to [place], she stated understanding   Expected Discharge Plan: Home w Home Health Services Barriers to Discharge: Continued Medical Work up  Expected Discharge Plan and Services   Discharge Planning Services: CM Consult   Living arrangements for the past 2 months: Single Family Home                 DME Arranged: N/A DME Agency: NA       HH Arranged: PT, OT HH Agency: [facility] Date HH Agency Contacted: 06/19/23 Time HH Agency Contacted: 1411 Representative spoke with at [facility] Agency: Barbara Cower   Social Determinants of Health (SDOH) Interventions SDOH Screenings   Food Insecurity: No Food Insecurity (06/18/2023)  Housing: Patient Declined (06/18/2023)  Transportation Needs: No Transportation Needs (06/18/2023)  Utilities: Not At Risk (06/18/2023)  Financial Resource Strain: Low Risk  (10/26/2022)   Received from [facility], [facility]  Tobacco Use: Medium Risk (06/17/2023)    Readmission Risk Interventions     No data to display

## 2023-06-21 DIAGNOSIS — R791 Abnormal coagulation profile: Secondary | ICD-10-CM | POA: Diagnosis not present

## 2023-06-21 DIAGNOSIS — Z86711 Personal history of pulmonary embolism: Secondary | ICD-10-CM | POA: Diagnosis not present

## 2023-06-21 DIAGNOSIS — A419 Sepsis, unspecified organism: Secondary | ICD-10-CM | POA: Diagnosis not present

## 2023-06-21 DIAGNOSIS — J189 Pneumonia, unspecified organism: Secondary | ICD-10-CM | POA: Diagnosis not present

## 2023-06-21 DIAGNOSIS — J449 Chronic obstructive pulmonary disease, unspecified: Secondary | ICD-10-CM

## 2023-06-21 DIAGNOSIS — Z7901 Long term (current) use of anticoagulants: Secondary | ICD-10-CM

## 2023-06-21 DIAGNOSIS — Z87898 Personal history of other specified conditions: Secondary | ICD-10-CM

## 2023-06-21 DIAGNOSIS — E876 Hypokalemia: Secondary | ICD-10-CM | POA: Insufficient documentation

## 2023-06-21 DIAGNOSIS — R5381 Other malaise: Secondary | ICD-10-CM | POA: Insufficient documentation

## 2023-06-21 DIAGNOSIS — E871 Hypo-osmolality and hyponatremia: Secondary | ICD-10-CM

## 2023-06-21 DIAGNOSIS — I272 Pulmonary hypertension, unspecified: Secondary | ICD-10-CM

## 2023-06-21 LAB — CBC
HCT: 33.1 % — ABNORMAL LOW (ref 39.0–52.0)
Hemoglobin: 10.9 g/dL — ABNORMAL LOW (ref 13.0–17.0)
MCH: 31.5 pg (ref 26.0–34.0)
MCHC: 32.9 g/dL (ref 30.0–36.0)
MCV: 95.7 fL (ref 80.0–100.0)
Platelets: 285 10*3/uL (ref 150–400)
RBC: 3.46 MIL/uL — ABNORMAL LOW (ref 4.22–5.81)
RDW: 12.8 % (ref 11.5–15.5)
WBC: 15.4 10*3/uL — ABNORMAL HIGH (ref 4.0–10.5)
nRBC: 0 % (ref 0.0–0.2)

## 2023-06-21 LAB — CULTURE, BLOOD (ROUTINE X 2)
Culture: NO GROWTH
Special Requests: ADEQUATE

## 2023-06-21 LAB — BASIC METABOLIC PANEL
Anion gap: 5 (ref 5–15)
BUN: 9 mg/dL (ref 8–23)
CO2: 31 mmol/L (ref 22–32)
Calcium: 7.4 mg/dL — ABNORMAL LOW (ref 8.9–10.3)
Chloride: 100 mmol/L (ref 98–111)
Creatinine, Ser: 0.47 mg/dL — ABNORMAL LOW (ref 0.61–1.24)
GFR, Estimated: >60 mL/min (ref >60)
Glucose, Bld: 115 mg/dL — ABNORMAL HIGH (ref 70–99)
Potassium: 3.2 mmol/L — ABNORMAL LOW (ref 3.5–5.1)
Sodium: 136 mmol/L (ref 135–145)

## 2023-06-21 LAB — LEGIONELLA PNEUMOPHILA SEROGP 1 UR AG: L. pneumophila Serogp 1 Ur Ag: NEGATIVE

## 2023-06-21 LAB — PROTIME-INR
INR: 2.6 — ABNORMAL HIGH (ref 0.8–1.2)
Prothrombin Time: 28.2 seconds — ABNORMAL HIGH (ref 11.4–15.2)

## 2023-06-21 MED ORDER — DOCUSATE SODIUM 100 MG PO CAPS
100.0000 mg | ORAL_CAPSULE | Freq: Two times a day (BID) | ORAL | Status: DC
  Administered 2023-06-21 – 2023-06-23 (×4): 100 mg via ORAL
  Filled 2023-06-21 (×4): qty 1

## 2023-06-21 MED ORDER — ENSURE ENLIVE PO LIQD
237.0000 mL | Freq: Two times a day (BID) | ORAL | Status: DC
  Administered 2023-06-21 – 2023-06-22 (×3): 237 mL via ORAL

## 2023-06-21 MED ORDER — MINERAL OIL RE ENEM
1.0000 | ENEMA | Freq: Once | RECTAL | Status: AC
  Administered 2023-06-21: 1 via RECTAL

## 2023-06-21 MED ORDER — POTASSIUM CHLORIDE 20 MEQ PO PACK
40.0000 meq | PACK | Freq: Two times a day (BID) | ORAL | Status: AC
  Administered 2023-06-21 (×2): 40 meq via ORAL
  Filled 2023-06-21 (×2): qty 2

## 2023-06-21 MED ORDER — POLYETHYLENE GLYCOL 3350 17 G PO PACK
17.0000 g | PACK | Freq: Every day | ORAL | Status: DC
  Administered 2023-06-21 – 2023-06-23 (×3): 17 g via ORAL
  Filled 2023-06-21 (×3): qty 1

## 2023-06-21 MED ORDER — SENNA 8.6 MG PO TABS
1.0000 | ORAL_TABLET | Freq: Every day | ORAL | Status: DC
  Administered 2023-06-21: 8.6 mg via ORAL
  Filled 2023-06-21: qty 1

## 2023-06-21 NOTE — Progress Notes (Addendum)
Physical Therapy Treatment Patient Details Name: Jorge Rosales MRN: [id number] DOB: Aug 17, 1946 Today's Date: 06/21/2023   History of Present Illness 77 y/o male presented to ED on 06/17/23 for 4-day history of SOB. CTA showed new L upper lobe PNA. Admitted for sepsis 2/2 PNA. PMH: hx of PE, COPD on 2L O2, pulmonary HTN, hx of seizures    PT Comments  Per chart review, sats/ flow rate and HR all looking improved prior to entry today. At bedside, pt appears more relaxed, less anxious. DTR at bedside. Pt agreeable to session. Pt still has mild increases in dizziness at EOB (improve compared to previous day) and mild to moderate increased dyspnea, tachycardia, and anxiety with sustained standing (also much improved compared to yesterday). Symptoms ~50% improved, standing tolerance improved by 3 fold (from ~30sec to ~90 sec today while voiding). Pt also willng to attempt standing 3x this session, also a sign of improve activity tolerance. Flow rate remains lower than yesterday. Pt still requires elevated surface to come to standing due to acute weakness. Pt feels symptoms still limiting ability to tolerate any appreciable walking distances. Pt not ready to meet for mobility needs required for living alone. Will continue to follow.     Assistance Recommended at Discharge Intermittent Supervision/Assistance  If plan is discharge home, recommend the following:  Can travel by private vehicle    A [place] help with walking and/or transfers;Assistance with cooking/housework;Assist for transportation;Help with stairs or ramp for entrance;A [place] help with bathing/dressing/bathroom   No  Equipment Recommendations  Rollator (4 wheels)    Recommendations for Other Services       Precautions / Restrictions Precautions Precautions: Fall Restrictions Weight Bearing Restrictions: No     Mobility  Bed Mobility Overal bed mobility: Needs Assistance Bed Mobility: Supine to Sit     Supine to sit:  Supervision     General bed mobility comments: at EOB at EOS, plan for 10 more minutes    Transfers Overall transfer level: Needs assistance Equipment used: Rolling walker (2 wheels) Transfers: Sit to/from Stand Sit to Stand: Supervision, From elevated surface           General transfer comment: slight elevation per preference (tolerates longer time upright, but still limited by dyspnea when in stnading)    Ambulation/Gait Ambulation/Gait assistance:  (pt not confident to attempt at present due to dyspnea upon standing.)                 Stairs             Wheelchair Mobility     Tilt Bed    Modified Rankin (Stroke Patients Only)       Balance                                            Cognition Arousal/Alertness: Awake/alert Behavior During Therapy: WFL for tasks assessed/performed Overall Cognitive Status: Within Functional Limits for tasks assessed                                          Exercises Other Exercises Other Exercises: sustained sitting at EOB, flow rate increased from 2L/min to 3L/min for comfort. mild dizziness increased, but much bettere tolerated than previous day. Other Exercises: STS from slight elevation 3x45-60sec, increased HR  to 110s, sats fluctuating, but suspect WNL given signal error issues; CCM called with sustained sats in 80s percent (low 90s% when checked by author in session)    General Comments        Pertinent Vitals/Pain Pain Assessment Pain Assessment: No/denies pain    Home Living                          Prior Function            PT Goals (current goals can now be found in the care plan section) Acute Rehab PT Goals Patient Stated Goal: to get stronger PT Goal Formulation: With patient/family Time For Goal Achievement: 07/03/23 Potential to Achieve Goals: Good    Frequency    Min 1X/week      PT Plan      Co-evaluation               AM-PAC PT "6 Clicks" Mobility   Outcome Measure  Help needed turning from your back to your side while in a flat bed without using bedrails?: None Help needed moving from lying on your back to sitting on the side of a flat bed without using bedrails?: None Help needed moving to and from a bed to a chair (including a wheelchair)?: A [place] Help needed standing up from a chair using your arms (e.g., wheelchair or bedside chair)?: A [place] Help needed to walk in hospital room?: A Lot Help needed climbing 3-5 steps with a railing? : A Lot 6 Click Score: 18    End of Session Equipment Utilized During Treatment: Oxygen Activity Tolerance: No increased pain;Other (comment) (dyspnea on standing still remains a factor; still correaltes more with significant HR increase, rather than hypoxia.) Patient left: in bed;with family/visitor present;with call bell/phone within reach Nurse Communication: Mobility status PT Visit Diagnosis: Unsteadiness on feet (R26.81);Muscle weakness (generalized) (M62.81)     Time: 5284-1324 PT Time Calculation (min) (ACUTE ONLY): 33 min  Charges:    $Therapeutic Activity: 23-37 mins PT General Charges $$ ACUTE PT VISIT: 1 Visit                    1:22 PM, 06/21/23 Rosamaria Lints, PT, DPT Physical Therapist - [facility]  [phone] (ASCOM)    Armoni Depass C 06/21/2023, 1:18 PM

## 2023-06-21 NOTE — Plan of Care (Signed)
  Problem: Fluid Volume: Goal: Hemodynamic stability will improve Outcome: Progressing   Problem: Clinical Measurements: Goal: Diagnostic test results will improve Outcome: Progressing   Problem: Respiratory: Goal: Ability to maintain adequate ventilation will improve Outcome: Progressing   Problem: Health Behavior/Discharge Planning: Goal: Ability to manage health-related needs will improve Outcome: Progressing   Problem: Clinical Measurements: Goal: Ability to maintain clinical measurements within normal limits will improve Outcome: Progressing

## 2023-06-21 NOTE — Care Management Important Message (Signed)
Important Message  Patient Details  Name: Jorge Rosales MRN: [id number] Date of Birth: 1946/01/04   Medicare Important Message Given:  Yes     Olegario Messier A Lynzie Cliburn 06/21/2023, 10:54 AM

## 2023-06-21 NOTE — Progress Notes (Addendum)
Progress Note   Patient: Jorge Rosales ZOX:[id number] DOB: Jun 30, 1946 DOA: 06/17/2023     4 DOS: the patient was seen and examined on 06/21/2023   Brief hospital course: Jorge Rosales is a 77 y.o. male with medical history significant for COPD on home O2 at 2 L, former smoker, pulmonary hypertension, PE in 2022 on warfarin anticoagulation, who presents to the ED with a 4-day history of shortness of breath and cough.  Denies fever, chills or chest pain ED course and data review: Tachycardic to 118 and tachypneic to 34 with otherwise normal vitals.  Labs significant for WBC of 32,000, lactic acid pending.  Sodium 128 EKG, Personally viewed and interpreted showing sinus tachycardia at 117 with no ischemic ST-T wave changes CT angio of the chest showing pneumonia as follows: New left upper lobe pneumonia superimposed over more chronic changes seen on prior exams. Patient given fluid bolus started on Rocephin and azithromycin and hospitalist consulted for admission.   Assessment and Plan: * Left upper lobe pneumonia Sepsis Tachycardic and tachypneic improved. Leukocytosis, lactic acid trended down. Continue Rocephin and azithromycin DuoNebs as needed Supplemental oxygen as needed to maintain sat above 88% Continue antitussives.  Supratherapeutic INR Blood-tinged sputum INR was 12 upon presentation, today 2.6 Patient noted to have blood-tinged sputum in a cup on and off but better. Vitamin K 2.5 mg p.o. if persistent bleeding. Daily INR monitoring May need to switch him to DOAC.  History of pulmonary embolism Warfarin anticoagulation with supratherapeutic INR CTA negative for PE Holding Coumadin due to supratherapeutic upon presentation, mild bloody sputum. Pharmacy consult for Coumadin management.  Hypokalemia- Oral replacements ordered.  History of seizures Remote history of seizure.  Last seizure 1980s Continue Tegretol  COPD GOLD III Pulmonary hypertension Not acutely  exacerbated Continue home inhalers DuoNebs as needed.  Moderate malnutrition- Eating poor. Will get dietician evaluation. Ensure supplements ordered. Encouraged diet, supplements.  Debility- PT/ OT eval. Out of bed to chair. Mau need SNF placement.      Subjective: Patient is seen and examined today morning.  He is lying in bed, eating poor, has been having on and off blood-tinged sputum.  Patient is currently on 2 L supplemental oxygen.  Daughter is at bedside states he uses oxygen at night.  Patient is weak not getting out of bed. Physical Exam: Vitals:   06/20/23 1540 06/20/23 2322 06/21/23 0745 06/21/23 0802  BP: 115/74 118/76  133/76  Pulse: 98 67  85  Resp: 20 18  18   Temp: 98.9 F (37.2 C) 97.6 F (36.4 C)  98.6 F (37 C)  TempSrc:      SpO2: 95% 99% 96% 99%  Weight:      Height:       General - Elderly cachectic Caucasian male, no apparent distress HEENT - PERRLA, EOMI, atraumatic head, non tender sinuses. Lung - Clear, decreased to, rhonchi Heart - S1, S2 heard, no murmurs, rubs, no pedal edema Neuro - Alert, awake and oriented x 3, non focal exam. Skin - Warm and dry. Data Reviewed:     Latest Ref Rng & Units 06/21/2023    5:33 AM 06/20/2023    6:52 AM 06/19/2023    7:49 AM  CBC  WBC 4.0 - 10.5 K/uL 15.4  24.0  26.2   Hemoglobin 13.0 - 17.0 g/dL 81.1  91.4  78.2   Hematocrit 39.0 - 52.0 % 33.1  40.6  37.1   Platelets 150 - 400 K/uL 285  403  569  Latest Ref Rng & Units 06/21/2023    5:33 AM 06/20/2023    6:52 AM 06/19/2023    7:49 AM  BMP  Glucose 70 - 99 mg/dL 914  782  956   BUN 8 - 23 mg/dL 9  10  13    Creatinine 0.61 - 1.24 mg/dL 2.13  0.86  5.78   Sodium 135 - 145 mmol/L 136  138  134   Potassium 3.5 - 5.1 mmol/L 3.2  3.7  3.4   Chloride 98 - 111 mmol/L 100  100  99   CO2 22 - 32 mmol/L 31  31  27    Calcium 8.9 - 10.3 mg/dL 7.4  8.2  8.0    Results for orders placed or performed during the hospital encounter of 06/17/23  Blood culture  (routine x 2)     Status: None (Preliminary result)   Collection Time: 06/17/23 10:36 PM   Specimen: BLOOD LEFT ARM  Result Value Ref Range Status   Specimen Description BLOOD LEFT ARM  Final   Special Requests   Final    BOTTLES DRAWN AEROBIC AND ANAEROBIC Blood Culture adequate volume   Culture   Final    NO GROWTH 4 DAYS Performed at [facility], [address] [place], [place] [address]    Report Status PENDING  Incomplete  Blood culture (routine x 2)     Status: None (Preliminary result)   Collection Time: 06/17/23 10:36 PM   Specimen: BLOOD RIGHT ARM  Result Value Ref Range Status   Specimen Description BLOOD RIGHT ARM  Final   Special Requests   Final    BOTTLES DRAWN AEROBIC AND ANAEROBIC Blood Culture adequate volume   Culture   Final    NO GROWTH 4 DAYS Performed at [facility], [address] [place], [place] [address]    Report Status PENDING  Incomplete  SARS Coronavirus 2 by RT PCR (hospital order, performed in [facility] hospital lab) *cepheid single result test* Anterior Nasal Swab     Status: None   Collection Time: 06/17/23 11:19 PM   Specimen: Anterior Nasal Swab  Result Value Ref Range Status   SARS Coronavirus 2 by RT PCR NEGATIVE NEGATIVE Final    Comment: (NOTE) SARS-CoV-2 target nucleic acids are NOT DETECTED.  The SARS-CoV-2 RNA is generally detectable in upper and lower respiratory specimens during the acute phase of infection. The lowest concentration of SARS-CoV-2 viral copies this assay can detect is 250 copies / mL. A negative result does not preclude SARS-CoV-2 infection and should not be used as the sole basis for treatment or other patient management decisions.  A negative result may occur with improper specimen collection / handling, submission of specimen other than nasopharyngeal swab, presence of viral mutation(s) within the areas targeted by this assay, and inadequate number of viral copies (<250 copies / mL). A  negative result must be combined with clinical observations, patient history, and epidemiological information.  Fact Sheet for Patients:   RoadLapTop.co.za  Fact Sheet for Healthcare Providers: http://kim-miller.com/  This test is not yet approved or  cleared by the [place] FDA and has been authorized for detection and/or diagnosis of SARS-CoV-2 by FDA under an Emergency Use Authorization (EUA).  This EUA will remain in effect (meaning this test can be used) for the duration of the COVID-19 declaration under Section 564(b)(1) of the Act, 21 U.S.C. section 360bbb-3(b)(1), unless the authorization is terminated or revoked sooner.  Performed at [facility],  [address] [place], [place] [address]   Expectorated Sputum Assessment w Gram Stain, Rflx to Resp Cult     Status: None   Collection Time: 06/18/23 10:31 AM   Specimen: Sputum  Result Value Ref Range Status   Specimen Description SPUTUM  Final   Special Requests NONE  Final   Sputum evaluation   Final    THIS SPECIMEN IS ACCEPTABLE FOR SPUTUM CULTURE Performed at [facility], [address] [place], [place] [address]    Report Status 06/18/2023 FINAL  Final  Respiratory (~20 pathogens) panel by PCR     Status: None   Collection Time: 06/18/23 10:31 AM   Specimen: Expectorated Sputum; Respiratory  Result Value Ref Range Status   Adenovirus NOT DETECTED NOT DETECTED Final   Coronavirus 229E NOT DETECTED NOT DETECTED Final    Comment: (NOTE) The Coronavirus on the Respiratory Panel, DOES NOT test for the novel  Coronavirus (2019 nCoV)    Coronavirus HKU1 NOT DETECTED NOT DETECTED Final   Coronavirus NL63 NOT DETECTED NOT DETECTED Final   Coronavirus OC43 NOT DETECTED NOT DETECTED Final   Metapneumovirus NOT DETECTED NOT DETECTED Final   Rhinovirus / Enterovirus NOT DETECTED NOT DETECTED Final   Influenza A NOT DETECTED NOT DETECTED Final    Influenza B NOT DETECTED NOT DETECTED Final   Parainfluenza Virus 1 NOT DETECTED NOT DETECTED Final   Parainfluenza Virus 2 NOT DETECTED NOT DETECTED Final   Parainfluenza Virus 3 NOT DETECTED NOT DETECTED Final   Parainfluenza Virus 4 NOT DETECTED NOT DETECTED Final   Respiratory Syncytial Virus NOT DETECTED NOT DETECTED Final   Bordetella pertussis NOT DETECTED NOT DETECTED Final   Bordetella Parapertussis NOT DETECTED NOT DETECTED Final   Chlamydophila pneumoniae NOT DETECTED NOT DETECTED Final   Mycoplasma pneumoniae NOT DETECTED NOT DETECTED Final    Comment: Performed at [facility], [address] [place], [place] [address]  Culture, Respiratory w Gram Stain     Status: None (Preliminary result)   Collection Time: 06/18/23 10:31 AM   Specimen: SPU  Result Value Ref Range Status   Specimen Description   Final    SPUTUM Performed at [facility], [address] [place], [place] [address]    Special Requests   Final    NONE Reflexed from [id number] Performed at [facility], [address] [place], [place] [address]    Gram Stain   Final    FEW WBC PRESENT,BOTH PMN AND MONONUCLEAR RARE GRAM POSITIVE COCCI IN PAIRS    Culture   Final    CULTURE REINCUBATED FOR BETTER GROWTH Performed at [facility], [address], [place], [place] [address]    Report Status PENDING  Incomplete       Family Communication: Daughter at bedside updated regarding the plan  Disposition: Status is: Inpatient Remains inpatient appropriate because: IV antibiotics, PT/ dietician consult  Planned Discharge Destination: Home with Home Health and Skilled nursing facility    Time spent: 45 minutes  Author: Marcelino Duster, MD 06/21/2023 1:09 PM  For on call review [email or web address].

## 2023-06-21 NOTE — Progress Notes (Signed)
Occupational Therapy Treatment Patient Details Name: Jorge Rosales MRN: [id number] DOB: 15-Oct-1946 Today's Date: 06/21/2023   History of present illness 77 y/o male presented to ED on 06/17/23 for 4-day history of SOB. CTA showed new L upper lobe PNA. Admitted for sepsis 2/2 PNA. PMH: hx of PE, COPD on 2L O2, pulmonary HTN, hx of seizures   OT comments  Pt seen for OT treatment on this date. Upon arrival to room pt resting in bed, agreeable to tx. Pt requires supervision with bed mobility. CGA for standing without DME at sink side for >60min. Pt ambulated to bathroom and back with RW. Pt used urinal EOB with CGA. Supervision to donn/doff gown EOB. Pt making progress toward goals, will continue to follow POC. Discharge recommendation remains appropriate.   Rest - SpO2 96% - HR 86bpm Standing - SpO2 90% - HR 100bpm Walking 33ft - SpO2 84% - HR 114bpm Rest for 3-5 minutes - SpO2 92% - HR 105bpm   Recommendations for follow up therapy are one component of a multi-disciplinary discharge planning process, led by the attending physician.  Recommendations may be updated based on patient status, additional functional criteria and insurance authorization.    Assistance Recommended at Discharge Intermittent Supervision/Assistance  Patient can return home with the following  A Heavrin help with walking and/or transfers;A Orsborn help with bathing/dressing/bathroom;Assistance with cooking/housework;Assist for transportation   Equipment Recommendations  BSC/3in1    Recommendations for Other Services      Precautions / Restrictions Precautions Precautions: Fall Restrictions Weight Bearing Restrictions: No       Mobility Bed Mobility Overal bed mobility: Needs Assistance Bed Mobility: Supine to Sit     Supine to sit: Supervision     General bed mobility comments: at EOB at EOS, plan for 10 more minutes    Transfers Overall transfer level: Needs assistance Equipment used: Rolling walker (2  wheels) Transfers: Sit to/from Stand Sit to Stand: Supervision     Step pivot transfers: Min guard     General transfer comment: slight elevation per preference (tolerates longer time upright, but still limited by dyspnea when in standing)     Balance                                           ADL either performed or assessed with clinical judgement   ADL Overall ADL's : Needs assistance/impaired                 Upper Body Dressing : Set up;Sitting           Toileting- Clothing Manipulation and Hygiene: Sit to/from stand;Min guard       Functional mobility during ADLs: Min guard General ADL Comments: Anticipate supervision - set up assist for ADL completion due to decrease activity tolerance.    Extremity/Trunk Assessment Upper Extremity Assessment Upper Extremity Assessment: Generalized weakness   Lower Extremity Assessment Lower Extremity Assessment: Generalized weakness        Vision       Perception     Praxis      Cognition Arousal/Alertness: Awake/alert Behavior During Therapy: WFL for tasks assessed/performed Overall Cognitive Status: Within Functional Limits for tasks assessed                                 General Comments: Presented as  annoyed, but eased into session well.        Exercises      Shoulder Instructions       General Comments      Pertinent Vitals/ Pain       Pain Assessment Pain Assessment: No/denies pain  Home Living                                          Prior Functioning/Environment              Frequency  Min 1X/week        Progress Toward Goals  OT Goals(current goals can now be found in the care plan section)  Progress towards OT goals: Progressing toward goals  Acute Rehab OT Goals Patient Stated Goal: To feel strong enough to go home OT Goal Formulation: With patient Time For Goal Achievement: 07/03/23 Potential to Achieve Goals:  Good ADL Goals Pt Will Transfer to Toilet: with modified independence;ambulating Pt Will Perform Toileting - Clothing Manipulation and hygiene: with modified independence;sit to/from stand Pt Will Perform Tub/Shower Transfer: with modified independence;ambulating;rolling walker  Plan Discharge plan remains appropriate    Co-evaluation                 AM-PAC OT "6 Clicks" Daily Activity     Outcome Measure   Help from another person eating meals?: A Betton Help from another person taking care of personal grooming?: A Sermon Help from another person toileting, which includes using toliet, bedpan, or urinal?: A Hargrove Help from another person bathing (including washing, rinsing, drying)?: A Born Help from another person to put on and taking off regular upper body clothing?: A Simek Help from another person to put on and taking off regular lower body clothing?: A Maneri 6 Click Score: 18    End of Session Equipment Utilized During Treatment: Gait belt;Oxygen  OT Visit Diagnosis: Muscle weakness (generalized) (M62.81)   Activity Tolerance Patient limited by fatigue   Patient Left with call bell/phone within reach;in bed;with bed alarm set   Nurse Communication          Time: 1610-9604 OT Time Calculation (min): 44 min  Charges:    Thresa Ross, OTS  06/21/2023, 3:46 PM

## 2023-06-21 NOTE — Plan of Care (Signed)
  Problem: Respiratory: Goal: Ability to maintain adequate ventilation will improve Outcome: Progressing   Problem: Education: Goal: Knowledge of General Education information will improve Description: Including pain rating scale, medication(s)/side effects and non-pharmacologic comfort measures Outcome: Progressing   Problem: Health Behavior/Discharge Planning: Goal: Ability to manage health-related needs will improve Outcome: Progressing   Problem: Clinical Measurements: Goal: Will remain free from infection Outcome: Progressing Goal: Diagnostic test results will improve Outcome: Progressing Goal: Respiratory complications will improve Outcome: Progressing   Problem: Activity: Goal: Risk for activity intolerance will decrease Outcome: Progressing   Problem: Nutrition: Goal: Adequate nutrition will be maintained Outcome: Progressing   Problem: Coping: Goal: Level of anxiety will decrease Outcome: Progressing

## 2023-06-22 DIAGNOSIS — Z86711 Personal history of pulmonary embolism: Secondary | ICD-10-CM | POA: Diagnosis not present

## 2023-06-22 DIAGNOSIS — A419 Sepsis, unspecified organism: Secondary | ICD-10-CM | POA: Diagnosis not present

## 2023-06-22 DIAGNOSIS — R791 Abnormal coagulation profile: Secondary | ICD-10-CM | POA: Diagnosis not present

## 2023-06-22 DIAGNOSIS — J189 Pneumonia, unspecified organism: Secondary | ICD-10-CM | POA: Diagnosis not present

## 2023-06-22 DIAGNOSIS — E43 Unspecified severe protein-calorie malnutrition: Secondary | ICD-10-CM

## 2023-06-22 LAB — BASIC METABOLIC PANEL
BUN: 9 mg/dL (ref 8–23)
CO2: 28 mmol/L (ref 22–32)
Calcium: 7.5 mg/dL — ABNORMAL LOW (ref 8.9–10.3)
Chloride: 97 mmol/L — ABNORMAL LOW (ref 98–111)
Creatinine, Ser: 0.43 mg/dL — ABNORMAL LOW (ref 0.61–1.24)
GFR, Estimated: >60 mL/min (ref >60)
Glucose, Bld: 94 mg/dL (ref 70–99)
Potassium: 3.2 mmol/L — ABNORMAL LOW (ref 3.5–5.1)
Sodium: 135 mmol/L (ref 135–145)

## 2023-06-22 LAB — PROTIME-INR
INR: 2.6 — ABNORMAL HIGH (ref 0.8–1.2)
Prothrombin Time: 27.8 seconds — ABNORMAL HIGH (ref 11.4–15.2)

## 2023-06-22 LAB — CBC
HCT: 32.2 % — ABNORMAL LOW (ref 39.0–52.0)
HCT: 31.7 % — ABNORMAL LOW (ref 39.0–52.0)
Hemoglobin: 10.7 g/dL — ABNORMAL LOW (ref 13.0–17.0)
Hemoglobin: 10.7 g/dL — ABNORMAL LOW (ref 13.0–17.0)
MCH: 31.4 pg (ref 26.0–34.0)
MCHC: 33.2 g/dL (ref 30.0–36.0)
MCHC: 33.8 g/dL (ref 30.0–36.0)
MCV: 94.4 fL (ref 80.0–100.0)
MCV: 93 fL (ref 80.0–100.0)
Platelets: 299 10*3/uL (ref 150–400)
Platelets: 294 10*3/uL (ref 150–400)
RBC: 3.41 MIL/uL — ABNORMAL LOW (ref 4.22–5.81)
RBC: 3.41 MIL/uL — ABNORMAL LOW (ref 4.22–5.81)
RDW: 12.7 % (ref 11.5–15.5)
WBC: 16.6 10*3/uL — ABNORMAL HIGH (ref 4.0–10.5)
WBC: 14.5 10*3/uL — ABNORMAL HIGH (ref 4.0–10.5)
nRBC: 0 % (ref 0.0–0.2)
nRBC: 0 % (ref 0.0–0.2)

## 2023-06-22 LAB — CULTURE, RESPIRATORY W GRAM STAIN

## 2023-06-22 LAB — CULTURE, BLOOD (ROUTINE X 2)

## 2023-06-22 MED ORDER — ADULT MULTIVITAMIN W/MINERALS CH
1.0000 | ORAL_TABLET | Freq: Every day | ORAL | Status: DC
  Administered 2023-06-22 – 2023-06-23 (×2): 1 via ORAL
  Filled 2023-06-22 (×2): qty 1

## 2023-06-22 MED ORDER — ORAL CARE MOUTH RINSE: 15.0000 mL | OROMUCOSAL | Status: DC | PRN

## 2023-06-22 MED ORDER — MEGESTROL ACETATE 400 MG/10ML PO SUSP
400.0000 mg | Freq: Two times a day (BID) | ORAL | Status: DC
  Administered 2023-06-22 – 2023-06-23 (×3): 400 mg via ORAL
  Filled 2023-06-22 (×3): qty 10

## 2023-06-22 MED ORDER — ENSURE ENLIVE PO LIQD
237.0000 mL | Freq: Three times a day (TID) | ORAL | Status: DC
  Administered 2023-06-22 – 2023-06-23 (×3): 237 mL via ORAL

## 2023-06-22 MED ORDER — BISACODYL 10 MG RE SUPP: 10.0000 mg | Freq: Every day | RECTAL | Status: DC | PRN

## 2023-06-22 MED ORDER — PREDNISONE 20 MG PO TABS
20.0000 mg | ORAL_TABLET | Freq: Every day | ORAL | Status: AC
  Administered 2023-06-22 (×3): 20 mg via ORAL

## 2023-06-22 NOTE — Progress Notes (Addendum)
Initial Nutrition Assessment  DOCUMENTATION CODES:   Severe malnutrition in context of chronic illness  INTERVENTION:  - Increase to Ensure Enlive po TID, each supplement provides 350 kcal and 20 grams of protein.  - Add MVI q day.   - Consider adding an appetite stimulant (messaged MD).   - Liberalize to Regular diet.   NUTRITION DIAGNOSIS:   Severe Malnutrition related to chronic illness as evidenced by severe muscle depletion, severe fat depletion.  GOAL:   Patient will meet greater than or equal to 90% of their needs  MONITOR:   PO intake, Supplement acceptance  REASON FOR ASSESSMENT:   Consult Assessment of nutrition requirement/status  ASSESSMENT:   77 y.o. SOB. PMH includes: COPD, pulmonary HTN, pulmonary embolism, chronic bronchitis. Pt is currently receiving medical management related to L upper lobe pneumonia.  Meds reviewed:  colace, miralax, senokot. Labs reviewed: K low, creatinine low.   The pt reports that he was eating fairly well until about 1 week PTA. He states that he has not eaten much of anything in the past week. Per record, pt has experienced a 5.4% wt loss in the past 3 months, which is not significant in setting of malnutrition. However, pt with significant muscle/fat wasting. Pt states that he has been drinking Ensure and enjoys them. RD will increase to TID as well as a MVI. RD will also message MD about adding an appetite stimulant. Will continue to monitor PO intakes.   NUTRITION - FOCUSED PHYSICAL EXAM:  Flowsheet Row Most Recent Value  Orbital Region Severe depletion  Upper Arm Region Severe depletion  Thoracic and Lumbar Region Severe depletion  Buccal Region Severe depletion  Temple Region Severe depletion  Clavicle Bone Region Severe depletion  Clavicle and Acromion Bone Region Severe depletion  Scapular Bone Region Severe depletion  Dorsal Hand Severe depletion  Patellar Region Severe depletion  Anterior Thigh Region Severe  depletion  Posterior Calf Region Severe depletion  Edema (RD Assessment) None  Hair Reviewed  Eyes Reviewed  Mouth Reviewed  Skin Reviewed  Nails Reviewed       Diet Order:   Diet Order             Diet Heart Room service appropriate? Yes; Fluid consistency: Thin  Diet effective now                   EDUCATION NEEDS:   Not appropriate for education at this time  Skin:  Skin Assessment: Reviewed RN Assessment  Last BM:  7/25 - type 6  Height:   Ht Readings from Last 1 Encounters:  06/17/23 5\' 10"  (1.778 m)    Weight:   Wt Readings from Last 1 Encounters:  06/18/23 57.9 kg    Ideal Body Weight:     BMI:  Body mass index is 18.32 kg/m.  Estimated Nutritional Needs:   Kcal:  1800-2100 kcals  Protein:  90-105 gm  Fluid:  >/= 1.8 L  Bethann Humble, RD, LDN, CNSC.

## 2023-06-22 NOTE — Progress Notes (Signed)
Occupational Therapy Treatment Patient Details Name: Jorge Rosales MRN: [id number] DOB: 1946-01-19 Today's Date: 06/22/2023   History of present illness 77 y/o male presented to ED on 06/17/23 for 4-day history of SOB. CTA showed new L upper lobe PNA. Admitted for sepsis 2/2 PNA. PMH: hx of PE, COPD on 2L O2, pulmonary HTN, hx of seizures   OT comments  Mr Kunzler was seen for OT treatment on this date. Upon arrival to room pt reclined in bed, agreeable to tx. Pt requires SBA + RW sit<>stand, CGA + HHA sit<>stand. Tolerates 2 min standing for 2 trials, desat mid 80s, recovers quickly to low 90s in sitting, 5 min to recover to high 90s. Extensive repeated education on oxygen readings with poor recall from prior session's education. Educated on HEP - green theraband and IS provided. Pt making progress toward goals, continues to be limited by tolerance, will continue to follow POC. Discharge recommendation remains appropriate.     Recommendations for follow up therapy are one component of a multi-disciplinary discharge planning process, led by the attending physician.  Recommendations may be updated based on patient status, additional functional criteria and insurance authorization.    Assistance Recommended at Discharge Intermittent Supervision/Assistance  Patient can return home with the following  A Hankinson help with walking and/or transfers;A Theys help with bathing/dressing/bathroom;Assistance with cooking/housework;Assist for transportation   Equipment Recommendations  BSC/3in1    Recommendations for Other Services      Precautions / Restrictions Precautions Precautions: Fall Restrictions Weight Bearing Restrictions: No       Mobility Bed Mobility Overal bed mobility: Needs Assistance Bed Mobility: Supine to Sit, Sit to Supine     Supine to sit: Supervision Sit to supine: Supervision        Transfers Overall transfer level: Needs assistance Equipment used: Rolling walker (2  wheels), 1 person hand held assist Transfers: Sit to/from Stand Sit to Stand: Min guard                 Balance Overall balance assessment: No apparent balance deficits (not formally assessed)                                         ADL either performed or assessed with clinical judgement   ADL Overall ADL's : Needs assistance/impaired                       Lower Body Dressing Details (indicate cue type and reason): Supervision to adjust socks seated EOB             Functional mobility during ADLs: Min guard;Rolling walker (2 wheels)        Cognition Arousal/Alertness: Awake/alert Behavior During Therapy: WFL for tasks assessed/performed Overall Cognitive Status: Within Functional Limits for tasks assessed                                 General Comments: Demonstration required for RW use, repeated education on oxygen readings with poor recall from prior session's education                   Pertinent Vitals/ Pain       Pain Assessment Pain Assessment: No/denies pain   Frequency  Min 1X/week        Progress Toward Goals  OT Goals(current goals  can now be found in the care plan section)  Progress towards OT goals: Progressing toward goals  Acute Rehab OT Goals Patient Stated Goal: to have energy OT Goal Formulation: With patient Time For Goal Achievement: 07/03/23 Potential to Achieve Goals: Good ADL Goals Pt Will Transfer to Toilet: with modified independence;ambulating Pt Will Perform Toileting - Clothing Manipulation and hygiene: with modified independence;sit to/from stand Pt Will Perform Tub/Shower Transfer: with modified independence;ambulating;rolling walker  Plan Discharge plan remains appropriate    Co-evaluation                 AM-PAC OT "6 Clicks" Daily Activity     Outcome Measure   Help from another person eating meals?: A Salahuddin Help from another person taking care of personal  grooming?: A Bearce Help from another person toileting, which includes using toliet, bedpan, or urinal?: A Kaas Help from another person bathing (including washing, rinsing, drying)?: A Bahl Help from another person to put on and taking off regular upper body clothing?: A Rozell Help from another person to put on and taking off regular lower body clothing?: A Vandiver 6 Click Score: 18    End of Session Equipment Utilized During Treatment: Oxygen;Rolling walker (2 wheels)  OT Visit Diagnosis: Muscle weakness (generalized) (M62.81)   Activity Tolerance Patient limited by fatigue;Patient tolerated treatment well   Patient Left with call bell/phone within reach;in bed;with bed alarm set;with family/visitor present   Nurse Communication          Time: 1610-9604 OT Time Calculation (min): 33 min  Charges: OT General Charges $OT Visit: 1 Visit OT Treatments $Therapeutic Activity: 23-37 mins  Kathie Dike, M.S. OTR/L  06/22/23, 12:28 PM  ascom [phone]

## 2023-06-22 NOTE — Plan of Care (Signed)

## 2023-06-22 NOTE — Progress Notes (Signed)
Progress Note   Patient: Jorge Rosales XBJ:[id number] DOB: [birth year] DOA: 06/17/2023     5 DOS: the patient was seen and examined on 06/22/2023   Brief hospital course: Jorge Rosales is a 77 y.o. male with medical history significant for COPD on home O2 at 2 L, former smoker, pulmonary hypertension, PE in 2022 on warfarin anticoagulation, who presents to the ED with a 4-day history of shortness of breath and cough.  Denies fever, chills or chest pain ED course and data review: Tachycardic to 118 and tachypneic to 34 with otherwise normal vitals.  Labs significant for WBC of 32,000, lactic acid pending.  Sodium 128 EKG, Personally viewed and interpreted showing sinus tachycardia at 117 with no ischemic ST-T wave changes CT angio of the chest showing pneumonia as follows: New left upper lobe pneumonia superimposed over more chronic changes seen on prior exams. Patient given fluid bolus started on Rocephin and azithromycin and hospitalist consulted for admission.   Assessment and Plan: * Left upper lobe pneumonia Sepsis Tachycardic and tachypneic improved. Leukocytosis, lactic acid trended down. Continue Cefepime day #6 DuoNebs as needed Supplemental oxygen as needed to maintain sat above 88% Continue antitussives.  Supratherapeutic INR Blood-tinged sputum INR was 12 upon presentation, today 2.6 Patient noted to have blood-tinged sputum in a cup on and off but better. Vitamin K 2.5 mg p.o. if persistent bleeding. Daily INR monitoring May need to switch him to DOAC.  History of pulmonary embolism Warfarin anticoagulation with supratherapeutic INR CTA negative for PE Holding Coumadin due to supratherapeutic upon presentation, mild bloody sputum. Pharmacy consult for Coumadin management.  Hypokalemia- Oral replacements ordered.  History of seizures Remote history of seizure.  Last seizure 1980s Continue Tegretol  COPD GOLD III Pulmonary hypertension Not acutely  exacerbated Continue home inhalers. Leukocytosis due to IV steroids. Taper steroids to 20mg  oral. DuoNebs as needed.  Severe protein calorie malnutrition- Eating poor. Dietician evaluation appreciated. Megace, ensure supplements ordered. Encouraged diet, supplements.  Debility- PT/ OT eval advised SNF placement. Out of bed to chair. Encourage incentive spirometry.      Subjective: Patient is seen and examined today morning.  He is lying in bed, eating poor, feels not ready to go SNF. Daughter at bedside. Energy poor. Feels short of breath with mild exertion.  Physical Exam: Vitals:   06/21/23 1534 06/21/23 2049 06/21/23 2305 06/22/23 0826  BP: 120/77  136/75 124/69  Pulse: 84  80 85  Resp: 18  (!) 22 18  Temp: 98.3 F (36.8 C)  98.5 F (36.9 C) 97.8 F (36.6 C)  TempSrc:    Oral  SpO2: 100% 99% 94% 98%  Weight:      Height:       General - Elderly cachectic Caucasian male, mild respiratory distress HEENT - PERRLA, EOMI, atraumatic head, non tender sinuses. Lung - Clear, diffuse rales, rhonchi Heart - S1, S2 heard, no murmurs, rubs, no pedal edema Neuro - Alert, awake and oriented x 3, non focal exam. Skin - Warm and dry. Data Reviewed:     Latest Ref Rng & Units 06/22/2023    6:13 AM 06/21/2023    5:33 AM 06/20/2023    6:52 AM  CBC  WBC 4.0 - 10.5 K/uL 16.6  15.4  24.0   Hemoglobin 13.0 - 17.0 g/dL 30.8  65.7  84.6   Hematocrit 39.0 - 52.0 % 32.2  33.1  40.6   Platelets 150 - 400 K/uL 299  285  403  Latest Ref Rng & Units 06/22/2023    6:13 AM 06/21/2023    5:33 AM 06/20/2023    6:52 AM  BMP  Glucose 70 - 99 mg/dL 94  161  096   BUN 8 - 23 mg/dL 9  9  10    Creatinine 0.61 - 1.24 mg/dL 0.45  4.09  8.11   Sodium 135 - 145 mmol/L 135  136  138   Potassium 3.5 - 5.1 mmol/L 3.2  3.2  3.7   Chloride 98 - 111 mmol/L 97  100  100   CO2 22 - 32 mmol/L 28  31  31    Calcium 8.9 - 10.3 mg/dL 7.5  7.4  8.2    Results for orders placed or performed during the  hospital encounter of 06/17/23  Blood culture (routine x 2)     Status: None   Collection Time: 06/17/23 10:36 PM   Specimen: BLOOD LEFT ARM  Result Value Ref Range Status   Specimen Description BLOOD LEFT ARM  Final   Special Requests   Final    BOTTLES DRAWN AEROBIC AND ANAEROBIC Blood Culture adequate volume   Culture   Final    NO GROWTH 5 DAYS Performed at [facility], [address] [place], [place] [address]    Report Status 06/22/2023 FINAL  Final  Blood culture (routine x 2)     Status: None   Collection Time: 06/17/23 10:36 PM   Specimen: BLOOD RIGHT ARM  Result Value Ref Range Status   Specimen Description BLOOD RIGHT ARM  Final   Special Requests   Final    BOTTLES DRAWN AEROBIC AND ANAEROBIC Blood Culture adequate volume   Culture   Final    NO GROWTH 5 DAYS Performed at [facility], [address] [place], [place] [address]    Report Status 06/22/2023 FINAL  Final  SARS Coronavirus 2 by RT PCR (hospital order, performed in [facility] hospital lab) *cepheid single result test* Anterior Nasal Swab     Status: None   Collection Time: 06/17/23 11:19 PM   Specimen: Anterior Nasal Swab  Result Value Ref Range Status   SARS Coronavirus 2 by RT PCR NEGATIVE NEGATIVE Final    Comment: (NOTE) SARS-CoV-2 target nucleic acids are NOT DETECTED.  The SARS-CoV-2 RNA is generally detectable in upper and lower respiratory specimens during the acute phase of infection. The lowest concentration of SARS-CoV-2 viral copies this assay can detect is 250 copies / mL. A negative result does not preclude SARS-CoV-2 infection and should not be used as the sole basis for treatment or other patient management decisions.  A negative result may occur with improper specimen collection / handling, submission of specimen other than nasopharyngeal swab, presence of viral mutation(s) within the areas targeted by this assay, and inadequate number of viral copies (<250  copies / mL). A negative result must be combined with clinical observations, patient history, and epidemiological information.  Fact Sheet for Patients:   RoadLapTop.co.za  Fact Sheet for Healthcare Providers: http://kim-miller.com/  This test is not yet approved or  cleared by the [place] FDA and has been authorized for detection and/or diagnosis of SARS-CoV-2 by FDA under an Emergency Use Authorization (EUA).  This EUA will remain in effect (meaning this test can be used) for the duration of the COVID-19 declaration under Section 564(b)(1) of the Act, 21 U.S.C. section 360bbb-3(b)(1), unless the authorization is terminated or revoked sooner.  Performed at [facility], [address] [place], [place] [address]   Expectorated Sputum Assessment w Gram Stain, Rflx to Resp Cult     Status: None   Collection Time: 06/18/23 10:31 AM   Specimen: Sputum  Result Value Ref Range Status   Specimen Description SPUTUM  Final   Special Requests NONE  Final   Sputum evaluation   Final    THIS SPECIMEN IS ACCEPTABLE FOR SPUTUM CULTURE Performed at [facility], [address], [place], [place] [address]    Report Status 06/18/2023 FINAL  Final  Respiratory (~20 pathogens) panel by PCR     Status: None   Collection Time: 06/18/23 10:31 AM   Specimen: Expectorated Sputum; Respiratory  Result Value Ref Range Status   Adenovirus NOT DETECTED NOT DETECTED Final   Coronavirus 229E NOT DETECTED NOT DETECTED Final    Comment: (NOTE) The Coronavirus on the Respiratory Panel, DOES NOT test for the novel  Coronavirus (2019 nCoV)    Coronavirus HKU1 NOT DETECTED NOT DETECTED Final   Coronavirus NL63 NOT DETECTED NOT DETECTED Final   Coronavirus OC43 NOT DETECTED NOT DETECTED Final   Metapneumovirus NOT DETECTED NOT DETECTED Final   Rhinovirus / Enterovirus NOT DETECTED NOT DETECTED Final   Influenza A NOT DETECTED NOT DETECTED  Final   Influenza B NOT DETECTED NOT DETECTED Final   Parainfluenza Virus 1 NOT DETECTED NOT DETECTED Final   Parainfluenza Virus 2 NOT DETECTED NOT DETECTED Final   Parainfluenza Virus 3 NOT DETECTED NOT DETECTED Final   Parainfluenza Virus 4 NOT DETECTED NOT DETECTED Final   Respiratory Syncytial Virus NOT DETECTED NOT DETECTED Final   Bordetella pertussis NOT DETECTED NOT DETECTED Final   Bordetella Parapertussis NOT DETECTED NOT DETECTED Final   Chlamydophila pneumoniae NOT DETECTED NOT DETECTED Final   Mycoplasma pneumoniae NOT DETECTED NOT DETECTED Final    Comment: Performed at [facility], [address] [place], [place] [address]  Culture, Respiratory w Gram Stain     Status: None   Collection Time: 06/18/23 10:31 AM   Specimen: SPU  Result Value Ref Range Status   Specimen Description   Final    SPUTUM Performed at [facility], [address] [place], [place] [address]    Special Requests   Final    NONE Reflexed from [id number] Performed at [facility], [address] [place], [place] [address]    Gram Stain   Final    FEW WBC PRESENT,BOTH PMN AND MONONUCLEAR RARE GRAM POSITIVE COCCI IN PAIRS Performed at [facility], [address], [place], [place] [address]    Culture RARE STENOTROPHOMONAS MALTOPHILIA  Final   Report Status 06/22/2023 FINAL  Final   Organism ID, Bacteria STENOTROPHOMONAS MALTOPHILIA  Final      Susceptibility   Stenotrophomonas maltophilia - MIC*    LEVOFLOXACIN 0.25 SENSITIVE Sensitive     TRIMETH/SULFA <=20 SENSITIVE Sensitive     * RARE STENOTROPHOMONAS MALTOPHILIA       Family Communication: Daughter at bedside updated regarding the plan  Disposition: Status is: Inpatient Remains inpatient appropriate because: IV antibiotics, dyspnea, poor oral intake, SNF  Planned Discharge Destination: Home with Home Health and Skilled nursing facility    Time spent: 46 minutes  Author: Marcelino Duster,  MD 06/22/2023 4:27 PM  For on call review [email or web address].

## 2023-06-23 DIAGNOSIS — Z86711 Personal history of pulmonary embolism: Secondary | ICD-10-CM | POA: Diagnosis not present

## 2023-06-23 DIAGNOSIS — R791 Abnormal coagulation profile: Secondary | ICD-10-CM | POA: Diagnosis not present

## 2023-06-23 DIAGNOSIS — A419 Sepsis, unspecified organism: Secondary | ICD-10-CM | POA: Diagnosis not present

## 2023-06-23 DIAGNOSIS — J189 Pneumonia, unspecified organism: Secondary | ICD-10-CM | POA: Diagnosis not present

## 2023-06-23 DIAGNOSIS — E876 Hypokalemia: Secondary | ICD-10-CM

## 2023-06-23 MED ORDER — AMOXICILLIN-POT CLAVULANATE 500-125 MG PO TABS: 1.0000 | ORAL_TABLET | Freq: Three times a day (TID) | ORAL | 0 refills | Status: AC

## 2023-06-23 MED ORDER — ENSURE ENLIVE PO LIQD: 237.0000 mL | Freq: Three times a day (TID) | ORAL | 12 refills | Status: DC

## 2023-06-23 MED ORDER — MEGESTROL ACETATE 400 MG/10ML PO SUSP: 400.0000 mg | Freq: Two times a day (BID) | ORAL | 0 refills | Status: DC

## 2023-06-23 MED ORDER — POTASSIUM CHLORIDE 20 MEQ PO PACK
40.0000 meq | PACK | ORAL | Status: AC
  Administered 2023-06-23 (×2): 40 meq via ORAL
  Filled 2023-06-23 (×2): qty 2

## 2023-06-23 MED ORDER — IPRATROPIUM-ALBUTEROL 0.5-2.5 (3) MG/3ML IN SOLN: 3.0000 mL | Freq: Four times a day (QID) | RESPIRATORY_TRACT | 3 refills | Status: DC | PRN

## 2023-06-23 MED ORDER — WARFARIN SODIUM 5 MG PO TABS: 5.0000 mg | ORAL_TABLET | Freq: Every day | ORAL | 0 refills | Status: DC

## 2023-06-23 MED ORDER — ADULT MULTIVITAMIN W/MINERALS CH: 1.0000 | ORAL_TABLET | Freq: Every day | ORAL | 3 refills | Status: DC

## 2023-06-23 NOTE — TOC Progression Note (Signed)
Transition of Care [facility]) - Progression Note    Patient Details  Name: Jorge Rosales MRN: [id number] Date of Birth: Nov 19, 1946  [facility] CM/SW Contact  Marlowe Sax, RN Phone Number: 06/23/2023, 12:15 PM  Clinical Narrative:    Spoke with daughter Mardella Layman, Notified her of the DC, Room E11, EMS called to go to [facility]    Expected Discharge Plan: Home w Home Health Services Barriers to Discharge: Continued Medical Work up  Expected Discharge Plan and Services   Discharge Planning Services: CM Consult   Living arrangements for the past 2 months: Single Family Home Expected Discharge Date: 06/23/23               DME Arranged: N/A DME Agency: NA       HH Arranged: PT, OT HH Agency: [facility] Date HH Agency Contacted: 06/19/23 Time HH Agency Contacted: 1411 Representative spoke with at [facility] Agency: Barbara Cower   Social Determinants of Health (SDOH) Interventions SDOH Screenings   Food Insecurity: No Food Insecurity (06/18/2023)  Housing: Patient Declined (06/18/2023)  Transportation Needs: No Transportation Needs (06/18/2023)  Utilities: Not At Risk (06/18/2023)  Financial Resource Strain: Low Risk  (10/26/2022)   Received from [facility], [facility]  Tobacco Use: Medium Risk (06/17/2023)    Readmission Risk Interventions     No data to display

## 2023-06-23 NOTE — Progress Notes (Signed)
Physical Therapy Treatment Patient Details Name: Jorge Rosales MRN: [id number] DOB: 1946/04/22 Today's Date: 06/23/2023   History of Present Illness 77 y/o male presented to ED on 06/17/23 for 4-day history of SOB. CTA showed new L upper lobe PNA. Admitted for sepsis 2/2 PNA. PMH: hx of PE, COPD on 2L O2, pulmonary HTN, hx of seizures    PT Comments  Pt seen for PT tx with pt agreeable, daughter present. Pt on 3L/min via nasal cannula throughout session. PT educated pt on importance of OOB mobility & sitting in recliner. Pt is able to progress to walking in room with RW & CGA<>min assist. Pt does require seated rest breaks 2/2 SOB. Will continue to follow pt acutely to progress mobility as able.     Assistance Recommended at Discharge Intermittent Supervision/Assistance  If plan is discharge home, recommend the following:  Can travel by private vehicle    A [place] help with walking and/or transfers;Assistance with cooking/housework;Assist for transportation;Help with stairs or ramp for entrance;A [place] help with bathing/dressing/bathroom   No  Equipment Recommendations  Rollator (4 wheels)    Recommendations for Other Services       Precautions / Restrictions Precautions Precautions: Fall Restrictions Weight Bearing Restrictions: No     Mobility  Bed Mobility Overal bed mobility: Needs Assistance Bed Mobility: Supine to Sit     Supine to sit: Supervision, HOB elevated     General bed mobility comments: use of bed rails PRN    Transfers Overall transfer level: Needs assistance Equipment used: Rolling walker (2 wheels) Transfers: Sit to/from Stand Sit to Stand: Min guard, Supervision   Step pivot transfers: Supervision, Min guard       General transfer comment: cuing re: hand placement especially during stand>sit    Ambulation/Gait Ambulation/Gait assistance: Min guard, Supervision Gait Distance (Feet): 8 Feet ((4 ft forwards + 4 ft backwards x2)) Assistive  device: Rolling walker (2 wheels) Gait Pattern/deviations: Decreased step length - left, Decreased stride length, Decreased step length - right Gait velocity: decreased     General Gait Details: 1 LOB when stepping backwards with min assist to correct   Stairs             Wheelchair Mobility     Tilt Bed    Modified Rankin (Stroke Patients Only)       Balance Overall balance assessment: Mild deficits observed, not formally tested                                          Cognition Arousal/Alertness: Awake/alert Behavior During Therapy: WFL for tasks assessed/performed Overall Cognitive Status: Within Functional Limits for tasks assessed                                          Exercises      General Comments        Pertinent Vitals/Pain Pain Assessment Pain Assessment: Faces Faces Pain Scale: No hurt    Home Living                          Prior Function            PT Goals (current goals can now be found in the care plan section) Acute Rehab PT Goals  Patient Stated Goal: to get stronger PT Goal Formulation: With patient/family Time For Goal Achievement: 07/03/23 Potential to Achieve Goals: Good Progress towards PT goals: Progressing toward goals    Frequency    Min 1X/week      PT Plan Current plan remains appropriate    Co-evaluation              AM-PAC PT "6 Clicks" Mobility   Outcome Measure  Help needed turning from your back to your side while in a flat bed without using bedrails?: None Help needed moving from lying on your back to sitting on the side of a flat bed without using bedrails?: None Help needed moving to and from a bed to a chair (including a wheelchair)?: A Cillo Help needed standing up from a chair using your arms (e.g., wheelchair or bedside chair)?: A Henneman Help needed to walk in hospital room?: A Wronski Help needed climbing 3-5 steps with a railing? : A  [place] 6 Click Score: 20    End of Session Equipment Utilized During Treatment: Oxygen Activity Tolerance: Patient tolerated treatment well Patient left: in chair;with call bell/phone within reach;with family/visitor present Nurse Communication: Mobility status PT Visit Diagnosis: Unsteadiness on feet (R26.81);Muscle weakness (generalized) (M62.81)     Time: 7829-5621 PT Time Calculation (min) (ACUTE ONLY): 24 min  Charges:    $Therapeutic Activity: 23-37 mins PT General Charges $$ ACUTE PT VISIT: 1 Visit                     Aleda Grana, PT, DPT 06/23/23, 12:50 PM   Sandi Mariscal 06/23/2023, 12:48 PM

## 2023-06-23 NOTE — Discharge Summary (Addendum)
Physician Discharge Summary   Patient: Jorge Rosales MRN: [id number] DOB: Jun 29, 1946  Admit date:     06/17/2023  Discharge date: 06/23/23  Discharge Physician: Marcelino Duster   PCP: [facility]   Recommendations at discharge:    PCP follow up. INR, electrolyte check.  Discharge Diagnoses: Principal Problem:   Left upper lobe pneumonia Active Problems:   Sepsis (HCC)   Supratherapeutic INR   History of pulmonary embolism   COPD GOLD III   Pulmonary hypertension, unspecified (HCC)   Warfarin anticoagulation   CAP (community acquired pneumonia)   Hyponatremia   History of seizures   Debility   Hypokalemia   Protein-calorie malnutrition, severe  Resolved Problems:   * No resolved hospital problems. *  Hospital Course: Jorge Rosales is a 77 y.o. male with medical history significant for COPD on home O2 at 2 L, former smoker, pulmonary hypertension, PE in 2022 on warfarin anticoagulation, who presents to the ED with a 4-day history of shortness of breath and cough.  Denies fever, chills or chest pain ED course and data review: Tachycardic to 118 and tachypneic to 34 with otherwise normal vitals.  Labs significant for WBC of 32,000, lactic acid pending.  Sodium 128 EKG, Personally viewed and interpreted showing sinus tachycardia at 117 with no ischemic ST-T wave changes CT angio of the chest showing pneumonia as follows: New left upper lobe pneumonia superimposed over more chronic changes seen on prior exams. Patient given fluid bolus started on Rocephin and azithromycin and is admitted to the hospitalist service for further management evaluation. During the hospital stay, patient remained short of breath, had blood-tinged sputum.  Patient had elevated INR for which he did receive vitamin K with reversal.  INR did improve to therapeutic range.  Patient's antibiotic changed to cefepime therapy which she finished 6 days.  He remains weak, debilitated.  PT evaluated  advised skilled nursing facility placement.  Patient is eating poor, evaluated by dietitian, started on Megace and Ensure supplements.  Patient is encouraged to eat and drink.  Daughter advised to talk to PCP regarding reason to continue long-term anticoagulation therapy.  He will need neurology evaluation with her to continue Tegretol therapy.  Discussed regarding newer anticoagulants which he can get if he is off carbamazepine.  Patient's electrolytes closely monitored and replaced accordingly.  He is hemodynamically stable to be discharged to skilled nursing facility.  I advised PCP and pulmonary follow-up upon discharge as instructed.  Patient and daughter understand and agree with the discharge plan.  Assessment and Plan: Left upper lobe pneumonia Sepsis Tachycardic and tachypneic improved. Leukocytosis, lactic acid trended down. Finished Cefepime day #6. Augmentin for 3 days prescribed. DuoNebs as needed Supplemental oxygen as needed to maintain sat above 88% Pulmonary follow up as scheduled.   History of pulmonary embolism Supratherapeutic INR Blood-tinged sputum INR was 12 upon presentation, got vitamin K. Today 1.7, No more bloody sputum. Will resume Coumadin 5 mg. Recheck INR next week Advised her to talk to PCP regarding continuation of Coumadin versus changing to DOAC depending on neurology evaluation for his seizure disorder as he is on carbamazepine   Hypokalemia- Oral replacements ordered.   History of seizures Remote history of seizure.  Last seizure 1980s He is on Tegretol, advised outpatient neurology follow-up regarding continuing, limiting therapy.   COPD GOLD III Pulmonary hypertension Continue home inhalers. Leukocytosis due to IV steroids. Tapered off steroids. DuoNebs as needed. Outpatient pulmonary follow-up suggested   Severe protein calorie malnutrition- Eating  poor. Dietician evaluated. Megace, ensure supplements prescribed Encouraged diet,  supplements.   Debility- PT/ OT eval advised SNF placement. Out of bed to chair. Encourage incentive spirometry.       Consultants: Pulmonology  Procedures performed: none   Disposition: Skilled nursing facility Diet recommendation:  Discharge Diet Orders (From admission, onward)     Start     Ordered   06/23/23 0000  Diet - low sodium heart healthy        06/23/23 1056           Cardiac diet DISCHARGE MEDICATION: Allergies as of 06/23/2023       Reactions   Wound Dressing Adhesive Rash   Rash with blisters; band-aids espescially. Per [facility] Clinical Summary   Latex Rash   Per [facility] Clinical Summary        Medication List     STOP taking these medications    budesonide-formoterol 160-4.5 MCG/ACT inhaler Commonly known as: Symbicort       TAKE these medications    albuterol 108 (90 Base) MCG/ACT inhaler Commonly known as: VENTOLIN HFA Inhale 2 puffs into the lungs every 4 (four) hours as needed. Notes to patient: Not given in the hospital   amoxicillin-clavulanate 500-125 MG tablet Commonly known as: Augmentin Take 1 tablet by mouth 3 (three) times daily for 3 days.   carbamazepine 200 MG tablet Commonly known as: TEGRETOL Take 1 tablet by mouth 3 (three) times daily.   cyanocobalamin 1000 MCG tablet Commonly known as: VITAMIN B12 Take 1,000 mcg by mouth daily. PRN Notes to patient: Not given in the hospital   DULoxetine 30 MG capsule Commonly known as: CYMBALTA Take 30 mg by mouth daily. What changed: Another medication with the same name was removed. Continue taking this medication, and follow the directions you see here.   feeding supplement Liqd Take 237 mLs by mouth 3 (three) times daily between meals.   ipratropium-albuterol 0.5-2.5 (3) MG/3ML Soln Commonly known as: DUONEB Take 3 mLs by nebulization every 6 (six) hours as needed.   megestrol 400 MG/10ML suspension Commonly known as: MEGACE Take 10 mLs (400 mg total) by mouth 2  (two) times daily.   multivitamin with minerals Tabs tablet Take 1 tablet by mouth daily. Start taking on: June 24, 2023   Trelegy Ellipta 100-62.5-25 MCG/ACT Aepb Generic drug: Fluticasone-Umeclidin-Vilant Inhale 1 puff into the lungs daily. Notes to patient: Not given in the hospital   warfarin 5 MG tablet Commonly known as: COUMADIN Take 1 tablet (5 mg total) by mouth daily at 4 PM. What changed:  how much to take when to take this Another medication with the same name was removed. Continue taking this medication, and follow the directions you see here. Notes to patient: Not given in the hospital        Contact information for follow-up providers     Vida Rigger, MD Follow up in 2 week(s).   Specialty: Pulmonary Disease Contact information: [address] [place] [address] [phone]              Contact information for after-discharge care     Destination     [facility] .   Service: Skilled Nursing Contact information: [address] [place] [address] [phone]                    Discharge Exam: Filed Weights   06/18/23 0824  Weight: 57.9 kg   General -  Elderly cachectic Caucasian male, mild respiratory distress HEENT - PERRLA, EOMI, atraumatic head, non tender sinuses. Lung - Clear, diffuse rales, rhonchi Heart - S1, S2 heard, no murmurs, rubs, no pedal edema Neuro - Alert, awake and oriented x 3, non focal exam. Skin - Warm and dry.  Condition at discharge: stable  The results of significant diagnostics from this hospitalization (including imaging, microbiology, ancillary and laboratory) are listed below for reference.   Imaging Studies: CT Angio Chest Pulmonary Embolism (PE) W or WO Contrast  Result Date: 06/20/2023 CLINICAL DATA:  Dyspnea, tachypnea EXAM: CT ANGIOGRAPHY CHEST WITH CONTRAST TECHNIQUE: Multidetector CT imaging of the chest was performed using the standard  protocol during bolus administration of intravenous contrast. Multiplanar CT image reconstructions and MIPs were obtained to evaluate the vascular anatomy. RADIATION DOSE REDUCTION: This exam was performed according to the departmental dose-optimization program which includes automated exposure control, adjustment of the mA and/or kV according to patient size and/or use of iterative reconstruction technique. CONTRAST:  75mL OMNIPAQUE IOHEXOL 350 MG/ML SOLN COMPARISON:  Previous CT done on 06/17/2023, chest radiograph done today FINDINGS: Cardiovascular: There is homogeneous enhancement in thoracic aorta. There are no intraluminal filling defects seen central pulmonary artery branches. Evaluation of small peripheral pulmonary artery branches is limited by motion artifacts. Scattered coronary artery calcifications are seen. Minimal pericardial effusion is seen. Mediastinum/Nodes: There are slightly enlarged lymph nodes in mediastinum with no interval change. Calcified nodes are noted in subcarinal region. Lungs/Pleura: There is large infiltrate in left upper lobe with no significant interval change. Small linear densities in right parahilar region have not changed. Small linear patchy infiltrate in left lower lobe has not changed. Small left pleural effusion is seen. Minimal right pleural effusion is seen. Upper Abdomen: There is reflux of contrast into hepatic veins suggesting possible tricuspid insufficiency. Musculoskeletal: No acute findings are seen. Review of the MIP images confirms the above findings. IMPRESSION: There is no evidence of central pulmonary artery embolism. Evaluation of small peripheral branches is limited by motion artifacts. COPD. Large infiltrative left upper lobe has not changed significantly. Small linear infiltrate in the right parahilar region and small patchy infiltrate in left lower lobe have not changed significantly. There is interval appearance of small left pleural effusion and  minimal right pleural effusion. Electronically Signed   By: Ernie Avena M.D.   On: 06/20/2023 10:27   DG Chest Port 1 View  Result Date: 06/20/2023 CLINICAL DATA:  77 year old male with history of shortness of breath and wheezing. EXAM: PORTABLE CHEST 1 VIEW COMPARISON:  Chest x-ray 06/18/2023. FINDINGS: Extensive airspace consolidation in the left upper lobe again noted. Patchy areas of interstitial prominence and peribronchial cuffing noted elsewhere throughout the lungs bilaterally, similar to the prior study. No pleural effusions. No pneumothorax. No evidence of pulmonary edema. Heart size is normal. The patient is rotated to the right on today's exam, resulting in distortion of the mediastinal contours and reduced diagnostic sensitivity and specificity for mediastinal pathology. IMPRESSION: 1. Persistent left upper lobe pneumonia. 2. Diffuse interstitial prominence and peribronchial cuffing which suggests a background of bronchitis. Electronically Signed   By: Trudie Reed M.D.   On: 06/20/2023 07:45   DG Chest Port 1 View  Result Date: 06/18/2023 CLINICAL DATA:  Shortness of breath. EXAM: PORTABLE CHEST 1 VIEW COMPARISON:  Chest CTA 06/17/2023.  Chest x-ray 06/17/2023. FINDINGS: The left upper lobe pleuroparenchymal opacity is stable to mildly progressive in the interval. Calcified granuloma again noted in the lingula. Lungs are  hyperexpanded with diffuse chronic interstitial lung disease. No pleural effusion. The cardiopericardial silhouette is within normal limits for size. No acute bony abnormality. IMPRESSION: 1. Stable to mildly progressive left upper lobe pleuroparenchymal opacity compatible with pneumonia. 2. Hyperexpansion with chronic interstitial lung disease. Electronically Signed   By: Kennith Center M.D.   On: 06/18/2023 05:58   CT Angio Chest Pulmonary Embolism (PE) W or WO Contrast  Result Date: 06/17/2023 CLINICAL DATA:  Shortness of breath for several days, initial  encounter EXAM: CT ANGIOGRAPHY CHEST WITH CONTRAST TECHNIQUE: Multidetector CT imaging of the chest was performed using the standard protocol during bolus administration of intravenous contrast. Multiplanar CT image reconstructions and MIPs were obtained to evaluate the vascular anatomy. RADIATION DOSE REDUCTION: This exam was performed according to the departmental dose-optimization program which includes automated exposure control, adjustment of the mA and/or kV according to patient size and/or use of iterative reconstruction technique. CONTRAST:  75mL OMNIPAQUE IOHEXOL 350 MG/ML SOLN COMPARISON:  Plain film from earlier in the same day. FINDINGS: Cardiovascular: Atherosclerotic calcifications of the thoracic aorta are noted. No aneurysmal dilatation or dissection is seen. Heart is not significantly enlarged in size. Pulmonary artery shows a normal branching pattern bilaterally with the exception of the upper lobe on the left which demonstrates some attenuation related to the underlying scarring. No filling defects to suggest pulmonary emboli are noted. Mediastinum/Nodes: Thoracic inlet is within normal limits. Scattered small mediastinal lymph nodes are noted likely reactive in nature. Calcified nodes are noted within the mediastinum and left hilum similar to that seen on prior exam. The esophagus as visualized is within normal limits. Lungs/Pleura: Right lung demonstrates diffuse emphysematous changes. No focal infiltrate or effusion is seen. Some scarring in the posterolateral right upper lobe is seen stable from the prior exam. The left lower lobe demonstrates emphysematous changes. Calcified granuloma is noted within the lingula. Extensive acute infiltrate is noted within the left upper lobe superimposed over more chronic areas of scarring. No sizable effusion is seen. Upper Abdomen: Visualized upper abdomen is within normal limits. Musculoskeletal: Degenerative changes of the thoracic spine are noted. Review  of the MIP images confirms the above findings. IMPRESSION: No evidence of pulmonary emboli. New left upper lobe pneumonia superimposed over more chronic changes seen on prior exams. Changes consistent with prior granulomatous disease. Aortic Atherosclerosis (ICD10-I70.0) and Emphysema (ICD10-J43.9). Electronically Signed   By: Alcide Clever M.D.   On: 06/17/2023 20:57   DG Chest 2 View  Result Date: 06/17/2023 CLINICAL DATA:  Shortness of breath EXAM: CHEST - 2 VIEW COMPARISON:  03/22/2023 FINDINGS: Cardiac shadow is within normal limits. Lungs are well aerated bilaterally. Calcified granuloma is again noted in the left mid lung. Considerable increased airspace opacity is noted in the left upper lobe new from the prior exam. This is superimposed over areas of previous scarring in the left upper lobe. Degenerative changes of the thoracic spine are noted. IMPRESSION: Left upper lobe pneumonia. Electronically Signed   By: Alcide Clever M.D.   On: 06/17/2023 20:17    Microbiology: Results for orders placed or performed during the hospital encounter of 06/17/23  Blood culture (routine x 2)     Status: None   Collection Time: 06/17/23 10:36 PM   Specimen: BLOOD LEFT ARM  Result Value Ref [place] Status   Specimen Description BLOOD LEFT ARM  Final   Special Requests   Final    BOTTLES DRAWN AEROBIC AND ANAEROBIC Blood Culture adequate volume   Culture  Final    NO GROWTH 5 DAYS Performed at [facility], [address] [place] [place], [place] [address]    Report Status 06/22/2023 FINAL  Final  Blood culture (routine x 2)     Status: None   Collection Time: 06/17/23 10:36 PM   Specimen: BLOOD RIGHT ARM  Result Value Ref [place] Status   Specimen Description BLOOD RIGHT ARM  Final   Special Requests   Final    BOTTLES DRAWN AEROBIC AND ANAEROBIC Blood Culture adequate volume   Culture   Final    NO GROWTH 5 DAYS Performed at [facility], [address] [place], [place] [address]     Report Status 06/22/2023 FINAL  Final  SARS Coronavirus 2 by RT PCR (hospital order, performed in [facility] hospital lab) *cepheid single result test* Anterior Nasal Swab     Status: None   Collection Time: 06/17/23 11:19 PM   Specimen: Anterior Nasal Swab  Result Value Ref [place] Status   SARS Coronavirus 2 by RT PCR NEGATIVE NEGATIVE Final    Comment: (NOTE) SARS-CoV-2 target nucleic acids are NOT DETECTED.  The SARS-CoV-2 RNA is generally detectable in upper and lower respiratory specimens during the acute phase of infection. The lowest concentration of SARS-CoV-2 viral copies this assay can detect is 250 copies / mL. A negative result does not preclude SARS-CoV-2 infection and should not be used as the sole basis for treatment or other patient management decisions.  A negative result may occur with improper specimen collection / handling, submission of specimen other than nasopharyngeal swab, presence of viral mutation(s) within the areas targeted by this assay, and inadequate number of viral copies (<250 copies / mL). A negative result must be combined with clinical observations, patient history, and epidemiological information.  Fact Sheet for Patients:   RoadLapTop.co.za  Fact Sheet for Healthcare Providers: http://kim-miller.com/  This test is not yet approved or  cleared by the [place] FDA and has been authorized for detection and/or diagnosis of SARS-CoV-2 by FDA under an Emergency Use Authorization (EUA).  This EUA will remain in effect (meaning this test can be used) for the duration of the COVID-19 declaration under Section 564(b)(1) of the Act, 21 U.S.C. section 360bbb-3(b)(1), unless the authorization is terminated or revoked sooner.  Performed at [facility], [address] [place], [place] [address]   Expectorated Sputum Assessment w Gram Stain, Rflx to Resp Cult     Status: None   Collection Time:  06/18/23 10:31 AM   Specimen: Sputum  Result Value Ref [place] Status   Specimen Description SPUTUM  Final   Special Requests NONE  Final   Sputum evaluation   Final    THIS SPECIMEN IS ACCEPTABLE FOR SPUTUM CULTURE Performed at [facility], [address], [place], [place] [address]    Report Status 06/18/2023 FINAL  Final  Respiratory (~20 pathogens) panel by PCR     Status: None   Collection Time: 06/18/23 10:31 AM   Specimen: Expectorated Sputum; Respiratory  Result Value Ref [place] Status   Adenovirus NOT DETECTED NOT DETECTED Final   Coronavirus 229E NOT DETECTED NOT DETECTED Final    Comment: (NOTE) The Coronavirus on the Respiratory Panel, DOES NOT test for the novel  Coronavirus (2019 nCoV)    Coronavirus HKU1 NOT DETECTED NOT DETECTED Final   Coronavirus NL63 NOT DETECTED NOT DETECTED Final   Coronavirus OC43 NOT DETECTED NOT DETECTED Final   Metapneumovirus NOT DETECTED NOT DETECTED Final  Rhinovirus / Enterovirus NOT DETECTED NOT DETECTED Final   Influenza A NOT DETECTED NOT DETECTED Final   Influenza B NOT DETECTED NOT DETECTED Final   Parainfluenza Virus 1 NOT DETECTED NOT DETECTED Final   Parainfluenza Virus 2 NOT DETECTED NOT DETECTED Final   Parainfluenza Virus 3 NOT DETECTED NOT DETECTED Final   Parainfluenza Virus 4 NOT DETECTED NOT DETECTED Final   Respiratory Syncytial Virus NOT DETECTED NOT DETECTED Final   Bordetella pertussis NOT DETECTED NOT DETECTED Final   Bordetella Parapertussis NOT DETECTED NOT DETECTED Final   Chlamydophila pneumoniae NOT DETECTED NOT DETECTED Final   Mycoplasma pneumoniae NOT DETECTED NOT DETECTED Final    Comment: Performed at [facility], [address] [place], [place] [address]  Culture, Respiratory w Gram Stain     Status: None   Collection Time: 06/18/23 10:31 AM   Specimen: SPU  Result Value Ref [place] Status   Specimen Description   Final    SPUTUM Performed at [facility], [address] [place], [place] [address]    Special Requests   Final    NONE Reflexed from [id number] Performed at [facility], [address] [place], [place] [address]    Gram Stain   Final    FEW WBC PRESENT,BOTH PMN AND MONONUCLEAR RARE GRAM POSITIVE COCCI IN PAIRS Performed at [facility], [address], [place], [place] [address]    Culture RARE STENOTROPHOMONAS MALTOPHILIA  Final   Report Status 06/22/2023 FINAL  Final   Organism ID, Bacteria STENOTROPHOMONAS MALTOPHILIA  Final      Susceptibility   Stenotrophomonas maltophilia - MIC*    LEVOFLOXACIN 0.25 SENSITIVE Sensitive     TRIMETH/SULFA <=20 SENSITIVE Sensitive     * RARE STENOTROPHOMONAS MALTOPHILIA    Labs: CBC: Recent Labs  Lab 06/18/23 0753 06/19/23 0749 06/20/23 0652 06/21/23 0533 06/22/23 0613 06/22/23 1650 06/23/23 0348  WBC 30.2*   < > 24.0* 15.4* 16.6* 14.5* 13.9*  NEUTROABS 27.8*  --   --   --   --   --   --   HGB 13.0   < > 13.3 10.9* 10.7* 10.7* 10.8*  HCT 38.9*   < > 40.6 33.1* 32.2* 31.7* 31.9*  MCV 94.2   < > 95.8 95.7 94.4 93.0 92.7  PLT 416*   < > 403* 285 299 294 314   < > = values in this interval not displayed.   Basic Metabolic Panel: Recent Labs  Lab 06/18/23 0901 06/19/23 0749 06/20/23 0652 06/21/23 0533 06/22/23 0613 06/23/23 0348  NA 131* 134* 138 136 135 137  K 3.5 3.4* 3.7 3.2* 3.2* 3.0*  CL 96* 99 100 100 97* 99  CO2 27 27 31 31 28 30   GLUCOSE 123* 112* 103* 115* 94 106*  BUN 20 13 10 9 9 12   CREATININE 0.59* 0.48* 0.51* 0.47* 0.43* 0.49*  CALCIUM 7.7* 8.0* 8.2* 7.4* 7.5* 7.7*  MG 2.3  --   --   --   --   --   PHOS 2.7  --   --   --   --   --    Liver Function Tests: Recent Labs  Lab 06/17/23 1930  AST 29  ALT 15  ALKPHOS 138*  BILITOT 0.6  PROT 7.3  ALBUMIN 2.7*   CBG: No results for input(s): "GLUCAP" in the last 168 hours.  Discharge time spent: greater than 30 minutes.  Signed: Marcelino Duster, MD [facility] 06/23/2023

## 2023-06-23 NOTE — Plan of Care (Signed)
Patient discharged per MD orders at this time.All discharge instructions, education and medications reviewed with the patient.Pt expressed understanding and will comply with dc instructions.follow up appointments was also communicated to the patient.no verbal c/o or any ssx of distress.Pt was discharged to the [facility] for STR/PT/OT services per order.report was called to staff nurse Johnny Bridge before transport.Pt was transported by 2 [facility] personnel on a stretcher.

## 2023-06-28 ENCOUNTER — Other Ambulatory Visit: Payer: Self-pay

## 2023-06-28 ENCOUNTER — Emergency Department: Payer: Medicare Other

## 2023-06-28 ENCOUNTER — Inpatient Hospital Stay
Admission: EM | Admit: 2023-06-28 | Discharge: 2023-07-30 | DRG: 871 | Disposition: E | Payer: Medicare Other | Attending: Internal Medicine | Admitting: Internal Medicine

## 2023-06-28 DIAGNOSIS — Z91040 Latex allergy status: Secondary | ICD-10-CM

## 2023-06-28 DIAGNOSIS — Z681 Body mass index (BMI) 19 or less, adult: Secondary | ICD-10-CM | POA: Diagnosis not present

## 2023-06-28 DIAGNOSIS — Z1152 Encounter for screening for COVID-19: Secondary | ICD-10-CM

## 2023-06-28 DIAGNOSIS — J440 Chronic obstructive pulmonary disease with acute lower respiratory infection: Secondary | ICD-10-CM | POA: Diagnosis present

## 2023-06-28 DIAGNOSIS — Z789 Other specified health status: Secondary | ICD-10-CM

## 2023-06-28 DIAGNOSIS — J9691 Respiratory failure, unspecified with hypoxia: Secondary | ICD-10-CM | POA: Diagnosis not present

## 2023-06-28 DIAGNOSIS — J9622 Acute and chronic respiratory failure with hypercapnia: Secondary | ICD-10-CM | POA: Diagnosis present

## 2023-06-28 DIAGNOSIS — A419 Sepsis, unspecified organism: Principal | ICD-10-CM | POA: Diagnosis present

## 2023-06-28 DIAGNOSIS — J851 Abscess of lung with pneumonia: Secondary | ICD-10-CM | POA: Diagnosis present

## 2023-06-28 DIAGNOSIS — J441 Chronic obstructive pulmonary disease with (acute) exacerbation: Secondary | ICD-10-CM | POA: Diagnosis present

## 2023-06-28 DIAGNOSIS — Z87891 Personal history of nicotine dependence: Secondary | ICD-10-CM

## 2023-06-28 DIAGNOSIS — J189 Pneumonia, unspecified organism: Secondary | ICD-10-CM | POA: Diagnosis not present

## 2023-06-28 DIAGNOSIS — J850 Gangrene and necrosis of lung: Secondary | ICD-10-CM | POA: Diagnosis present

## 2023-06-28 DIAGNOSIS — Z9981 Dependence on supplemental oxygen: Secondary | ICD-10-CM

## 2023-06-28 DIAGNOSIS — R6520 Severe sepsis without septic shock: Secondary | ICD-10-CM | POA: Diagnosis present

## 2023-06-28 DIAGNOSIS — I2699 Other pulmonary embolism without acute cor pulmonale: Secondary | ICD-10-CM | POA: Diagnosis not present

## 2023-06-28 DIAGNOSIS — Z515 Encounter for palliative care: Secondary | ICD-10-CM

## 2023-06-28 DIAGNOSIS — E43 Unspecified severe protein-calorie malnutrition: Secondary | ICD-10-CM | POA: Diagnosis present

## 2023-06-28 DIAGNOSIS — Z96643 Presence of artificial hip joint, bilateral: Secondary | ICD-10-CM | POA: Diagnosis present

## 2023-06-28 DIAGNOSIS — I824Z3 Acute embolism and thrombosis of unspecified deep veins of distal lower extremity, bilateral: Secondary | ICD-10-CM | POA: Diagnosis present

## 2023-06-28 DIAGNOSIS — Z7901 Long term (current) use of anticoagulants: Secondary | ICD-10-CM

## 2023-06-28 DIAGNOSIS — I739 Peripheral vascular disease, unspecified: Secondary | ICD-10-CM | POA: Diagnosis present

## 2023-06-28 DIAGNOSIS — I825Z1 Chronic embolism and thrombosis of unspecified deep veins of right distal lower extremity: Secondary | ICD-10-CM | POA: Diagnosis not present

## 2023-06-28 DIAGNOSIS — Z801 Family history of malignant neoplasm of trachea, bronchus and lung: Secondary | ICD-10-CM

## 2023-06-28 DIAGNOSIS — Z79899 Other long term (current) drug therapy: Secondary | ICD-10-CM

## 2023-06-28 DIAGNOSIS — R319 Hematuria, unspecified: Secondary | ICD-10-CM | POA: Diagnosis not present

## 2023-06-28 DIAGNOSIS — J9602 Acute respiratory failure with hypercapnia: Secondary | ICD-10-CM | POA: Diagnosis not present

## 2023-06-28 DIAGNOSIS — I2609 Other pulmonary embolism with acute cor pulmonale: Secondary | ICD-10-CM | POA: Diagnosis not present

## 2023-06-28 DIAGNOSIS — I272 Pulmonary hypertension, unspecified: Secondary | ICD-10-CM | POA: Diagnosis present

## 2023-06-28 DIAGNOSIS — I2693 Single subsegmental pulmonary embolism without acute cor pulmonale: Secondary | ICD-10-CM | POA: Diagnosis present

## 2023-06-28 DIAGNOSIS — Z86718 Personal history of other venous thrombosis and embolism: Secondary | ICD-10-CM

## 2023-06-28 DIAGNOSIS — J9621 Acute and chronic respiratory failure with hypoxia: Secondary | ICD-10-CM | POA: Diagnosis present

## 2023-06-28 DIAGNOSIS — Z86711 Personal history of pulmonary embolism: Secondary | ICD-10-CM

## 2023-06-28 DIAGNOSIS — Z85828 Personal history of other malignant neoplasm of skin: Secondary | ICD-10-CM

## 2023-06-28 DIAGNOSIS — J439 Emphysema, unspecified: Secondary | ICD-10-CM | POA: Diagnosis present

## 2023-06-28 DIAGNOSIS — Y95 Nosocomial condition: Secondary | ICD-10-CM | POA: Diagnosis present

## 2023-06-28 DIAGNOSIS — J9601 Acute respiratory failure with hypoxia: Secondary | ICD-10-CM | POA: Diagnosis not present

## 2023-06-28 DIAGNOSIS — J449 Chronic obstructive pulmonary disease, unspecified: Secondary | ICD-10-CM | POA: Diagnosis present

## 2023-06-28 DIAGNOSIS — Z66 Do not resuscitate: Secondary | ICD-10-CM | POA: Diagnosis not present

## 2023-06-28 DIAGNOSIS — Z91048 Other nonmedicinal substance allergy status: Secondary | ICD-10-CM

## 2023-06-28 DIAGNOSIS — E871 Hypo-osmolality and hyponatremia: Secondary | ICD-10-CM | POA: Diagnosis present

## 2023-06-28 DIAGNOSIS — I50810 Right heart failure, unspecified: Secondary | ICD-10-CM | POA: Diagnosis not present

## 2023-06-28 DIAGNOSIS — G40909 Epilepsy, unspecified, not intractable, without status epilepticus: Secondary | ICD-10-CM | POA: Diagnosis present

## 2023-06-28 DIAGNOSIS — M170 Bilateral primary osteoarthritis of knee: Secondary | ICD-10-CM | POA: Diagnosis present

## 2023-06-28 DIAGNOSIS — Z8249 Family history of ischemic heart disease and other diseases of the circulatory system: Secondary | ICD-10-CM

## 2023-06-28 DIAGNOSIS — R627 Adult failure to thrive: Secondary | ICD-10-CM | POA: Diagnosis present

## 2023-06-28 DIAGNOSIS — I10 Essential (primary) hypertension: Secondary | ICD-10-CM | POA: Diagnosis present

## 2023-06-28 DIAGNOSIS — Z7189 Other specified counseling: Secondary | ICD-10-CM | POA: Diagnosis not present

## 2023-06-28 DIAGNOSIS — E8809 Other disorders of plasma-protein metabolism, not elsewhere classified: Secondary | ICD-10-CM | POA: Diagnosis present

## 2023-06-28 DIAGNOSIS — Z7951 Long term (current) use of inhaled steroids: Secondary | ICD-10-CM

## 2023-06-28 HISTORY — DX: Long term (current) use of anticoagulants: Z79.01

## 2023-06-28 HISTORY — DX: Acute embolism and thrombosis of unspecified deep veins of lower extremity, bilateral: I82.403

## 2023-06-28 HISTORY — DX: Chronic obstructive pulmonary disease, unspecified: J44.9

## 2023-06-28 HISTORY — DX: Dependence on supplemental oxygen: Z99.81

## 2023-06-28 HISTORY — DX: Unspecified severe protein-calorie malnutrition: E43

## 2023-06-28 HISTORY — DX: Other malaise: R53.81

## 2023-06-28 LAB — BLOOD GAS, VENOUS
Acid-Base Excess: 4.2 mmol/L — ABNORMAL HIGH (ref 0.0–2.0)
Bicarbonate: 30.3 mmol/L — ABNORMAL HIGH (ref 20.0–28.0)
O2 Saturation: 80.6 %
Patient temperature: 37
pCO2, Ven: 50 mmHg (ref 44–60)
pH, Ven: 7.39 (ref 7.25–7.43)
pO2, Ven: 51 mmHg — ABNORMAL HIGH (ref 32–45)

## 2023-06-28 LAB — PROTIME-INR
INR: 1.9 — ABNORMAL HIGH (ref 0.8–1.2)
Prothrombin Time: 21.6 seconds — ABNORMAL HIGH (ref 11.4–15.2)

## 2023-06-28 LAB — RESP PANEL BY RT-PCR (FLU A&B, COVID) ARPGX2
Influenza A by PCR: NEGATIVE
Influenza B by PCR: NEGATIVE
SARS Coronavirus 2 by RT PCR: NEGATIVE

## 2023-06-28 LAB — URINALYSIS, W/ REFLEX TO CULTURE (INFECTION SUSPECTED)
Bacteria, UA: NONE SEEN
Bilirubin Urine: NEGATIVE
Glucose, UA: NEGATIVE mg/dL
Hgb urine dipstick: NEGATIVE
Ketones, ur: 5 mg/dL — AB
Leukocytes,Ua: NEGATIVE
Nitrite: NEGATIVE
Protein, ur: 30 mg/dL — AB
Specific Gravity, Urine: >1.046 — ABNORMAL HIGH (ref 1.005–1.030)
pH: 5 (ref 5.0–8.0)

## 2023-06-28 LAB — PROCALCITONIN: Procalcitonin: 0.15 ng/mL

## 2023-06-28 LAB — CBC WITH DIFFERENTIAL/PLATELET
Abs Immature Granulocytes: 0.54 10*3/uL — ABNORMAL HIGH (ref 0.00–0.07)
Basophils Absolute: 0.1 10*3/uL (ref 0.0–0.1)
Basophils Relative: 1 %
Eosinophils Absolute: 0.1 10*3/uL (ref 0.0–0.5)
Eosinophils Relative: 1 %
HCT: 37.3 % — ABNORMAL LOW (ref 39.0–52.0)
Hemoglobin: 12 g/dL — ABNORMAL LOW (ref 13.0–17.0)
Immature Granulocytes: 3 %
Lymphocytes Relative: 10 %
Lymphs Abs: 1.9 10*3/uL (ref 0.7–4.0)
MCH: 30.5 pg (ref 26.0–34.0)
MCHC: 32.2 g/dL (ref 30.0–36.0)
MCV: 94.7 fL (ref 80.0–100.0)
Monocytes Absolute: 1.1 10*3/uL — ABNORMAL HIGH (ref 0.1–1.0)
Monocytes Relative: 6 %
Neutro Abs: 14.8 10*3/uL — ABNORMAL HIGH (ref 1.7–7.7)
Neutrophils Relative %: 79 %
Platelets: 506 10*3/uL — ABNORMAL HIGH (ref 150–400)
RBC: 3.94 MIL/uL — ABNORMAL LOW (ref 4.22–5.81)
RDW: 12.7 % (ref 11.5–15.5)
WBC: 18.6 10*3/uL — ABNORMAL HIGH (ref 4.0–10.5)
nRBC: 0 % (ref 0.0–0.2)

## 2023-06-28 LAB — COMPREHENSIVE METABOLIC PANEL
ALT: 17 U/L (ref 0–44)
AST: 20 U/L (ref 15–41)
Albumin: 2.3 g/dL — ABNORMAL LOW (ref 3.5–5.0)
Alkaline Phosphatase: 125 U/L (ref 38–126)
Anion gap: 6 (ref 5–15)
BUN: 16 mg/dL (ref 8–23)
CO2: 28 mmol/L (ref 22–32)
Calcium: 7.7 mg/dL — ABNORMAL LOW (ref 8.9–10.3)
Chloride: 96 mmol/L — ABNORMAL LOW (ref 98–111)
Creatinine, Ser: 0.54 mg/dL — ABNORMAL LOW (ref 0.61–1.24)
GFR, Estimated: >60 mL/min (ref >60)
Glucose, Bld: 187 mg/dL — ABNORMAL HIGH (ref 70–99)
Potassium: 3.9 mmol/L (ref 3.5–5.1)
Sodium: 130 mmol/L — ABNORMAL LOW (ref 135–145)
Total Bilirubin: 0.4 mg/dL (ref 0.3–1.2)
Total Protein: 7.5 g/dL (ref 6.5–8.1)

## 2023-06-28 LAB — LACTIC ACID, PLASMA
Lactic Acid, Venous: 1.6 mmol/L (ref 0.5–1.9)
Lactic Acid, Venous: 1.4 mmol/L (ref 0.5–1.9)

## 2023-06-28 LAB — GLUCOSE, CAPILLARY: Glucose-Capillary: 143 mg/dL — ABNORMAL HIGH (ref 70–99)

## 2023-06-28 LAB — BRAIN NATRIURETIC PEPTIDE: B Natriuretic Peptide: 194.1 pg/mL — ABNORMAL HIGH (ref 0.0–100.0)

## 2023-06-28 LAB — TROPONIN I (HIGH SENSITIVITY): Troponin I (High Sensitivity): 14 ng/L (ref <18)

## 2023-06-28 MED ORDER — SODIUM CHLORIDE 0.9 % IV SOLN: 500.0000 mg | INTRAVENOUS | Status: DC

## 2023-06-28 MED ORDER — IPRATROPIUM-ALBUTEROL 0.5-2.5 (3) MG/3ML IN SOLN
RESPIRATORY_TRACT | Status: AC
  Filled 2023-06-28: qty 3

## 2023-06-28 MED ORDER — ALBUTEROL SULFATE HFA 108 (90 BASE) MCG/ACT IN AERS: 2.0000 | INHALATION_SPRAY | RESPIRATORY_TRACT | Status: DC | PRN

## 2023-06-28 MED ORDER — IPRATROPIUM-ALBUTEROL 0.5-2.5 (3) MG/3ML IN SOLN
3.0000 mL | Freq: Once | RESPIRATORY_TRACT | Status: AC
  Administered 2023-06-28: 3 mL via RESPIRATORY_TRACT

## 2023-06-28 MED ORDER — HEPARIN (PORCINE) 25000 UT/250ML-% IV SOLN
1550.0000 [IU]/h | INTRAVENOUS | Status: DC
  Administered 2023-06-28: 950 [IU]/h via INTRAVENOUS
  Administered 2023-06-29: 1350 [IU]/h via INTRAVENOUS
  Filled 2023-06-28 (×2): qty 250

## 2023-06-28 MED ORDER — ACETAMINOPHEN 650 MG RE SUPP: 650.0000 mg | Freq: Four times a day (QID) | RECTAL | Status: DC | PRN

## 2023-06-28 MED ORDER — ONDANSETRON HCL 4 MG PO TABS: 4.0000 mg | ORAL_TABLET | Freq: Four times a day (QID) | ORAL | Status: DC | PRN

## 2023-06-28 MED ORDER — BISACODYL 5 MG PO TBEC: 5.0000 mg | DELAYED_RELEASE_TABLET | Freq: Every day | ORAL | Status: DC | PRN

## 2023-06-28 MED ORDER — METHYLPREDNISOLONE SODIUM SUCC 125 MG IJ SOLR
125.0000 mg | INTRAMUSCULAR | Status: AC
  Administered 2023-06-28: 125 mg via INTRAVENOUS
  Filled 2023-06-28: qty 2

## 2023-06-28 MED ORDER — PIPERACILLIN-TAZOBACTAM 3.375 G IVPB
3.3750 g | Freq: Three times a day (TID) | INTRAVENOUS | Status: DC
  Administered 2023-06-29 – 2023-07-03 (×14): 3.375 g via INTRAVENOUS
  Filled 2023-06-28 (×14): qty 50

## 2023-06-28 MED ORDER — POLYETHYLENE GLYCOL 3350 17 G PO PACK: 17.0000 g | PACK | Freq: Every day | ORAL | Status: DC | PRN

## 2023-06-28 MED ORDER — ONDANSETRON HCL 4 MG/2ML IJ SOLN: 4.0000 mg | Freq: Four times a day (QID) | INTRAMUSCULAR | Status: DC | PRN

## 2023-06-28 MED ORDER — SODIUM CHLORIDE 0.9 % IV SOLN
2.0000 g | Freq: Three times a day (TID) | INTRAVENOUS | Status: DC
  Filled 2023-06-28: qty 12.5

## 2023-06-28 MED ORDER — DOCUSATE SODIUM 100 MG PO CAPS
100.0000 mg | ORAL_CAPSULE | Freq: Two times a day (BID) | ORAL | Status: DC
  Administered 2023-06-29 – 2023-07-01 (×2): 100 mg via ORAL
  Filled 2023-06-28 (×4): qty 1

## 2023-06-28 MED ORDER — WARFARIN - PHARMACIST DOSING INPATIENT
Freq: Every day | Status: DC
  Filled 2023-06-28: qty 1

## 2023-06-28 MED ORDER — ACETAMINOPHEN 325 MG PO TABS: 650.0000 mg | ORAL_TABLET | Freq: Four times a day (QID) | ORAL | Status: DC | PRN

## 2023-06-28 MED ORDER — SODIUM CHLORIDE 0.9 % IV SOLN: INTRAVENOUS | Status: AC

## 2023-06-28 MED ORDER — SODIUM CHLORIDE 0.9% FLUSH
3.0000 mL | Freq: Two times a day (BID) | INTRAVENOUS | Status: DC
  Administered 2023-06-28 – 2023-07-03 (×10): 3 mL via INTRAVENOUS

## 2023-06-28 MED ORDER — SODIUM CHLORIDE 0.9 % IV SOLN
2.0000 g | Freq: Once | INTRAVENOUS | Status: AC
  Administered 2023-06-28: 2 g via INTRAVENOUS
  Filled 2023-06-28: qty 12.5

## 2023-06-28 MED ORDER — LACTATED RINGERS IV SOLN: INTRAVENOUS | Status: AC

## 2023-06-28 MED ORDER — HYDRALAZINE HCL 20 MG/ML IJ SOLN: 5.0000 mg | INTRAMUSCULAR | Status: DC | PRN

## 2023-06-28 MED ORDER — VANCOMYCIN HCL 1250 MG/250ML IV SOLN
1250.0000 mg | Freq: Once | INTRAVENOUS | Status: AC
  Administered 2023-06-28: 1250 mg via INTRAVENOUS
  Filled 2023-06-28: qty 250

## 2023-06-28 MED ORDER — PANTOPRAZOLE SODIUM 40 MG IV SOLR
40.0000 mg | INTRAVENOUS | Status: DC
  Administered 2023-06-29 – 2023-07-03 (×6): 40 mg via INTRAVENOUS
  Filled 2023-06-28 (×6): qty 10

## 2023-06-28 MED ORDER — CARBAMAZEPINE 200 MG PO TABS
200.0000 mg | ORAL_TABLET | Freq: Three times a day (TID) | ORAL | Status: DC
  Administered 2023-06-29: 200 mg via ORAL
  Filled 2023-06-28 (×2): qty 1

## 2023-06-28 MED ORDER — SODIUM CHLORIDE 0.9 % IV SOLN
500.0000 mg | Freq: Once | INTRAVENOUS | Status: AC
  Administered 2023-06-28: 500 mg via INTRAVENOUS
  Filled 2023-06-28: qty 5

## 2023-06-28 MED ORDER — IPRATROPIUM-ALBUTEROL 0.5-2.5 (3) MG/3ML IN SOLN: 3.0000 mL | Freq: Once | RESPIRATORY_TRACT | Status: DC

## 2023-06-28 MED ORDER — HEPARIN SODIUM (PORCINE) 5000 UNIT/ML IJ SOLN: 5000.0000 [IU] | Freq: Three times a day (TID) | INTRAMUSCULAR | Status: DC

## 2023-06-28 MED ORDER — DULOXETINE HCL 30 MG PO CPEP
30.0000 mg | ORAL_CAPSULE | Freq: Every day | ORAL | Status: DC
  Administered 2023-06-29: 30 mg via ORAL
  Filled 2023-06-28 (×2): qty 1

## 2023-06-28 MED ORDER — IPRATROPIUM-ALBUTEROL 0.5-2.5 (3) MG/3ML IN SOLN: 3.0000 mL | Freq: Four times a day (QID) | RESPIRATORY_TRACT | Status: DC | PRN

## 2023-06-28 MED ORDER — IOHEXOL 350 MG/ML SOLN
75.0000 mL | Freq: Once | INTRAVENOUS | Status: AC | PRN
  Administered 2023-06-28: 75 mL via INTRAVENOUS

## 2023-06-28 MED ORDER — WARFARIN SODIUM 5 MG PO TABS: 5.0000 mg | ORAL_TABLET | Freq: Every day | ORAL | Status: DC

## 2023-06-28 MED ORDER — VANCOMYCIN HCL IN DEXTROSE 1-5 GM/200ML-% IV SOLN: 1000.0000 mg | Freq: Once | INTRAVENOUS | Status: DC

## 2023-06-28 MED ORDER — VANCOMYCIN HCL 1250 MG/250ML IV SOLN
1250.0000 mg | INTRAVENOUS | Status: DC
  Filled 2023-06-28: qty 250

## 2023-06-28 MED ORDER — GUAIFENESIN ER 600 MG PO TB12: 600.0000 mg | ORAL_TABLET | Freq: Two times a day (BID) | ORAL | Status: DC | PRN

## 2023-06-28 MED ORDER — ALBUTEROL SULFATE (2.5 MG/3ML) 0.083% IN NEBU
2.5000 mg | INHALATION_SOLUTION | RESPIRATORY_TRACT | Status: DC | PRN
  Filled 2023-06-28: qty 3

## 2023-06-28 MED ORDER — CHLORHEXIDINE GLUCONATE CLOTH 2 % EX PADS
6.0000 | MEDICATED_PAD | Freq: Every day | CUTANEOUS | Status: DC
  Administered 2023-06-29 – 2023-07-03 (×5): 6 via TOPICAL

## 2023-06-28 NOTE — ED Provider Notes (Signed)
CT Angio Chest Pulmonary Embolism (PE) W or WO Contrast  Result Date: 06/20/2023 CLINICAL DATA:  High probability for PE.  Shortness of breath. EXAM: CT ANGIOGRAPHY CHEST WITH CONTRAST TECHNIQUE: Multidetector CT imaging of the chest was performed using the standard protocol during bolus administration of intravenous contrast. Multiplanar CT image reconstructions and MIPs were obtained to evaluate the vascular anatomy. RADIATION DOSE REDUCTION: This exam was performed according to the departmental dose-optimization program which includes automated exposure control, adjustment of the mA and/or kV according to patient size and/or use of iterative reconstruction technique. CONTRAST:  75mL OMNIPAQUE IOHEXOL 350 MG/ML SOLN COMPARISON:  CT angiogram chest 06/20/2023 FINDINGS: Cardiovascular: There is adequate opacification of the pulmonary arteries. Pulmonary emboli are seen within the distal right main pulmonary artery extending into segmental and subsegmental branches of the right upper lobe. There also segmental right lower lobe and right middle lobe pulmonary emboli. Heart is normal in size. No evidence for aortic aneurysm. There are atherosclerotic calcifications of the aorta. There is no pericardial effusion. Mediastinum/Nodes: No enlarged mediastinal, hilar, or axillary lymph nodes. Thyroid gland, trachea, and esophagus demonstrate no significant findings. Lungs/Pleura: Large left upper lobe infiltrate with air bronchograms again seen. There is a new air-fluid level in the anterior apex measuring 3.5 x 4.5 by 2.0 cm. Focal spiculated density in the posterior left lower lobe appears unchanged. Lingular airspace disease has decreased, but there is some persistent ground-glass opacity in this region. Linear opacity with ground-glass in the right upper lobe appears unchanged. Emphysematous changes are again noted. No pleural effusion or pneumothorax. Upper Abdomen: No acute findings. There is a right renal cyst  measuring 2.1 cm. Musculoskeletal: Multilevel degenerative changes affect the spine. Review of the MIP images confirms the above findings. IMPRESSION: 1. Acute pulmonary emboli in the distal right main pulmonary artery extending into segmental and subsegmental branches of the right upper lobe. Additional segmental and subsegmental pulmonary emboli in the right lower lobe and right middle lobe. Positive for acute PE with CT evidence of right heart strain (RV/LV Ratio = 1.3) consistent with at least submassive (intermediate risk) PE. The presence of right heart strain has been associated with an increased risk of morbidity and mortality. 2. New air-fluid level in the left apex worrisome for abscess. 3. Left upper lobe infiltrate otherwise appears unchanged. 4. Lingular airspace disease has decreased, but there is some persistent ground-glass opacity in this region. 5. Spiculated density in the left lower lobe is unchanged. 6. Stable ground-glass opacity in the right upper lobe. Electronically Signed   By: Darliss Cheney M.D.   On: 06/16/2023 22:28   DG Chest 1 View  Result Date: 06/23/2023 CLINICAL DATA:  History of pneumonia, sepsis EXAM: CHEST  1 VIEW COMPARISON:  06/20/2023 FINDINGS: 2 frontal views of the chest demonstrate a stable cardiac silhouette. There is persistent dense consolidation within the left upper lobe, with superior retraction of the left hilum consistent with volume loss. Airspace disease in the left perihilar region has slightly improved. Right chest is clear. Stable hyperinflation and emphysema. No effusion or pneumothorax. No acute bony abnormalities. IMPRESSION: 1. Persistent dense consolidation within the left upper lobe, compatible with pneumonia. The patchy areas of airspace disease in the left perihilar region seen on prior chest x-ray demonstrate mild improvement. 2. Stable emphysema. Electronically Signed   By: Sharlet Salina M.D.   On: 06/18/2023 19:28      Sharyn Creamer,  MD 07/01/23 [phone]

## 2023-06-28 NOTE — Progress Notes (Signed)
CT-scan of the chest shows LUNG abscess/ Submassive PE with right heart strain.  PNA. Pulmonary consulted.  Pt admitted to stepdown.

## 2023-06-28 NOTE — H&P (Signed)
History and Physical    Patient: Jorge Rosales ZOX:[id number] DOB: 1946/08/28 DOA: 06/28/2023 DOS: the patient was seen and examined on 06/28/2023 PCP: [facility]  Patient coming from: Home   Chief Complaint:  Chief Complaint  Patient presents with   Shortness of Breath    HPI: Jorge Rosales is a 77 y.o. male with medical history significant for COPD on home O2 at 2 L, former smoker, pulmonary hypertension, PE in 2022 on warfarin anticoagulation, who presents to the ED  with c/o SOB.  Patient states that shortness of breath has been progressively getting worse so over the past few days at rehab.  Patient has a history of tobacco but no longer smokes.  No chest pain palpitations fevers chills nausea vomiting bleeding seizures gait falls or any other issues.  When asked patient does report he has been having lower extremity swelling.  That he typically does not have but that is new.  States that he was off of Coumadin for an elevated INR 12 and then was restarted and he takes Coumadin because he takes carbamazepine for seizure that he had just once is not sure if he needs to be will continue the carbamazepine has not seen a neurologist in the past 8 years or so. Pt is on Bipap as he was dyspneic and hypoxic for oxygenation. Daughter states he also has  COPD and so thought his SOB was related to that. In ed pt is on A/O and afebrile on bipap. Ill appearing. Thin underweight. Vitals:   06/28/23 1930 06/28/23 2000 06/28/23 2030 06/28/23 2100  BP: (!) 140/75 120/71 (!) 151/80 (!) 144/90  Pulse: (!) 103 (!) 110 (!) 107 (!) 107  Temp:      Resp: (!) 27 (!) 26 (!) 24 (!) 25  Height:      Weight:      SpO2: 97% 93% 98% 99%  TempSrc:      BMI (Calculated):      Blood work shows: Venous blood gas shows pO2 of 51. CMP shows hyponatremia of 130 chloride 96 glucose 187 normal kidney function hypocalcemia of 7.7 normal LFTs. BNP of 194.1 troponin of 14, EKG shows sinus rhythm with a QRS of  80 heart rate of 100 QTc of 440 no ST-T wave changes. Chest x-ray done today shows the same left upper lobe consolidation infiltrate prominent pattern seen on previous chest x-ray. CT angio chest done with results pending. Admission requested for Sepsis/ respiratory failure and HCAP PNA  Pt had an echo in June 2024 showing:   1. Left ventricular ejection fraction, by estimation, is 60 to 65%. The  left ventricle has normal function. The left ventricle has no regional  wall motion abnormalities. Left ventricular diastolic parameters are  consistent with Grade I diastolic  dysfunction (impaired relaxation).   2. Right ventricular systolic function is normal. The right ventricular  size is normal. Tricuspid regurgitation signal is inadequate for assessing  PA pressure.   3. The mitral valve is normal in structure. No evidence of mitral valve  regurgitation. No evidence of mitral stenosis.   4. The aortic valve is normal in structure. Aortic valve regurgitation is  not visualized. No aortic stenosis is present.   5. The inferior vena cava is normal in size with greater than 50%  respiratory variability, suggesting right atrial pressure of 3 mmHg.    Review of Systems: Review of Systems  Respiratory:  Positive for shortness of breath.   Cardiovascular:  Positive for  leg swelling.  All other systems reviewed and are negative.   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  On long-term warfarin   Malignant neoplasm of skin    Osteoarthritis of both knees    Seizure disorder (HCC) 1982   He had a "drop attack clinical  diagnosed with seizure disorder.  Started on Tegretol.  No further episodes.   Past Surgical History:  Procedure Laterality Date   APPENDECTOMY     BIL hip replacement  11/29/2011   BOWEL RESECTION  11/28/1965   INGUINAL HERNIA REPAIR Bilateral    Social History:  reports that he has quit smoking. His smoking use included cigarettes. He started smoking about 58 years ago. He has a 58.6 pack-year smoking history. He does not have any smokeless tobacco history on file. He reports current alcohol use. He reports that he does not use drugs.  Allergies  Allergen Reactions   Wound Dressing Adhesive Rash    Rash with blisters; band-aids espescially. Per [facility] Clinical Summary   Latex Rash    Per [facility] Clinical Summary    Family History  Problem Relation Age of Onset   Heart disease Father    Lung cancer Father     Prior to Admission medications   Medication Sig Start Date End Date Taking? Authorizing Provider  albuterol (VENTOLIN HFA) 108 (90 Base) MCG/ACT inhaler Inhale 2 puffs into the lungs every 4 (four) hours as needed. 12/01/22   [provider]  carbamazepine (TEGRETOL) 200 MG tablet Take 1 tablet by mouth 3 (three) times daily. 09/28/22   [provider]  cyanocobalamin (VITAMIN B12) 1000 MCG tablet Take 1,000 mcg by mouth daily. PRN Patient not taking: Reported on 06/17/2023 12/28/22 12/28/23  [provider]  DULoxetine (CYMBALTA) 30 MG capsule Take 30 mg by mouth daily.    [provider]  feeding supplement (ENSURE ENLIVE / ENSURE PLUS) LIQD Take 237 mLs by mouth 3 (three) times daily between meals. 06/23/23   Marcelino Duster, MD  ipratropium-albuterol (DUONEB) 0.5-2.5 (3) MG/3ML SOLN Take 3 mLs by nebulization every 6 (six) hours as needed. 06/23/23   Marcelino Duster, MD  megestrol (MEGACE) 400 MG/10ML suspension Take 10 mLs (400 mg total) by mouth 2 (two) times daily. 06/23/23   Marcelino Duster, MD  Multiple Vitamin (MULTIVITAMIN  WITH MINERALS) TABS tablet Take 1 tablet by mouth daily. 06/24/23   Marcelino Duster, MD  TRELEGY ELLIPTA 100-62.5-25 MCG/ACT AEPB Inhale 1 puff into the lungs daily. 01/03/23   [provider]  warfarin (COUMADIN) 5 MG tablet Take 1 tablet (5 mg total) by mouth daily at 4 PM. 06/23/23   Marcelino Duster, MD     Vitals:   06/28/23 1930 06/28/23 2000 06/28/23 2030 06/28/23 2100  BP: (!) 140/75 120/71 (!) 151/80 (!) 144/90  Pulse: (!) 103 (!) 110 (!) 107 (!) 107  Resp: (!) 27 (!) 26 (!) 24 (!) 25  Temp:      TempSrc:      SpO2: 97% 93% 98% 99%  Weight:      Height:  Physical Exam Vitals and nursing note reviewed.  Constitutional:      General: He is not in acute distress. HENT:     Head: Normocephalic and atraumatic.     Right Ear: Hearing normal.     Left Ear: Hearing normal.     Nose: Nose normal. No nasal deformity.     Mouth/Throat:     Lips: Pink.     Tongue: No lesions.     Pharynx: Oropharynx is clear.  Eyes:     General: Lids are normal.     Extraocular Movements: Extraocular movements intact.  Cardiovascular:     Rate and Rhythm: Normal rate and regular rhythm.     Heart sounds: Normal heart sounds.  Pulmonary:     Effort: Pulmonary effort is normal.     Breath sounds: Normal breath sounds.  Abdominal:     General: Bowel sounds are normal. There is no distension.     Palpations: Abdomen is soft. There is no mass.     Tenderness: There is no abdominal tenderness.  Musculoskeletal:     Right lower leg: Edema present.     Left lower leg: Edema present.  Skin:    General: Skin is warm.  Neurological:     General: No focal deficit present.     Mental Status: He is alert and oriented to person, place, and time.     Cranial Nerves: Cranial nerves 2-12 are intact.  Psychiatric:        Attention and Perception: Attention normal.        Mood and Affect: Mood normal.        Speech: Speech normal.        Behavior: Behavior normal. Behavior is  cooperative.      Labs on Admission: I have personally reviewed following labs and imaging studies  CBC: Recent Labs  Lab 06/22/23 0613 06/22/23 1650 06/23/23 0348 06/28/23 1806  WBC 16.6* 14.5* 13.9* 18.6*  NEUTROABS  --   --   --  14.8*  HGB 10.7* 10.7* 10.8* 12.0*  HCT 32.2* 31.7* 31.9* 37.3*  MCV 94.4 93.0 92.7 94.7  PLT 299 294 314 506*   Basic Metabolic Panel: Recent Labs  Lab 06/22/23 0613 06/23/23 0348 06/28/23 1806  NA 135 137 130*  K 3.2* 3.0* 3.9  CL 97* 99 96*  CO2 28 30 28   GLUCOSE 94 106* 187*  BUN 9 12 16   CREATININE 0.43* 0.49* 0.54*  CALCIUM 7.5* 7.7* 7.7*   GFR: Estimated Creatinine Clearance: 64.4 mL/min (A) (by C-G formula based on SCr of 0.54 mg/dL (L)). Liver Function Tests: Recent Labs  Lab 06/28/23 1806  AST 20  ALT 17  ALKPHOS 125  BILITOT 0.4  PROT 7.5  ALBUMIN 2.3*   No results for input(s): "LIPASE", "AMYLASE" in the last 168 hours. No results for input(s): "AMMONIA" in the last 168 hours. Coagulation Profile: Recent Labs  Lab 06/22/23 0613 06/23/23 0348 06/28/23 1806  INR 2.6* 1.7* 1.9*   Cardiac Enzymes: No results for input(s): "CKTOTAL", "CKMB", "CKMBINDEX", "TROPONINI" in the last 168 hours. BNP (last 3 results) No results for input(s): "PROBNP" in the last 8760 hours. HbA1C: No results for input(s): "HGBA1C" in the last 72 hours. CBG: No results for input(s): "GLUCAP" in the last 168 hours. Lipid Profile: No results for input(s): "CHOL", "HDL", "LDLCALC", "TRIG", "CHOLHDL", "LDLDIRECT" in the last 72 hours. Thyroid Function Tests: No results for input(s): "TSH", "T4TOTAL", "FREET4", "T3FREE", "THYROIDAB" in the last 72 hours. Anemia  Panel: No results for input(s): "VITAMINB12", "FOLATE", "FERRITIN", "TIBC", "IRON", "RETICCTPCT" in the last 72 hours. Urine analysis: Urinalysis No results found for: "COLORURINE", "APPEARANCEUR", "LABSPEC", "PHURINE", "GLUCOSEU", "HGBUR", "BILIRUBINUR", "KETONESUR", "PROTEINUR",  "UROBILINOGEN", "NITRITE", "LEUKOCYTESUR"  Unresulted Labs (From admission, onward)     Start     Ordered   06/28/23 2016  Resp Panel by RT-PCR (Flu A&B, Covid) Anterior Nasal Swab  Once,   URGENT        06/28/23 2015   06/28/23 2014  Procalcitonin  Add-on,   AD       References:    Procalcitonin Lower Respiratory Tract Infection AND Sepsis Procalcitonin Algorithm   06/28/23 2013   06/28/23 1816  Lactic acid, plasma  Now then every 2 hours,   STAT      06/28/23 1815   06/28/23 1816  Culture, blood (Routine x 2)  BLOOD CULTURE X 2,   STAT      06/28/23 1815   06/28/23 1816  Urinalysis, w/ Reflex to Culture (Infection Suspected) -Urine, Clean Catch  Once,   URGENT       Question:  Specimen Source  Answer:  Urine, Clean Catch   06/28/23 1815             Radiological Exams on Admission: DG Chest 1 View  Result Date: 06/28/2023 CLINICAL DATA:  History of pneumonia, sepsis EXAM: CHEST  1 VIEW COMPARISON:  06/20/2023 FINDINGS: 2 frontal views of the chest demonstrate a stable cardiac silhouette. There is persistent dense consolidation within the left upper lobe, with superior retraction of the left hilum consistent with volume loss. Airspace disease in the left perihilar region has slightly improved. Right chest is clear. Stable hyperinflation and emphysema. No effusion or pneumothorax. No acute bony abnormalities. IMPRESSION: 1. Persistent dense consolidation within the left upper lobe, compatible with pneumonia. The patchy areas of airspace disease in the left perihilar region seen on prior chest x-ray demonstrate mild improvement. 2. Stable emphysema. Electronically Signed   By: Sharlet Salina M.D.   On: 06/28/2023 19:28     Data Reviewed: Relevant notes from primary care and specialist visits, past discharge summaries as available in EHR, including Care Everywhere. Prior diagnostic testing as pertinent to current admission diagnoses Updated medications and problem lists for  reconciliation ED course, including vitals, labs, imaging, treatment and response to treatment Triage notes, nursing and pharmacy notes and ED provider's notes Notable results as noted in HPI Assessment and Plan: >>SOB/ Respiratory distress/ respiratory failure/left upper lobe pneumonia Admit to stepdown. Will obtain a stat CT angio chest for concerns of saddle PE, lung abscess, atypical pneumonia, lung cancer. Although patient has been on Coumadin for anticoagulation patient's risk of pulmonary embolism is high. Continue with BiPAP, respiratory consult. Blood gas in the a.m. Patient started on IV antibiotics in the emergency room initially patient received Rocephin and vancomycin which was eventually changed to Vanco cefepime and azithromycin. Daughter at bedside discussed care plan with her and the patient. Will get pulmonary consult in AM.  >> History of pulmonary embolism: On Coumadin. Suspect Coumadin failure either due to treatment therapy failure or underlying malignancy.  >>H/O seizures: Daughter states that he has been on carbamazepine for logn time without any neurology followup.  ? If he needs it or if he has any seizures.  >> COPD: Currently patient is in respiratory failure. Will continue with albuterol and DuoNebs every 6 scheduled or per RT recommendations.  >> Underweight: Nutrition consult per a.m. team once patient's medical  issues are stable.   DVT prophylaxis:  Heparin.  Consults:  None.   Advance Care Planning:  Full Code.  Family Communication:  Daughter.  Disposition Plan:  Back to previous home environment  Severity of Illness: The appropriate patient status for this patient is INPATIENT. Inpatient status is judged to be reasonable and necessary in order to provide the required intensity of service to ensure the patient's safety. The patient's presenting symptoms, physical exam findings, and initial radiographic and laboratory data in the context  of their chronic comorbidities is felt to place them at high risk for further clinical deterioration. Furthermore, it is not anticipated that the patient will be medically stable for discharge from the hospital within 2 midnights of admission.   * I certify that at the point of admission it is my clinical judgment that the patient will require inpatient hospital care spanning beyond 2 midnights from the point of admission due to high intensity of service, high risk for further deterioration and high frequency of surveillance required.*  Author: Gertha Calkin, MD 06/28/2023 9:55 PM  For on call review [email or web address].

## 2023-06-28 NOTE — Consult Note (Signed)
CODE SEPSIS - PHARMACY COMMUNICATION  **Broad Spectrum Antibiotics should be administered within 1 hour of Sepsis diagnosis**  Time Code Sepsis Called/Page Received: 1937  Antibiotics Ordered: vancomycin, cefepime  Time of 1st antibiotic administration: cefepime@1958   Additional action taken by pharmacy: n/a  If necessary, Name of Provider/Nurse Contacted: n/a    Bettey Costa ,PharmD Clinical Pharmacist  06/28/2023  8:16 PM

## 2023-06-28 NOTE — Consult Note (Addendum)
NAME:  Jorge Rosales, MRN:  [id number], DOB:  [birth year], LOS: 0 ADMISSION DATE:  06/28/2023, CONSULTATION DATE:  06/28/23 REFERRING MD:  Dr. Allena Katz, CHIEF COMPLAINT:  Shortness of breath  History of Present Illness:  77 yo M presenting to [facility] ED from [facility] via EMS for evaluation of dyspnea.  History provided per chart review and bedside patient report. Patient was recently admitted 06/17/23-06/23/23 for treatment of a LUL pneumonia and was discharged to [facility] for rehab. Patient admits to continued symptoms post discharge including progressively worsening dyspnea and a persistent productive cough with brown sputum. He denies chest pain, palpitations, fever/chills, nausea/vomiting/ abdominal pain/ diarrhea, signs of bleeding, seizures or LOC. He does report new BLE edema over the past 2 days. He recently had a supra therapeutic INR of 12, but the patient reported that his Coumadin was never stopped completely just reduced. He has been on 3-4 L [place] since discharging to rehab. EMS reported initial SpO2 at 79% on 4 L [place] they administered 2 g Mg, 1 duo neb and 125 mg of solumedrol.  ED course: Upon arrival patient alert and oriented, with tachypnea and hypertension. He was transitioned to BIPAP support. Labs significant for hypoalbuminemia, leukocytosis without lactic acidosis and a mildly elevated BNP. Imaging concerning for lung abscess continued pneumonia and positive of acute submassive PE. Vascular consulted for recommended heparin drip. TRH consulted for admission. Medications given: Duo neb, solu medrol, azithromycin/ cefepime/ vancomycin, heparin drip started & LR infusion Initial Vitals: 97.7, 30, 98, 172/81 & 100% on BIPAP support Significant labs: (Labs/ Imaging personally reviewed) I, Cheryll Cockayne Rust-Chester, AGACNP-BC, personally viewed and interpreted this ECG. EKG Interpretation: Date:  06/28/23, EKG Time: 18:02, Rate: 99, Rhythm: NSR, QRS Axis: borderline LAD, Intervals: normal, ST/T Wave  abnormalities: none, Narrative Interpretation: NSR Chemistry: Na+: 130, K+: 3.9, BUN/Cr.: 16/ 0.54, Serum CO2/ AG: 28/ 6, albumin: 2.3 Hematology: WBC: 18.6, Hgb: 12.0,  Troponin: 14, BNP: 194.1, Lactic/ PCT: 1.6 > 1.4/ 0.15, COVID-19 & Influenza A/B: negative  VBG: 7.31/ 50/ 51/ 30.3 CXR 06/28/23: Persistent dense consolidation within the left upper lobe, compatible with pneumonia. The patchy areas of airspace disease in the left perihilar region seen on prior chest x-ray demonstrate mild improvement. Stable emphysema CT angio chest PE 06/28/23: Acute pulmonary emboli in the distal right main pulmonary artery extending into segmental and subsegmental branches of the right upper lobe. Additional segmental and subsegmental pulmonary emboli in the right lower lobe and right middle lobe. Positive for acute PE with CT evidence of right heart strain (RV/LV Ratio = 1.3) consistent with at least submassive (intermediate risk) PE. The presence of right heart strain has been associated with an increased risk of morbidity and mortality. New air-fluid level in the left apex worrisome for abscess. Left upper lobe infiltrate otherwise appears unchanged. Lingular airspace disease has decreased, but there is some persistent ground-glass opacity in this region. Spiculated density in the left lower lobe is unchanged. Stable ground-glass opacity in the right upper lobe.   PCCM consulted for assistance in management and monitoring due to acute on chronic hypoxic respiratory failure secondary to acute submassive PE and pneumonia with possible abscess in the setting of COPD.  Pertinent  Medical History  Seizure disorder- drop attacks COPD (previous 2 L [place] PRN > recently 3-4 L [place] continuously) Cataracts Former Tobacco [place] (quit 2015) Bilateral multiple subsegmental PE on warfarin (2022) Pulmonary HTN Depression  Significant Hospital Events: Including procedures, antibiotic start and stop dates in addition to other  pertinent events   06/28/23: Admit to SDU with PCCM consult due to acute on chronic hypoxic respiratory failure secondary to acute submassive PE and pneumonia with possible abscess in the setting of COPD on BIPAP support.  Interim History / Subjective:  Patient on 8 L HFNC while taking PO medications, Oxygenation stable with mild dyspnea but able to speak in complete sentences. He reports feeling more comfortable on BIPAP. Plan of care discussed, all questions and concerns answered at this time.  Objective   Blood pressure (!) 106/55, pulse (!) 102, temperature (!) 97.3 F (36.3 C), temperature source Axillary, resp. rate (!) 21, height 5\' 10"  (1.778 m), weight 55.9 kg, SpO2 99%.    FiO2 (%):  [35 %-40 %] 35 % Pressure Support:  [5 cmH20] 5 cmH20   Intake/Output Summary (Last 24 hours) at 06/28/2023 2357 Last data filed at 06/28/2023 2255 Gross per 24 hour  Intake 250.95 ml  Output --  Net 250.95 ml   Filed Weights   06/28/23 1803 06/28/23 2330  Weight: 58 kg 55.9 kg    Examination: General: Adult male, acutely ill, lying in bed, NAD HEENT: MM pink/moist, anicteric, atraumatic, neck supple Neuro: A&O x 4, able to follow commands, PERRL +3, MAE CV: s1s2 RRR, NSR on monitor, no r/m/g Pulm: Regular, mildly labored on 8 L HFNC- no labored on BIPAP @ 35%, breath sounds coarse crackles-BUL & diminished-BLL GI: soft, rounded, non tender, bs x 4 Skin: no rashes/lesions noted Extremities: warm/dry, pulses + 2 R/P, +1 edema noted BLE  Resolved Hospital Problem list     Assessment & Plan:  Acute on chronic Hypoxic Respiratory Failure secondary to acute submassive PE & Pneumonia with possible lung abscess in the setting of COPD PMHx: COPD on chronic O2, former tobacco use Discussed case with Intensivist, not a candidate for emergent CT placement.  - Continue BIPAP overnight, wean FiO2 as tolerated - Supplemental O2 to maintain SpO2 > 90% - Intermittent chest x-ray & ABG PRN - Ensure  adequate pulmonary hygiene  - F/u cultures, trend PCT - Continue Pna with suspected abscess coverage: zosyn & vancomycin - budesonide nebs BID, Duo nebs TID, bronchodilators PRN  Acute Submassive Pulmonary embolism  PMHx: Bilateral PE on warfarin (2022), pulmonary HTN - Systemic Heparin drip - Follow up BLE dopplers to assess for DVT - Echocardiogram ordered  - Vascular consulted, appreciate input  Seizure Disorder - continue outpatient Tegretol, consider outpatient neurology follow up to discuss medication options given need for long term anticoagulation  Hypoalbuminemia - nutrition consult once off BIPAP and tolerating a diet  Best Practice (right click and "Reselect all SmartList Selections" daily)  Diet/type: NPO DVT prophylaxis: systemic heparin GI prophylaxis: PPI Lines: N/A Foley:  N/A Code Status:  full code Last date of multidisciplinary goals of care discussion [06/28/23]  Labs   CBC: Recent Labs  Lab 06/22/23 0613 06/22/23 1650 06/23/23 0348 06/28/23 1806  WBC 16.6* 14.5* 13.9* 18.6*  NEUTROABS  --   --   --  14.8*  HGB 10.7* 10.7* 10.8* 12.0*  HCT 32.2* 31.7* 31.9* 37.3*  MCV 94.4 93.0 92.7 94.7  PLT 299 294 314 506*    Basic Metabolic Panel: Recent Labs  Lab 06/22/23 0613 06/23/23 0348 06/28/23 1806  NA 135 137 130*  K 3.2* 3.0* 3.9  CL 97* 99 96*  CO2 28 30 28   GLUCOSE 94 106* 187*  BUN 9 12 16   CREATININE 0.43* 0.49* 0.54*  CALCIUM 7.5* 7.7* 7.7*   GFR:  Estimated Creatinine Clearance: 62.1 mL/min (A) (by C-G formula based on SCr of 0.54 mg/dL (L)). Recent Labs  Lab 06/22/23 0613 06/22/23 1650 06/23/23 0348 06/28/23 1806 06/28/23 2231  PROCALCITON  --   --   --  0.15  --   WBC 16.6* 14.5* 13.9* 18.6*  --   LATICACIDVEN  --   --   --  1.6 1.4    Liver Function Tests: Recent Labs  Lab 06/28/23 1806  AST 20  ALT 17  ALKPHOS 125  BILITOT 0.4  PROT 7.5  ALBUMIN 2.3*   No results for input(s): "LIPASE", "AMYLASE" in the last  168 hours. No results for input(s): "AMMONIA" in the last 168 hours.  ABG    Component Value Date/Time   HCO3 30.3 (H) 06/28/2023 1816   O2SAT 80.6 06/28/2023 1816     Coagulation Profile: Recent Labs  Lab 06/22/23 0613 06/23/23 0348 06/28/23 1806  INR 2.6* 1.7* 1.9*    Cardiac Enzymes: No results for input(s): "CKTOTAL", "CKMB", "CKMBINDEX", "TROPONINI" in the last 168 hours.  HbA1C: No results found for: "HGBA1C"  CBG: Recent Labs  Lab 06/28/23 2326  GLUCAP 143*    Review of Systems: positives in BOLD  Gen: Denies fever, chills, weight change, fatigue, night sweats HEENT: Denies blurred vision, double vision, hearing loss, tinnitus, sinus congestion, rhinorrhea, sore throat, neck stiffness, dysphagia PULM: Denies shortness of breath, cough, sputum production, hemoptysis, wheezing CV: Denies chest pain, edema, orthopnea, paroxysmal nocturnal dyspnea, palpitations GI: Denies abdominal pain, nausea, vomiting, diarrhea, hematochezia, melena, constipation, change in bowel habits GU: Denies dysuria, hematuria, polyuria, oliguria, urethral discharge Endocrine: Denies hot or cold intolerance, polyuria, polyphagia or appetite change Derm: Denies rash, dry skin, scaling or peeling skin change Heme: Denies easy bruising, bleeding, bleeding gums Neuro: Denies headache, numbness, weakness, slurred speech, loss of memory or consciousness  Past Medical History:  He,  has a past medical history of Cataracts, both eyes, Chronic bronchitis (HCC), COPD (chronic obstructive pulmonary disease) with emphysema (HCC), Depression, Former heavy tobacco smoker, History of colon surgery (11/28/1965), History of pulmonary embolism (05/15/2021), Malignant neoplasm of skin, Osteoarthritis of both knees, and Seizure disorder (HCC) (1982).   Surgical History:   Past Surgical History:  Procedure Laterality Date   APPENDECTOMY     BIL hip replacement  11/29/2011   BOWEL RESECTION  11/28/1965    INGUINAL HERNIA REPAIR Bilateral      Social History:   reports that he has quit smoking. His smoking use included cigarettes. He started smoking about 58 years ago. He has a 58.6 pack-year smoking history. He does not have any smokeless tobacco history on file. He reports current alcohol use. He reports that he does not use drugs.   Family History:  His family history includes Heart disease in his father; Lung cancer in his father.   Allergies Allergies  Allergen Reactions   Wound Dressing Adhesive Rash    Rash with blisters; band-aids espescially. Per [facility] Clinical Summary   Latex Rash    Per [facility] Clinical Summary     Home Medications  Prior to Admission medications   Medication Sig Start Date End Date Taking? Authorizing Provider  albuterol (VENTOLIN HFA) 108 (90 Base) MCG/ACT inhaler Inhale 2 puffs into the lungs every 4 (four) hours as needed. 12/01/22   [provider]  carbamazepine (TEGRETOL) 200 MG tablet Take 1 tablet by mouth 3 (three) times daily. 09/28/22   [provider]  cyanocobalamin (VITAMIN B12) 1000 MCG tablet Take  1,000 mcg by mouth daily. PRN Patient not taking: Reported on 06/17/2023 12/28/22 12/28/23  [provider]  DULoxetine (CYMBALTA) 30 MG capsule Take 30 mg by mouth daily.    [provider]  feeding supplement (ENSURE ENLIVE / ENSURE PLUS) LIQD Take 237 mLs by mouth 3 (three) times daily between meals. 06/23/23   Marcelino Duster, MD  ipratropium-albuterol (DUONEB) 0.5-2.5 (3) MG/3ML SOLN Take 3 mLs by nebulization every 6 (six) hours as needed. 06/23/23   Marcelino Duster, MD  megestrol (MEGACE) 400 MG/10ML suspension Take 10 mLs (400 mg total) by mouth 2 (two) times daily. 06/23/23   Marcelino Duster, MD  Multiple Vitamin (MULTIVITAMIN WITH MINERALS) TABS tablet Take 1 tablet by mouth daily. 06/24/23   Marcelino Duster, MD  TRELEGY ELLIPTA 100-62.5-25 MCG/ACT AEPB Inhale 1 puff into the lungs daily. 01/03/23    [provider]  warfarin (COUMADIN) 5 MG tablet Take 1 tablet (5 mg total) by mouth daily at 4 PM. 06/23/23   Marcelino Duster, MD     Critical care time: 62 minutes       Betsey Holiday, AGACNP-BC Acute Care Nurse Practitioner [facility]   [phone] / [phone] Please see Amion for details.

## 2023-06-28 NOTE — Consult Note (Incomplete)
NAME:  Jorge Rosales, MRN:  [id number], DOB:  29-Mar-1946, LOS: 0 ADMISSION DATE:  06/28/2023, CONSULTATION DATE:  06/28/23 REFERRING MD:  Dr. Allena Katz, CHIEF COMPLAINT:  Shortness of breath  History of Present Illness:  77 yo M presenting to [facility] ED from [facility] via EMS for evaluation of dyspnea.  History provided per chart review and bedside patient report.  ED course: ***. Medications given: *** Initial Vitals: *** Significant labs: (Labs/ Imaging personally reviewed) I, Cheryll Cockayne Rust-Chester, AGACNP-BC, personally viewed and interpreted this ECG. EKG Interpretation: Date: ***, EKG Time: ***, Rate: ***, Rhythm: ***, QRS Axis:  *** Intervals: ***, ST/T Wave abnormalities: ***, Narrative Interpretation: *** Chemistry: Na+:***, K+: ***, BUN/Cr.: ***, Serum CO2/ AG: *** Hematology: WBC: ***, Hgb: ***,  Troponin: ***, BNP: ***, Lactic/ PCT: ***, COVID-19 & Influenza A/B: *** ABG: *** CXR ***: *** CT ***: ***  PCCM consulted for admission due to ***.  Pertinent  Medical History    Significant Hospital Events: Including procedures, antibiotic start and stop dates in addition to other pertinent events     Interim History / Subjective:  ***  Objective   Blood pressure (!) 106/55, pulse (!) 102, temperature (!) 97.3 F (36.3 C), temperature source Axillary, resp. rate (!) 21, height 5\' 10"  (1.778 m), weight 55.9 kg, SpO2 99%.    FiO2 (%):  [35 %-40 %] 35 % Pressure Support:  [5 cmH20] 5 cmH20   Intake/Output Summary (Last 24 hours) at 06/28/2023 2357 Last data filed at 06/28/2023 2255 Gross per 24 hour  Intake 250.95 ml  Output --  Net 250.95 ml   Filed Weights   06/28/23 1803 06/28/23 2330  Weight: 58 kg 55.9 kg    Examination: General: Adult ***, critically***acutely ill, lying in bed intubated & sedated requiring mechanical ventilation *** NAD HEENT: MM pink/moist, anicteric***, atraumatic, neck supple Neuro: A&O x *** commands, PERRL *** , MAE CV: s1s2 ***RRR, *** on  monitor, no r/m/g Pulm: Regular, non labored on *** , breath sounds ***-BUL & ***-BLL GI: soft, ***, non***tender, bs x 4 GU: foley in place *** with clear yellow urine Skin: *** no rashes/lesions noted Extremities: warm/dry, pulses + 2 R/P, *** edema noted  Resolved Hospital Problem list     Assessment & Plan:  ***  Best Practice (right click and "Reselect all SmartList Selections" daily)  Diet/type: NPO DVT prophylaxis: systemic heparin GI prophylaxis: PPI Lines: N/A Foley:  N/A Code Status:  full code Last date of multidisciplinary goals of care discussion [06/28/23]  Labs   CBC: Recent Labs  Lab 06/22/23 0613 06/22/23 1650 06/23/23 0348 06/28/23 1806  WBC 16.6* 14.5* 13.9* 18.6*  NEUTROABS  --   --   --  14.8*  HGB 10.7* 10.7* 10.8* 12.0*  HCT 32.2* 31.7* 31.9* 37.3*  MCV 94.4 93.0 92.7 94.7  PLT 299 294 314 506*    Basic Metabolic Panel: Recent Labs  Lab 06/22/23 0613 06/23/23 0348 06/28/23 1806  NA 135 137 130*  K 3.2* 3.0* 3.9  CL 97* 99 96*  CO2 28 30 28   GLUCOSE 94 106* 187*  BUN 9 12 16   CREATININE 0.43* 0.49* 0.54*  CALCIUM 7.5* 7.7* 7.7*   GFR: Estimated Creatinine Clearance: 62.1 mL/min (A) (by C-G formula based on SCr of 0.54 mg/dL (L)). Recent Labs  Lab 06/22/23 0613 06/22/23 1650 06/23/23 0348 06/28/23 1806 06/28/23 2231  PROCALCITON  --   --   --  0.15  --   WBC 16.6* 14.5* 13.9*  18.6*  --   LATICACIDVEN  --   --   --  1.6 1.4    Liver Function Tests: Recent Labs  Lab 06/28/23 1806  AST 20  ALT 17  ALKPHOS 125  BILITOT 0.4  PROT 7.5  ALBUMIN 2.3*   No results for input(s): "LIPASE", "AMYLASE" in the last 168 hours. No results for input(s): "AMMONIA" in the last 168 hours.  ABG    Component Value Date/Time   HCO3 30.3 (H) 06/28/2023 1816   O2SAT 80.6 06/28/2023 1816     Coagulation Profile: Recent Labs  Lab 06/22/23 0613 06/23/23 0348 06/28/23 1806  INR 2.6* 1.7* 1.9*    Cardiac Enzymes: No results for  input(s): "CKTOTAL", "CKMB", "CKMBINDEX", "TROPONINI" in the last 168 hours.  HbA1C: No results found for: "HGBA1C"  CBG: Recent Labs  Lab 06/28/23 2326  GLUCAP 143*    Review of Systems: positives in BOLD***  Gen: Denies fever, chills, weight change, fatigue, night sweats HEENT: Denies blurred vision, double vision, hearing loss, tinnitus, sinus congestion, rhinorrhea, sore throat, neck stiffness, dysphagia PULM: Denies shortness of breath, cough, sputum production, hemoptysis, wheezing CV: Denies chest pain, edema, orthopnea, paroxysmal nocturnal dyspnea, palpitations GI: Denies abdominal pain, nausea, vomiting, diarrhea, hematochezia, melena, constipation, change in bowel habits GU: Denies dysuria, hematuria, polyuria, oliguria, urethral discharge Endocrine: Denies hot or cold intolerance, polyuria, polyphagia or appetite change Derm: Denies rash, dry skin, scaling or peeling skin change Heme: Denies easy bruising, bleeding, bleeding gums Neuro: Denies headache, numbness, weakness, slurred speech, loss of memory or consciousness  Past Medical History:  He,  has a past medical history of Cataracts, both eyes, Chronic bronchitis (HCC), COPD (chronic obstructive pulmonary disease) with emphysema (HCC), Depression, Former heavy tobacco smoker, History of colon surgery (11/28/1965), History of pulmonary embolism (05/15/2021), Malignant neoplasm of skin, Osteoarthritis of both knees, and Seizure disorder (HCC) (1982).   Surgical History:   Past Surgical History:  Procedure Laterality Date  . APPENDECTOMY    . BIL hip replacement  11/29/2011  . BOWEL RESECTION  11/28/1965  . INGUINAL HERNIA REPAIR Bilateral      Social History:   reports that he has quit smoking. His smoking use included cigarettes. He started smoking about 58 years ago. He has a 58.6 pack-year smoking history. He does not have any smokeless tobacco history on file. He reports current alcohol use. He reports that he  does not use drugs.   Family History:  His family history includes Heart disease in his father; Lung cancer in his father.   Allergies Allergies  Allergen Reactions  . Wound Dressing Adhesive Rash    Rash with blisters; band-aids espescially. Per [facility] Clinical Summary  . Latex Rash    Per [facility] Clinical Summary     Home Medications  Prior to Admission medications   Medication Sig Start Date End Date Taking? Authorizing Provider  albuterol (VENTOLIN HFA) 108 (90 Base) MCG/ACT inhaler Inhale 2 puffs into the lungs every 4 (four) hours as needed. 12/01/22   [provider]  carbamazepine (TEGRETOL) 200 MG tablet Take 1 tablet by mouth 3 (three) times daily. 09/28/22   [provider]  cyanocobalamin (VITAMIN B12) 1000 MCG tablet Take 1,000 mcg by mouth daily. PRN Patient not taking: Reported on 06/17/2023 12/28/22 12/28/23  [provider]  DULoxetine (CYMBALTA) 30 MG capsule Take 30 mg by mouth daily.    [provider]  feeding supplement (ENSURE ENLIVE / ENSURE PLUS) LIQD Take 237 mLs by mouth  3 (three) times daily between meals. 06/23/23   Marcelino Duster, MD  ipratropium-albuterol (DUONEB) 0.5-2.5 (3) MG/3ML SOLN Take 3 mLs by nebulization every 6 (six) hours as needed. 06/23/23   Marcelino Duster, MD  megestrol (MEGACE) 400 MG/10ML suspension Take 10 mLs (400 mg total) by mouth 2 (two) times daily. 06/23/23   Marcelino Duster, MD  Multiple Vitamin (MULTIVITAMIN WITH MINERALS) TABS tablet Take 1 tablet by mouth daily. 06/24/23   Marcelino Duster, MD  TRELEGY ELLIPTA 100-62.5-25 MCG/ACT AEPB Inhale 1 puff into the lungs daily. 01/03/23   [provider]  warfarin (COUMADIN) 5 MG tablet Take 1 tablet (5 mg total) by mouth daily at 4 PM. 06/23/23   Marcelino Duster, MD     Critical care time: ***       Cheryll Cockayne Rust-Chester, AGACNP-BC Acute Care Nurse Practitioner [facility]   [phone] /  [phone] Please see Amion for details.

## 2023-06-28 NOTE — Progress Notes (Signed)
ANTICOAGULATION CONSULT NOTE - Initial Consult  Pharmacy Consult for Warfarin, Heparin  Indication: DVT  Allergies  Allergen Reactions   Wound Dressing Adhesive Rash    Rash with blisters; band-aids espescially. Per [facility] Clinical Summary   Latex Rash    Per [facility] Clinical Summary    Patient Measurements: Height: 5\' 10"  (177.8 cm) Weight: 58 kg (127 lb 13.9 oz) IBW/kg (Calculated) : 73 Heparin Dosing Weight: 58 kg   Vital Signs: Temp: 98.1 F (36.7 C) (07/31 2212) Temp Source: Oral (07/31 2212) BP: 143/81 (07/31 2200) Pulse Rate: 101 (07/31 2200)  Labs: Recent Labs    06/28/23 1806  HGB 12.0*  HCT 37.3*  PLT 506*  LABPROT 21.6*  INR 1.9*  CREATININE 0.54*  TROPONINIHS 14    Estimated Creatinine Clearance: 64.4 mL/min (A) (by C-G formula based on SCr of 0.54 mg/dL (L)).   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  Started on Tegretol.  No further episodes.    Medications:  (Not in a hospital admission)   Assessment: Pharmacy consulted to dose heparin and warfarin for DVT treatment in this 77 year old male admitted with sepsis, PNA.   Pt was on warfarin 5 mg PO daily PTA, unsure of last dose.  INR was subtherapeutic so will bridge with heparin for now.   7/31:  INR = 1.9  CrCl = 64.4 ml/min   Update:   MD cancelled warfarin consult, will continue pt on heparin for now   Goal of Therapy:  INR  2-3 HL :  0.3 -0.7  Monitor platelets by anticoagulation protocol: Yes   Plan:  No bolus per vascular surgery Start heparin infusion at 950  units/hr Check anti-Xa level in 8 hours and daily while on heparin Continue to monitor H&H and platelets  Will recheck INR on 08/01 with AM labs  8/1 @ 0024:  MD cancelled warfarin consult but continuing heparin   Tequita Marrs D 06/28/2023,10:52 PM

## 2023-06-28 NOTE — Progress Notes (Addendum)
Pharmacy Antibiotic Note  Jorge Rosales is a 77 y.o. male admitted on 06/28/2023 with sepsis, lung abscess.  Pharmacy has been consulted for Vancomycin, Cefepime dosing.  7/31 @ 2329:   Cefepime d/c'd ,  abx coverage expanded to Zosyn to cover for anaerobes.   Plan: Cefepime 2 gm IV X 1 given in ED on 7/31 @ 1958. Cefepime 2 gm IV Q8H ordered to start 8/01 @ 0400.   Vancomycin 1250 mg IV X 1 given in ED on 7/31 @ 2030. Vancomycin 1250 mg IV Q24H ordered to start on 8/1 @ 2030.  AUC = 517.1 Vanc trough = 10.8   Cefepime d/c'd.  Zosyn 3.375 gm IV Q8H EI ordered to start on 7/31 @ 2330.   Height: 5\' 10"  (177.8 cm) Weight: 58 kg (127 lb 13.9 oz) IBW/kg (Calculated) : 73  Temp (24hrs), Avg:97.9 F (36.6 C), Min:97.7 F (36.5 C), Max:98.1 F (36.7 C)  Recent Labs  Lab 06/22/23 0613 06/22/23 1650 06/23/23 0348 06/28/23 1806  WBC 16.6* 14.5* 13.9* 18.6*  CREATININE 0.43*  --  0.49* 0.54*  LATICACIDVEN  --   --   --  1.6    Estimated Creatinine Clearance: 64.4 mL/min (A) (by C-G formula based on SCr of 0.54 mg/dL (L)).    Allergies  Allergen Reactions   Wound Dressing Adhesive Rash    Rash with blisters; band-aids espescially. Per [facility] Clinical Summary   Latex Rash    Per [facility] Clinical Summary    Antimicrobials this admission:   >>    >>   Dose adjustments this admission:   Microbiology results:  BCx:   UCx:    Sputum:    MRSA PCR:   Thank you for allowing pharmacy to be a part of this patient's care.  Joanann Mies D 06/28/2023 11:01 PM

## 2023-06-28 NOTE — ED Provider Notes (Signed)
[facility] Provider Note    Event Date/Time   First MD Initiated Contact with Patient 06/28/23 1758     (approximate)   History   Shortness of Breath  EM caveat supplementation due to patient's dyspnea acute respiratory dysfunction  HPI  Jorge Rosales is a 77 y.o. male with a history of COPD recent pneumonia.  Reviewed previous discharge summary from July 26.  Patient also has a history of PE on Coumadin  Patient presents today for increasing shortness of breath.  Patient reports he never felt like he got better.  He is continue to have cough shortness of breath worsening over more so over the last few days at rehab  No chest pain no pain anywhere.  He reports he feels like he never got better and is continue to have a persistent wet cough.  His ankles are swollen but he reports that is chronic     Physical Exam   Triage Vital Signs: ED Triage Vitals  Encounter Vitals Group     BP 06/28/23 1802 (!) 172/81     Systolic BP Percentile --      Diastolic BP Percentile --      Pulse Rate 06/28/23 1802 100     Resp 06/28/23 1802 (!) 25     Temp 06/28/23 1802 97.7 F (36.5 C)     Temp Source 06/28/23 1802 Axillary     SpO2 06/28/23 1801 100 %     Weight 06/28/23 1803 127 lb 13.9 oz (58 kg)     Height 06/28/23 1803 5\' 10"  (1.778 m)     Head Circumference --      Peak Flow --      Pain Score 06/28/23 1803 0     Pain Loc --      Pain Education --      Exclude from Growth Chart --     Most recent vital signs: Vitals:   06/28/23 1802 06/28/23 1830  BP: (!) 172/81 (!) 155/78  Pulse: 100 (!) 105  Resp: (!) 25 (!) 26  Temp: 97.7 F (36.5 C)   SpO2: 98% 96%     General: Awake, sitting upright acutely dyspneic, speaking in short phrases, currently on 4 L nasal cannula to maintain saturations in the mid 90s. CV:  Good peripheral perfusion.  Normal tones.  Slight irregularity of pulse Resp:  Dyspnea accessory muscle use speaking 1-2 word phrases,  appears acutely dyspneic.  Notable Rales and rhonchi throughout both lung fields, more so in the lower fields.  Diminished lung sounds left upper Abd:  No distention.  Soft nontender nondistended Other:  Fully alert oriented.  Moderate bilateral lower extremity pitting edema   Patient reevaluated approximately 10 minutes after initiating BiPAP his work of breathing much more comfortable oxygenation good, appears to be much improved  ED Results / Procedures / Treatments   Labs (all labs ordered are listed, but only abnormal results are displayed) Labs Reviewed  BLOOD GAS, VENOUS - Abnormal; Notable for the following components:      Result Value   pO2, Ven 51 (*)    Bicarbonate 30.3 (*)    Acid-Base Excess 4.2 (*)    All other components within normal limits  COMPREHENSIVE METABOLIC PANEL - Abnormal; Notable for the following components:   Sodium 130 (*)    Chloride 96 (*)    Glucose, Bld 187 (*)    Creatinine, Ser 0.54 (*)    Calcium 7.7 (*)  Albumin 2.3 (*)    All other components within normal limits  CBC WITH DIFFERENTIAL/PLATELET - Abnormal; Notable for the following components:   WBC 18.6 (*)    RBC 3.94 (*)    Hemoglobin 12.0 (*)    HCT 37.3 (*)    Platelets 506 (*)    Neutro Abs 14.8 (*)    Monocytes Absolute 1.1 (*)    Abs Immature Granulocytes 0.54 (*)    All other components within normal limits  PROTIME-INR - Abnormal; Notable for the following components:   Prothrombin Time 21.6 (*)    INR 1.9 (*)    All other components within normal limits  CULTURE, BLOOD (ROUTINE X 2)  CULTURE, BLOOD (ROUTINE X 2)  LACTIC ACID, PLASMA  LACTIC ACID, PLASMA  URINALYSIS, W/ REFLEX TO CULTURE (INFECTION SUSPECTED)  BRAIN NATRIURETIC PEPTIDE   Labs interpreted as normal pH.  Mild hyponatremia, normal renal function.  Leukocyte no ptosis significant 18.6.  Mild anemia.  INR 1.9 therapeutic  EKG  Interpreted by me at 1805 heart rate 100 QRS 80 QTc 440 Sinus tachycardia,  no evidence of acute ischemia.  Slight baseline artifact is present   RADIOLOGY  Chest x-ray interpreted by me as notable left upper lung infiltrate  DG Chest 1 View  Result Date: 06/28/2023 CLINICAL DATA:  History of pneumonia, sepsis EXAM: CHEST  1 VIEW COMPARISON:  06/20/2023 FINDINGS: 2 frontal views of the chest demonstrate a stable cardiac silhouette. There is persistent dense consolidation within the left upper lobe, with superior retraction of the left hilum consistent with volume loss. Airspace disease in the left perihilar region has slightly improved. Right chest is clear. Stable hyperinflation and emphysema. No effusion or pneumothorax. No acute bony abnormalities. IMPRESSION: 1. Persistent dense consolidation within the left upper lobe, compatible with pneumonia. The patchy areas of airspace disease in the left perihilar region seen on prior chest x-ray demonstrate mild improvement. 2. Stable emphysema. Electronically Signed   By: Sharlet Salina M.D.   On: 06/28/2023 19:28      PROCEDURES:  Critical Care performed: Yes, see critical care procedure note(s)  CRITICAL CARE Performed by: Sharyn Creamer   Total critical care time: 30 minutes  Critical care time was exclusive of separately billable procedures and treating other patients.  Critical care was necessary to treat or prevent imminent or life-threatening deterioration.  Critical care was time spent personally by me on the following activities: development of treatment plan with patient and/or surrogate as well as nursing, discussions with consultants, evaluation of patient's response to treatment, examination of patient, obtaining history from patient or surrogate, ordering and performing treatments and interventions, ordering and review of laboratory studies, ordering and review of radiographic studies, pulse oximetry and re-evaluation of patient's condition.   Procedures   MEDICATIONS ORDERED IN ED: Medications   lactated ringers infusion (has no administration in time range)  vancomycin (VANCOCIN) IVPB 1000 mg/200 mL premix (has no administration in time range)  ceFEPIme (MAXIPIME) 2 g in sodium chloride 0.9 % 100 mL IVPB (has no administration in time range)  ipratropium-albuterol (DUONEB) 0.5-2.5 (3) MG/3ML nebulizer solution 3 mL ( Nebulization Not Given 06/28/23 1825)     IMPRESSION / MDM / ASSESSMENT AND PLAN / ED COURSE  I reviewed the triage vital signs and the nursing notes.                              Differential diagnosis includes,  but is not limited to, pneumonia, COPD exacerbation no notable mild wheezing, volume overload given rales on examination, unlikely represent ACS without associated chest pain, patient actively anticoagulated arguing against PE, recent history of pneumonia sepsis and admission with similar symptomatology is also notable.  He does have some findings including lower extremity edema suggestive of volume overload as well as rales does not clearly carry an obvious history of acute congestive heart failure and his most recent echo does not appear to show severe dysfunction  Patient's presentation is most consistent with acute presentation with potential threat to life or bodily function.   The patient is on the cardiac monitor to evaluate for evidence of arrhythmia and/or significant heart rate changes.     ----------------------------------------- 7:42 PM on 06/28/2023 ----------------------------------------- Patient has elements of exam suggestive of volume overload but given concerns for associated sepsis I am currently awaiting further workup.  His chest x-ray does not show overt pulmonary edema though he does appear somewhat volume overloaded.  At this juncture I suspect his dyspnea is primarily associated with healthcare associated or persistent pneumonia.  Will broaden his antibiotic coverage.  He is currently tolerating BiPAP very well.  Will give  additional DuoNeb a scant amount of wheezing present as well as as steroid.  Awaiting BNP.  He has a history of mild diastolic dysfunction but his medical record does not carry any particular history of congestive heart failure or volume overload that I have found  ----------------------------------------- 9:38 PM on 06/28/2023 ----------------------------------------- Consulted with and patient accepted to hospital service by Dr. Allena Katz.  She advises a recommendation to add azithromycin which has been ordered.  Discussed concerns for potential volume overload but current examination concerns also with significant leukocytosis for possible sepsis.  At this time Dr. Allena Katz will further evaluate and continue care, at this time ED will withhold diuresis  FINAL CLINICAL IMPRESSION(S) / ED DIAGNOSES   Final diagnoses:  HCAP (healthcare-associated pneumonia)  Sepsis, due to unspecified organism, unspecified whether acute organ dysfunction present [facility])     Rx / DC Orders   ED Discharge Orders     None        Note:  This document was prepared using Dragon voice recognition software and may include unintentional dictation errors.   Sharyn Creamer, MD 06/28/23 2139

## 2023-06-28 NOTE — Progress Notes (Signed)
Pt transported on Bipap to CT and back without incident

## 2023-06-28 NOTE — ED Triage Notes (Signed)
Pt arrives via [facility] from [facility] w/ c/o SOB today, on fire arrival pt was 79% on 4L [place]. Pt wears 2L  PRN. Pt placed on 12L NRB with improvement to 98%. Pt was recently dcd to [facility] after being treated for pna and sepsis.   Given 2g mag, 1 duoneb, 1 albuterol, 125 solumedrol  144 cbg 97.7 100s sinus tach 171/101 AO4  RT to bedside

## 2023-06-28 NOTE — Progress Notes (Signed)
Pt transported from ED to ICU on Bipap without incident

## 2023-06-28 NOTE — ED Notes (Signed)
Patient transported to CT 

## 2023-06-28 NOTE — Sepsis Progress Note (Signed)
Elink monitoring for the code sepsis protocol.  

## 2023-06-28 NOTE — Progress Notes (Signed)
PHARMACY -  BRIEF ANTIBIOTIC NOTE   Pharmacy has received consult(s) for sepsis from an ED provider.  The patient's profile has been reviewed for ht/wt/allergies/indication/available labs.    One time order(s) placed for cefepime and vancomycin  Further antibiotics/pharmacy consults should be ordered by admitting physician if indicated.                       Thank you, Rockwell Alexandria 06/28/2023  7:41 PM

## 2023-06-29 ENCOUNTER — Inpatient Hospital Stay (HOSPITAL_COMMUNITY)
Admit: 2023-06-29 | Discharge: 2023-06-29 | Disposition: A | Discharge status: Home / Self Care | Payer: Medicare Other | Attending: Pulmonary Disease | Admitting: Pulmonary Disease

## 2023-06-29 ENCOUNTER — Inpatient Hospital Stay: Payer: Medicare Other

## 2023-06-29 DIAGNOSIS — J9601 Acute respiratory failure with hypoxia: Secondary | ICD-10-CM | POA: Diagnosis not present

## 2023-06-29 DIAGNOSIS — Z87891 Personal history of nicotine dependence: Secondary | ICD-10-CM | POA: Diagnosis not present

## 2023-06-29 DIAGNOSIS — I2609 Other pulmonary embolism with acute cor pulmonale: Secondary | ICD-10-CM

## 2023-06-29 DIAGNOSIS — J851 Abscess of lung with pneumonia: Secondary | ICD-10-CM | POA: Diagnosis not present

## 2023-06-29 DIAGNOSIS — G40909 Epilepsy, unspecified, not intractable, without status epilepticus: Secondary | ICD-10-CM

## 2023-06-29 DIAGNOSIS — Z7189 Other specified counseling: Secondary | ICD-10-CM | POA: Diagnosis not present

## 2023-06-29 DIAGNOSIS — Z86711 Personal history of pulmonary embolism: Secondary | ICD-10-CM | POA: Diagnosis not present

## 2023-06-29 DIAGNOSIS — J189 Pneumonia, unspecified organism: Secondary | ICD-10-CM | POA: Diagnosis not present

## 2023-06-29 DIAGNOSIS — I825Z1 Chronic embolism and thrombosis of unspecified deep veins of right distal lower extremity: Secondary | ICD-10-CM | POA: Diagnosis not present

## 2023-06-29 DIAGNOSIS — I2699 Other pulmonary embolism without acute cor pulmonale: Secondary | ICD-10-CM | POA: Diagnosis not present

## 2023-06-29 DIAGNOSIS — J9621 Acute and chronic respiratory failure with hypoxia: Secondary | ICD-10-CM | POA: Diagnosis not present

## 2023-06-29 LAB — BASIC METABOLIC PANEL
Anion gap: 10 (ref 5–15)
BUN: 12 mg/dL (ref 8–23)
CO2: 25 mmol/L (ref 22–32)
Calcium: 7.8 mg/dL — ABNORMAL LOW (ref 8.9–10.3)
Chloride: 98 mmol/L (ref 98–111)
Creatinine, Ser: 0.5 mg/dL — ABNORMAL LOW (ref 0.61–1.24)
GFR, Estimated: >60 mL/min (ref >60)
Glucose, Bld: 127 mg/dL — ABNORMAL HIGH (ref 70–99)
Potassium: 4.4 mmol/L (ref 3.5–5.1)
Sodium: 133 mmol/L — ABNORMAL LOW (ref 135–145)

## 2023-06-29 LAB — ECHOCARDIOGRAM COMPLETE
Height: 70 in
S' Lateral: 3 cm
Weight: 1971.79 oz

## 2023-06-29 LAB — EXPECTORATED SPUTUM ASSESSMENT W GRAM STAIN, RFLX TO RESP C

## 2023-06-29 LAB — HEPARIN LEVEL (UNFRACTIONATED)
Heparin Unfractionated: <0.1 IU/mL — ABNORMAL LOW (ref 0.30–0.70)
Heparin Unfractionated: <0.1 IU/mL — ABNORMAL LOW (ref 0.30–0.70)

## 2023-06-29 MED ORDER — ORAL CARE MOUTH RINSE: 15.0000 mL | OROMUCOSAL | Status: DC | PRN

## 2023-06-29 MED ORDER — MORPHINE SULFATE (PF) 2 MG/ML IV SOLN
0.5000 mg | INTRAVENOUS | Status: DC | PRN
  Administered 2023-06-29 – 2023-06-30 (×2): 0.5 mg via INTRAVENOUS
  Filled 2023-06-29 (×4): qty 1

## 2023-06-29 MED ORDER — LORAZEPAM 2 MG/ML IJ SOLN
1.0000 mg | Freq: Three times a day (TID) | INTRAMUSCULAR | Status: DC | PRN
  Administered 2023-06-29 – 2023-06-30 (×3): 1 mg via INTRAVENOUS
  Filled 2023-06-29 (×4): qty 1

## 2023-06-29 MED ORDER — ORAL CARE MOUTH RINSE
15.0000 mL | OROMUCOSAL | Status: DC
  Administered 2023-06-29 – 2023-07-03 (×19): 15 mL via OROMUCOSAL

## 2023-06-29 MED ORDER — BUDESONIDE 0.25 MG/2ML IN SUSP
0.2500 mg | Freq: Two times a day (BID) | RESPIRATORY_TRACT | Status: DC
  Administered 2023-06-29 – 2023-07-02 (×8): 0.25 mg via RESPIRATORY_TRACT
  Filled 2023-06-29 (×9): qty 2

## 2023-06-29 MED ORDER — IPRATROPIUM-ALBUTEROL 0.5-2.5 (3) MG/3ML IN SOLN
3.0000 mL | Freq: Three times a day (TID) | RESPIRATORY_TRACT | Status: DC
  Administered 2023-06-29 – 2023-07-02 (×11): 3 mL via RESPIRATORY_TRACT
  Filled 2023-06-29 (×11): qty 3

## 2023-06-29 NOTE — Assessment & Plan Note (Signed)
Continued carbamazepine. Seizure precaution.

## 2023-06-29 NOTE — Progress Notes (Signed)
*  PRELIMINARY RESULTS* Echocardiogram 2D Echocardiogram has been performed.  Jorge Rosales 06/29/2023, 10:47 AM

## 2023-06-29 NOTE — H&P (View-Only) (Signed)
Hospital Consult    Reason for Consult:  Pulmonary Embolism  Requesting Physician:  Dr Irena Cords MD MRN #:  [id number]  History of Present Illness: This is a 77 y.o. male with a history of COPD recent pneumonia. Reviewed previous discharge summary from July 26. Patient also has a history of PE on Coumadin. Patient presents today for increasing shortness of breath and reports he never felt like he got better. He continued to have a cough with shortness of breath worsening over the last few days at rehab.   On exam the morning the patient was resting comfortably in bed in the ICU.  Patient's daughter is at the bedside.  Patient is noted to be on 6 L of nasal cannula oxygen.  Patient endorses that he must sit up at 60 degrees or higher otherwise he is unable to breathe.  Patient currently denies any chest pain or dizziness.  Patient's vitals are stable with a tachycardic heart rate.  Upon workup patient has noted to have right lung pulmonary embolisms as well as right lower extremity DVT.  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  Started on Tegretol.  No further episodes.    Past Surgical History:  Procedure Laterality Date   APPENDECTOMY     BIL hip replacement  11/29/2011   BOWEL RESECTION  11/28/1965   INGUINAL HERNIA REPAIR Bilateral     Allergies  Allergen Reactions   Wound Dressing Adhesive Rash    Rash with blisters; band-aids  espescially. Per [facility] Clinical Summary   Latex Rash    Per [facility] Clinical Summary    Prior to Admission medications   Medication Sig Start Date End Date Taking? Authorizing Provider  albuterol (VENTOLIN HFA) 108 (90 Base) MCG/ACT inhaler Inhale 2 puffs into the lungs every 4 (four) hours as needed. 12/01/22   [provider]  carbamazepine (TEGRETOL) 200 MG tablet Take 1 tablet by mouth 3 (three) times daily. 09/28/22   [provider]  cyanocobalamin (VITAMIN B12) 1000 MCG tablet Take 1,000 mcg by mouth daily. PRN Patient not taking: Reported on 06/17/2023 12/28/22 12/28/23  [provider]  DULoxetine (CYMBALTA) 30 MG capsule Take 30 mg by mouth daily.    [provider]  feeding supplement (ENSURE ENLIVE / ENSURE PLUS) LIQD Take 237 mLs by mouth 3 (three) times daily between meals. 06/23/23   Marcelino Duster, MD  ipratropium-albuterol (DUONEB) 0.5-2.5 (3) MG/3ML SOLN Take 3 mLs by nebulization every 6 (six) hours as needed. 06/23/23   Marcelino Duster, MD  megestrol (MEGACE) 400 MG/10ML suspension Take 10 mLs (400 mg total) by mouth 2 (two) times daily. 06/23/23   Marcelino Duster, MD  Multiple Vitamin (MULTIVITAMIN WITH MINERALS) TABS tablet Take 1 tablet by mouth daily. 06/24/23   Marcelino Duster, MD  TRELEGY ELLIPTA 100-62.5-25 MCG/ACT AEPB Inhale 1 puff into the lungs daily. 01/03/23   [provider]  warfarin (COUMADIN) 5 MG tablet Take 1 tablet (5 mg total) by mouth daily at 4 PM. 06/23/23   Marcelino Duster, MD    Social History   Socioeconomic History   Marital status: Divorced    Spouse name: Not on file   Number of children: Not on file   Years of education: Not on file   Highest education level: Not on file  Occupational History   Occupation: [employer]    Comment: part time  Tobacco Use   Smoking status: Former    Current packs/day: 1.00    Average packs/day: 1 pack/day for 58.6 years (58.6 ttl  pk-yrs)    Types: Cigarettes    Start date: 11/28/1964   Smokeless tobacco: Not on file  Vaping Use   Vaping status: Never Used  Substance and Sexual Activity   Alcohol use: Yes    Comment: 2 beers/day   Drug use: Never   Sexual activity: Not on file  Other Topics Concern   Not on file  Social History Narrative   Diet:    Breakfast: Cereal, muffins, toast, eggs, bacon, 2 cups coffee   Late Lunch: Cheese and Bologna, Cheese and Ham   Dinner: Beef stew in the crock pot, Eats out a lot - [employer], Risk analyst: 3 glasses a day, sweet tea, coffee      Social: Engineer, water, Ride around, Animator, Read      Sleep: Sleeps in recliner at least 6 - 8 hrs a night - Does not use oxygen at night    Social Determinants of Health   Financial Resource Strain: Low Risk  (10/26/2022)   Received from [facility]   Overall Financial Resource Strain (CARDIA)    Difficulty of Paying Living Expenses: Not very hard  Food Insecurity: No Food Insecurity (06/18/2023)   Hunger Vital Sign    Worried About Running Out of Food in the Last Year: Never true    Ran Out of Food in the Last Year: Never true  Transportation Needs: No Transportation Needs (06/18/2023)   PRAPARE - Administrator, Civil Service (Medical): No    Lack of Transportation (Non-Medical): No  Physical Activity: Not on file  Stress: Not on file  Social Connections: Not on file  Intimate Partner Violence: Not At Risk (06/18/2023)   Humiliation, Afraid, Rape, and Kick questionnaire    Fear of Current or Ex-Partner: No    Emotionally Abused: No    Physically Abused: No    Sexually Abused: No     Family History  Problem Relation Age of Onset   Heart disease Father    Lung cancer Father     ROS: Otherwise negative unless mentioned in HPI  Physical Examination  Vitals:   06/29/23 0729 06/29/23 0800  BP:    Pulse:  100  Resp:    Temp:  (!) 97.5 F (36.4 C)   SpO2: 98% 100%   Body mass index is 17.68 kg/m.  General:  WDWN in NAD Gait: Not observed HENT: WNL, normocephalic Pulmonary: labored breathing, with Rales, rhonchi,  and wheezing. Cardiac: regular, Tachycardia, without  Murmurs, rubs or gallops; without carotid bruits Abdomen: Positive bowel sounds,  soft, NT/ND, no masses Skin: without rashes Vascular Exam/Pulses: Palpable pulses throughout. Extremities: without ischemic changes, without Gangrene , without cellulitis; without open wounds;  Musculoskeletal: no muscle wasting or atrophy  Neurologic: A&O X 3;  No focal  weakness or paresthesias are detected; speech is fluent/normal Psychiatric:  The pt has Normal affect. Lymph:  Unremarkable  CBC    Component Value Date/Time   WBC 18.6 (H) 06/19/2023 1806   RBC 3.94 (L) 06/08/2023 1806   HGB 12.0 (L) 06/11/2023 1806   HCT 37.3 (L) 06/12/2023 1806   PLT 506 (H) 06/12/2023 1806   MCV 94.7 06/01/2023 1806   MCH 30.5 06/05/2023 1806   MCHC 32.2 05/30/2023 1806   RDW 12.7 06/09/2023 1806   LYMPHSABS 1.9 06/17/2023 1806   MONOABS 1.1 (H) 06/24/2023 1806   EOSABS 0.1 05/29/2023 1806   BASOSABS 0.1 06/01/2023 1806    BMET    Component Value Date/Time   NA 133 (L) 06/29/2023 0837   K 4.4 06/29/2023 0837   CL 98 06/29/2023 0837   CO2 25 06/29/2023 0837   GLUCOSE 127 (H) 06/29/2023 0837   BUN 12 06/29/2023 0837   CREATININE 0.50 (L) 06/29/2023 0837   CALCIUM 7.8 (L) 06/29/2023 0837   GFRNONAA >60 06/29/2023 0837    COAGS: Lab Results  Component Value Date   INR 1.9 (H) 06/26/2023   INR 1.7 (H) 06/23/2023   INR 2.6 (H) 06/22/2023     Non-Invasive Vascular Imaging:   EXAM:05/30/2023 CT ANGIOGRAPHY CHEST WITH CONTRAST   TECHNIQUE: Multidetector CT imaging of the chest was performed using the standard protocol during bolus administration of intravenous contrast. Multiplanar CT image reconstructions and MIPs were obtained to evaluate the vascular anatomy.    RADIATION DOSE REDUCTION: This exam was performed according to the departmental dose-optimization program which includes automated exposure control, adjustment of the mA and/or kV according to patient size and/or use of iterative reconstruction technique.   CONTRAST:  75mL OMNIPAQUE IOHEXOL 350 MG/ML SOLN   COMPARISON:  CT angiogram chest 06/20/2023   FINDINGS: Cardiovascular: There is adequate opacification of the pulmonary arteries. Pulmonary emboli are seen within the distal right main pulmonary artery extending into segmental and subsegmental branches of the right upper lobe. There also segmental right lower lobe and right middle lobe pulmonary emboli.   Heart is normal in size. No evidence for aortic aneurysm. There are atherosclerotic calcifications of the aorta. There is no pericardial effusion.   Mediastinum/Nodes: No enlarged mediastinal, hilar, or axillary lymph nodes. Thyroid gland, trachea, and esophagus demonstrate no significant findings.   Lungs/Pleura: Large left upper lobe infiltrate with air bronchograms again seen. There is a new air-fluid level in the anterior apex measuring 3.5 x 4.5 by 2.0 cm. Focal spiculated density in the posterior left lower lobe appears unchanged. Lingular airspace disease has decreased, but there is some persistent ground-glass opacity in this region. Linear opacity with ground-glass in the right upper lobe appears unchanged. Emphysematous changes are again noted. No pleural effusion or pneumothorax.   Upper Abdomen: No acute findings. There is a right renal cyst measuring 2.1 cm.   Musculoskeletal: Multilevel degenerative changes affect the spine.   Review of the MIP images confirms the above findings.   IMPRESSION: 1. Acute pulmonary emboli in the distal right main pulmonary artery extending into segmental and subsegmental branches of the right upper lobe. Additional segmental and subsegmental pulmonary emboli in the right  lower lobe and right middle lobe. Positive for acute PE with CT evidence of right heart strain (RV/LV Ratio = 1.3) consistent with at least submassive (intermediate risk) PE. The presence of right heart strain has been associated with an increased risk of morbidity and mortality. 2. New air-fluid level in the  left apex worrisome for abscess. 3. Left upper lobe infiltrate otherwise appears unchanged. 4. Lingular airspace disease has decreased, but there is some persistent ground-glass opacity in this region. 5. Spiculated density in the left lower lobe is unchanged. 6. Stable ground-glass opacity in the right upper lobe.  EXAM: BILATERAL LOWER EXTREMITY VENOUS DOPPLER ULTRASOUND   TECHNIQUE: Gray-scale sonography with graded compression, as well as color Doppler and duplex ultrasound were performed to evaluate the lower extremity deep venous systems from the level of the common femoral vein and including the common femoral, femoral, profunda femoral, popliteal and calf veins including the posterior tibial, peroneal and gastrocnemius veins when visible. The superficial great saphenous vein was also interrogated. Spectral Doppler was utilized to evaluate flow at rest and with distal augmentation maneuvers in the common femoral, femoral and popliteal veins.   COMPARISON:  None Available.   FINDINGS: RIGHT LOWER EXTREMITY   Common Femoral Vein: No evidence of thrombus. Normal compressibility, respiratory phasicity and response to augmentation.   Saphenofemoral Junction: No evidence of thrombus. Normal compressibility and flow on color Doppler imaging.   Profunda Femoral Vein: No evidence of thrombus. Normal compressibility and flow on color Doppler imaging.   Femoral Vein: No evidence of thrombus. Normal compressibility, respiratory phasicity and response to augmentation.   Popliteal Vein: Nonocclusive, mildly expansile, thrombus is present. The thrombus is mostly hypoechoic  with a few areas of heterogeneously hyperechoic linear morphology.   Calf Veins: Occlusive, hypoechoic, minimally expansile thrombus throughout.   Other Findings: Mostly simple appearing hypoechoic structure in the popliteal fossa measuring proximally 2.1 x 1.3 x 1.1 cm.   LEFT LOWER EXTREMITY   Common Femoral Vein: No evidence of thrombus. Normal compressibility, respiratory phasicity and response to augmentation.   Saphenofemoral Junction: No evidence of thrombus. Normal compressibility and flow on color Doppler imaging.   Profunda Femoral Vein: No evidence of thrombus. Normal compressibility and flow on color Doppler imaging.   Femoral Vein: There is mildly expansile, heterogeneously isoechoic, occlusive thrombus in the central segment of the femoral vein. The peripheral femoral vein is patent.   Popliteal Vein: Patent centrally with occlusive, mildly expansile, heterogeneously isoechoic thrombus through the peripheral popliteal extending into the calf veins.   Calf Veins: Occlusive, heterogeneously isoechoic thrombus throughout.   Other Findings:  None.   IMPRESSION: 1. Subacute appearing right lower extremity deep vein thrombosis limited to the popliteal and calf veins. 2. Subacute appearing left lower extremity deep vein thrombosis in the central left femoral vein in addition to the peripheral popliteal vein extending into the calf veins. 3. No evidence of iliac vein extension. 4. Right simple appearing Baker cyst measuring up to 2.1 cm.   Statin:  No. Beta Blocker:  No. Aspirin:  No. ACEI:  No. ARB:  No. CCB use:  No Other antiplatelets/anticoagulants:  Yes.   Coumadin 5 mg daily.    ASSESSMENT/PLAN: This is a 77 y.o. male who presents to [facility] emergency department with increasing shortness of breath that has been worsening over the last few days while at rehab.  Upon workup patient was found to have right lobe pulmonary embolisms as well as right lower  extremity DVTs.  Vascular Surgery plans on taking the patient to the vascular lab later today for a Pulmonary Thrombectomy and Inferior Vena cava Filter placement.  Pulmonary critical care would like to treat the patient's shortness of breath by treating his known pneumonia prior to any procedure for pulmonary embolism and DVT.  Patient is currently on IV  antibiotics and IV heparin for the noted thrombosis.  Echocardiogram will be ordered to determine if patient's right heart strain is significant.  -I discussed the plan in detail with Dr. Elbert Ewings MD and he agrees with the plan.   Marcie Bal [facility] 06/29/2023 9:23 AM

## 2023-06-29 NOTE — Progress Notes (Signed)
[place] at [facility]   PATIENT NAME: Jorge Rosales    MR#:  [id number]  PCP: [facility]  DATE OF BIRTH:  02-05-1946  SUBJECTIVE:  CHIEF COMPLAINT:   Chief Complaint  Patient presents with   Shortness of Breath  Short of breath REVIEW OF SYSTEMS:  Review of Systems  Respiratory:  Positive for shortness of breath.   All other systems reviewed and are negative.  DRUG ALLERGIES:   Allergies  Allergen Reactions   Wound Dressing Adhesive Rash    Rash with blisters; band-aids espescially. Per [facility] Clinical Summary   Latex Rash    Per [facility] Clinical Summary   VITALS:  Blood pressure 128/75, pulse 76, temperature 97.9 F (36.6 C), temperature source Oral, resp. rate 19, height 5\' 10"  (1.778 m), weight 55.9 kg, SpO2 100%. PHYSICAL EXAMINATION:  Physical Exam Vitals and nursing note reviewed.  Constitutional:      Appearance: He is ill-appearing.  HENT:     Head: Normocephalic and atraumatic.  Eyes:     Extraocular Movements: Extraocular movements intact.     Pupils: Pupils are equal, round, and reactive to light.  Cardiovascular:     Rate and Rhythm: Normal rate and regular rhythm.  Pulmonary:     Effort: Pulmonary effort is normal.     Breath sounds: Examination of the right-lower field reveals decreased breath sounds. Examination of the left-lower field reveals decreased breath sounds. Decreased breath sounds present.  Abdominal:     General: Bowel sounds are normal.     Palpations: Abdomen is soft.  Musculoskeletal:        General: Normal range of motion.     Cervical back: Normal range of motion and neck supple.  Skin:    General: Skin is warm and dry.  Neurological:     General: No focal deficit present.     Mental Status: He is alert and oriented to person, place, and time.  Psychiatric:        Mood and Affect: Mood normal.        Behavior: Behavior normal.    LABORATORY PANEL:  Male CBC Recent Labs  Lab 06/28/23 1806  WBC  18.6*  HGB 12.0*  HCT 37.3*  PLT 506*   ------------------------------------------------------------------------------------------------------------------ Chemistries  Recent Labs  Lab 06/28/23 1806 06/29/23 0837  NA 130* 133*  K 3.9 4.4  CL 96* 98  CO2 28 25  GLUCOSE 187* 127*  BUN 16 12  CREATININE 0.54* 0.50*  CALCIUM 7.7* 7.8*  AST 20  --   ALT 17  --   ALKPHOS 125  --   BILITOT 0.4  --    MEDICATIONS:  Scheduled Meds:  budesonide (PULMICORT) nebulizer solution  0.25 mg Nebulization BID   Chlorhexidine Gluconate Cloth  6 each Topical Daily   docusate sodium  100 mg Oral BID   DULoxetine  30 mg Oral Daily   ipratropium-albuterol  3 mL Nebulization Once   ipratropium-albuterol  3 mL Nebulization TID AC   mouth rinse  15 mL Mouth Rinse 4 times per day   pantoprazole (PROTONIX) IV  40 mg Intravenous Q24H   sodium chloride flush  3 mL Intravenous Q12H   Warfarin - Pharmacist Dosing Inpatient   Does not apply q1600   Continuous Infusions:  sodium chloride 10 mL/hr at 06/29/23 1629   heparin 1,150 Units/hr (06/29/23 1629)   piperacillin-tazobactam (ZOSYN)  IV 3.375 g (06/29/23 1654)   vancomycin     RADIOLOGY:  ECHOCARDIOGRAM COMPLETE  Result Date: 06/29/2023    ECHOCARDIOGRAM REPORT   Patient Name:   Jorge Rosales Date of Exam: 06/29/2023 Medical Rec #:  [id number]    Height:       70.0 in Accession #:    [id number]   Weight:       123.2 lb Date of Birth:  13-Feb-1946     BSA:          1.699 m Patient Age:    77 years     BP:           123/79 mmHg Patient Gender: M            HR:           100 bpm. Exam Location:  [facility] Procedure: 2D Echo, Cardiac Doppler and Color Doppler Indications:     Pulmonary embolus I26.09  History:         Patient has prior history of Echocardiogram examinations, most                  recent 05/16/2023. COPD. History of pulmonary embolism, former                  heavy smoker.  Sonographer:     Cristela Blue Referring Phys:  [id number] BRITTON L RUST-CHESTER  Diagnosing Phys: Julien Nordmann MD  Sonographer Comments: Technically challenging study due to limited acoustic windows and no apical window. Image acquisition challenging due to COPD. IMPRESSIONS  1. Left ventricular ejection fraction, by estimation, is 60 to 65%. The left ventricle has normal function. The left ventricle has no regional wall motion abnormalities. Left ventricular diastolic parameters are indeterminate.  2. Right ventricular systolic function is normal. The right ventricular size is normal. There is mildly elevated pulmonary artery systolic pressure. The estimated right ventricular systolic pressure is 41.0 mmHg.  3. The mitral valve is normal in structure. No evidence of mitral valve regurgitation. No evidence of mitral stenosis.  4. Tricuspid valve regurgitation is mild to moderate.  5. The aortic valve has an indeterminant number of cusps. Aortic valve regurgitation is not visualized. No aortic stenosis is present.  6. The inferior vena cava is normal in size with greater than 50% respiratory variability, suggesting right atrial pressure of 3 mmHg. FINDINGS  Left Ventricle: Left ventricular ejection fraction, by estimation, is 60 to 65%. The left ventricle has normal function. The left ventricle has no regional wall motion abnormalities. The left ventricular internal cavity size was normal in size. There is  no left ventricular hypertrophy. Left ventricular diastolic parameters are indeterminate. Right Ventricle: The right ventricular size is normal. No increase in right ventricular wall thickness. Right ventricular systolic function is normal. There is mildly elevated pulmonary artery systolic pressure. The tricuspid regurgitant velocity is 3.00  m/s, and with an assumed right atrial pressure of 5 mmHg, the estimated right ventricular systolic pressure is 41.0 mmHg. Left Atrium: Left atrial size was normal in size. Right Atrium: Right atrial size was normal in size. Pericardium: There is no  evidence of pericardial effusion. Mitral Valve: The mitral valve is normal in structure. No evidence of mitral valve regurgitation. No evidence of mitral valve stenosis. Tricuspid Valve: The tricuspid valve is normal in structure. Tricuspid valve regurgitation is mild to moderate. No evidence of tricuspid stenosis. Aortic Valve: The aortic valve has an indeterminant number of cusps. Aortic valve regurgitation is not visualized. No aortic stenosis is present. Pulmonic Valve: The pulmonic valve was normal in structure.  Pulmonic valve regurgitation is not visualized. No evidence of pulmonic stenosis. Aorta: The aortic root is normal in size and structure. Venous: The inferior vena cava is normal in size with greater than 50% respiratory variability, suggesting right atrial pressure of 3 mmHg. IAS/Shunts: No atrial level shunt detected by color flow Doppler.  LEFT VENTRICLE PLAX 2D LVIDd:         4.30 cm LVIDs:         3.00 cm LV PW:         0.90 cm LV IVS:        0.80 cm LVOT diam:     2.10 cm LVOT Area:     3.46 cm  RIGHT VENTRICLE RV Basal diam:  4.10 cm RV Mid diam:    3.60 cm LEFT ATRIUM         Index LA diam:    1.90 cm 1.12 cm/m   AORTA Ao Root diam: 3.30 cm TRICUSPID VALVE TR Peak grad:   36.0 mmHg TR Vmax:        300.00 cm/s  SHUNTS Systemic Diam: 2.10 cm Julien Nordmann MD Electronically signed by Julien Nordmann MD Signature Date/Time: 06/29/2023/12:01:03 PM    Final    US Venous Img Lower Bilateral (DVT)  Result Date: 06/29/2023 CLINICAL DATA:  77 year old male with history of pulmonary embolism. EXAM: BILATERAL LOWER EXTREMITY VENOUS DOPPLER ULTRASOUND TECHNIQUE: Gray-scale sonography with graded compression, as well as color Doppler and duplex ultrasound were performed to evaluate the lower extremity deep venous systems from the level of the common femoral vein and including the common femoral, femoral, profunda femoral, popliteal and calf veins including the posterior tibial, peroneal and gastrocnemius  veins when visible. The superficial great saphenous vein was also interrogated. Spectral Doppler was utilized to evaluate flow at rest and with distal augmentation maneuvers in the common femoral, femoral and popliteal veins. COMPARISON:  None Available. FINDINGS: RIGHT LOWER EXTREMITY Common Femoral Vein: No evidence of thrombus. Normal compressibility, respiratory phasicity and response to augmentation. Saphenofemoral Junction: No evidence of thrombus. Normal compressibility and flow on color Doppler imaging. Profunda Femoral Vein: No evidence of thrombus. Normal compressibility and flow on color Doppler imaging. Femoral Vein: No evidence of thrombus. Normal compressibility, respiratory phasicity and response to augmentation. Popliteal Vein: Nonocclusive, mildly expansile, thrombus is present. The thrombus is mostly hypoechoic with a few areas of heterogeneously hyperechoic linear morphology. Calf Veins: Occlusive, hypoechoic, minimally expansile thrombus throughout. Other Findings: Mostly simple appearing hypoechoic structure in the popliteal fossa measuring proximally 2.1 x 1.3 x 1.1 cm. LEFT LOWER EXTREMITY Common Femoral Vein: No evidence of thrombus. Normal compressibility, respiratory phasicity and response to augmentation. Saphenofemoral Junction: No evidence of thrombus. Normal compressibility and flow on color Doppler imaging. Profunda Femoral Vein: No evidence of thrombus. Normal compressibility and flow on color Doppler imaging. Femoral Vein: There is mildly expansile, heterogeneously isoechoic, occlusive thrombus in the central segment of the femoral vein. The peripheral femoral vein is patent. Popliteal Vein: Patent centrally with occlusive, mildly expansile, heterogeneously isoechoic thrombus through the peripheral popliteal extending into the calf veins. Calf Veins: Occlusive, heterogeneously isoechoic thrombus throughout. Other Findings:  None. IMPRESSION: 1. Subacute appearing right lower  extremity deep vein thrombosis limited to the popliteal and calf veins. 2. Subacute appearing left lower extremity deep vein thrombosis in the central left femoral vein in addition to the peripheral popliteal vein extending into the calf veins. 3. No evidence of iliac vein extension. 4. Right simple appearing Baker cyst measuring up to  2.1 cm. These results will be called to the ordering clinician or representative by the Radiologist Assistant, and communication documented in the PACS or [employer]. Marliss Coots, MD [facility] Rad[facility] Electronically Signed   By: Marliss Coots M.D.   On: 06/29/2023 08:08   CT Angio Chest Pulmonary Embolism (PE) W or WO Contrast  Result Date: 06/28/2023 CLINICAL DATA:  High probability for PE.  Shortness of breath. EXAM: CT ANGIOGRAPHY CHEST WITH CONTRAST TECHNIQUE: Multidetector CT imaging of the chest was performed using the standard protocol during bolus administration of intravenous contrast. Multiplanar CT image reconstructions and MIPs were obtained to evaluate the vascular anatomy. RADIATION DOSE REDUCTION: This exam was performed according to the departmental dose-optimization program which includes automated exposure control, adjustment of the mA and/or kV according to patient size and/or use of iterative reconstruction technique. CONTRAST:  75mL OMNIPAQUE IOHEXOL 350 MG/ML SOLN COMPARISON:  CT angiogram chest 06/20/2023 FINDINGS: Cardiovascular: There is adequate opacification of the pulmonary arteries. Pulmonary emboli are seen within the distal right main pulmonary artery extending into segmental and subsegmental branches of the right upper lobe. There also segmental right lower lobe and right middle lobe pulmonary emboli. Heart is normal in size. No evidence for aortic aneurysm. There are atherosclerotic calcifications of the aorta. There is no pericardial effusion. Mediastinum/Nodes: No enlarged mediastinal,  hilar, or axillary lymph nodes. Thyroid gland, trachea, and esophagus demonstrate no significant findings. Lungs/Pleura: Large left upper lobe infiltrate with air bronchograms again seen. There is a new air-fluid level in the anterior apex measuring 3.5 x 4.5 by 2.0 cm. Focal spiculated density in the posterior left lower lobe appears unchanged. Lingular airspace disease has decreased, but there is some persistent ground-glass opacity in this region. Linear opacity with ground-glass in the right upper lobe appears unchanged. Emphysematous changes are again noted. No pleural effusion or pneumothorax. Upper Abdomen: No acute findings. There is a right renal cyst measuring 2.1 cm. Musculoskeletal: Multilevel degenerative changes affect the spine. Review of the MIP images confirms the above findings. IMPRESSION: 1. Acute pulmonary emboli in the distal right main pulmonary artery extending into segmental and subsegmental branches of the right upper lobe. Additional segmental and subsegmental pulmonary emboli in the right lower lobe and right middle lobe. Positive for acute PE with CT evidence of right heart strain (RV/LV Ratio = 1.3) consistent with at least submassive (intermediate risk) PE. The presence of right heart strain has been associated with an increased risk of morbidity and mortality. 2. New air-fluid level in the left apex worrisome for abscess. 3. Left upper lobe infiltrate otherwise appears unchanged. 4. Lingular airspace disease has decreased, but there is some persistent ground-glass opacity in this region. 5. Spiculated density in the left lower lobe is unchanged. 6. Stable ground-glass opacity in the right upper lobe. Electronically Signed   By: Darliss Cheney M.D.   On: 06/28/2023 22:28   DG Chest 1 View  Result Date: 06/28/2023 CLINICAL DATA:  History of pneumonia, sepsis EXAM: CHEST  1 VIEW COMPARISON:  06/20/2023 FINDINGS: 2 frontal views of the chest demonstrate a stable cardiac silhouette.  There is persistent dense consolidation within the left upper lobe, with superior retraction of the left hilum consistent with volume loss. Airspace disease in the left perihilar region has slightly improved. Right chest is clear. Stable hyperinflation and emphysema. No effusion or pneumothorax. No acute bony abnormalities. IMPRESSION: 1. Persistent dense consolidation within the left upper lobe, compatible with pneumonia. The patchy areas  of airspace disease in the left perihilar region seen on prior chest x-ray demonstrate mild improvement. 2. Stable emphysema. Electronically Signed   By: Sharlet Salina M.D.   On: 06/28/2023 19:28   ASSESSMENT AND PLAN:  No notes on file  Principal Problem:   Respiratory failure with hypoxia (HCC) Active Problems:   Left upper lobe pneumonia   Sepsis (HCC)   Acute respiratory failure with hypoxia (HCC)   History of pulmonary embolism   COPD GOLD III   Seizure disorder (HCC)  Acute on chronic hypoxic respiratory failure Acute submassive PE Pneumonia with possible lung abscess COPD on chronic oxygen Former tobacco use Weaned off BiPAP on high flow nasal cannula now Pulmonary following Continue IV antibiotics for possible pneumonia Await ID input Continue nebs and bronchodilators  Acute submassive PE Subacute DVT in bilateral lower extremity Vascular surgery consult Continue heparin drip Echo Will consider transition to DOAC instead of Coumadin  Unintentional weight loss 40 pounds in last 6 months Patient was functional and driving before 6 1 but has had significant clinical and functional decline in last few months especially since last hospitalization.  Palliative care consult for goals of care discussion  History of seizure He had been on carbamazepine since 80s and had not seen neurology or not reported any further seizure Will stop carbamazepine at this time as it does have significant interaction with blood thinners  Body mass index is  17.68 kg/m.  Net IO Since Admission: 2,548.87 mL [06/29/23 1700]      LOS: 1 day   Consultants: ID Vascular surgery Intensivist Palliative care    Antibiotics: Vancomycin and Zosyn  Status is: Inpatient 3-4   DVT prophylaxis:   Heparin drip         Family Communication: Updated daughter at bedside   All the records are reviewed and case discussed with Nursing and TOC team. Management plans discussed with the patient, family and they are in agreement.  CODE STATUS: Full Code Level of care: Stepdown  TOTAL TIME TAKING CARE OF THIS PATIENT: 35 minutes.   More than 50% of the time was spent in counseling/coordination of care: YES  POSSIBLE D/C IN 3-4 DAYS, DEPENDING ON CLINICAL CONDITION.   Delfino Lovett M.D on 06/29/2023 at 5:00 PM  [facility]   CC: Primary care physician; [facility]  Note: This dictation was prepared with Dragon dictation along with smaller phrase technology. Any transcriptional errors that result from this process are unintentional.

## 2023-06-29 NOTE — Progress Notes (Signed)
Radiology called with the following report given to this RN. RN sent secure chat to MD Sherryll Burger with radiology report at [phone].   "Radiology called with DVT report: subacute RLE DVT in popliteal and calf vein as well as Central Left Femoral vein DVT"

## 2023-06-29 NOTE — Progress Notes (Addendum)
Attempted to obtain sputum specimen from patient per ID request. Patient very drowsy since receiving ativan and not able to provide sample at this time. Will re-attempt later.  Carmel Sacramento, RN

## 2023-06-29 NOTE — Progress Notes (Signed)
ANTICOAGULATION CONSULT NOTE  Pharmacy Consult for Warfarin, Heparin  Indication: DVT  Allergies  Allergen Reactions   Wound Dressing Adhesive Rash    Rash with blisters; band-aids espescially. Per [facility] Clinical Summary   Latex Rash    Per [facility] Clinical Summary    Patient Measurements: Height: 5\' 10"  (177.8 cm) Weight: 55.9 kg (123 lb 3.8 oz) IBW/kg (Calculated) : 73 Heparin Dosing Weight: 58 kg   Vital Signs: Temp: 97.5 F (36.4 C) (08/01 0800) Temp Source: Axillary (08/01 0800) BP: 123/79 (08/01 0700) Pulse Rate: 100 (08/01 0800)  Labs: Recent Labs    06/28/23 1806 06/29/23 0005 06/29/23 0837  HGB 12.0*  --   --   HCT 37.3*  --   --   PLT 506*  --   --   APTT  --  44*  --   LABPROT 21.6*  --   --   INR 1.9*  --   --   HEPARINUNFRC  --   --  <0.10*  CREATININE 0.54*  --  0.50*  TROPONINIHS 14  --   --     Estimated Creatinine Clearance: 62.1 mL/min (A) (by C-G formula based on SCr of 0.5 mg/dL (L)).   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  Started on Tegretol.  No further episodes.    Medications:  Medications Prior to Admission  Medication Sig Dispense Refill Last Dose   albuterol (VENTOLIN HFA) 108 (90 Base) MCG/ACT inhaler Inhale 2 puffs into the lungs every 4 (four) hours as needed.   06/28/2023   carbamazepine (TEGRETOL) 200 MG tablet Take 1 tablet by mouth 3 (three) times daily.    06/28/2023   DULoxetine (CYMBALTA) 30 MG capsule Take 30 mg by mouth daily.   06/28/2023   feeding supplement (ENSURE ENLIVE / ENSURE PLUS) LIQD Take 237 mLs by mouth 3 (three) times daily between meals. 237 mL 12 06/28/2023   ipratropium-albuterol (DUONEB) 0.5-2.5 (3) MG/3ML SOLN Take 3 mLs by nebulization every 6 (six) hours as needed. 360 mL 3 06/28/2023   megestrol (MEGACE) 400 MG/10ML suspension Take 10 mLs (400 mg total) by mouth 2 (two) times daily. 240 mL 0 06/28/2023   Multiple Vitamin (MULTIVITAMIN WITH MINERALS) TABS tablet Take 1 tablet by mouth daily. 30 tablet 3 06/28/2023   TRELEGY ELLIPTA 100-62.5-25 MCG/ACT AEPB Inhale 1 puff into the lungs daily.   06/28/2023   warfarin (COUMADIN) 5 MG tablet Take 1 tablet (5 mg total) by mouth daily at 4 PM. 30 tablet 0 06/28/2023    Assessment: Pharmacy consulted to dose heparin and warfarin for DVT treatment in this 77 year old male admitted with sepsis, PNA.   Pt was on warfarin 5 mg PO daily PTA  Goal of Therapy:  HL :  0.3 -0.7  Monitor platelets by anticoagulation protocol: Yes   Plan: 1st heparin level undetectable ---No bolus  per vascular surgery ---increase heparin infusion rate to 1150  units/hr ---Check anti-Xa level in 8 hours after rate change and at least once daily while on heparin ---Continue to monitor H&H and platelets   Lowella Bandy 06/29/2023,9:54 AM

## 2023-06-29 NOTE — Consult Note (Signed)
NAME: Jorge Rosales  DOB: 1946/11/15  MRN: [id number]  Date/Time: 06/29/2023 2:26 PM  REQUESTING PROVIDER: Dr.Patel Subjective:  REASON FOR CONSULT: Lung abscess ? No history available from patient  Jorge Rosales is a 77 y.o. male with a history of COPD /PE/was recently in the hospital between 7/20-7/26 for left upper lobe pneumonia that had a developed over chronic scarring in that area. He was treated with Iv antibiotics and Dc to SNF on Po antibiotics. HE returned on 06/28/23 with increasing SOB  Vitals in the ED BP of 106/55, temperature 97.3, pulse 102, respiratory 21 and sats of 99% on 4 L oxygen Labs revealed a WBC of 18.6, Hb 12, platelet 506.  Creatinine was 0.54. CT angio revealed a large left upper lobe infiltrate with air bronchograms.  There was a new air-fluid level in the anterior apex measuring 3.5 into 4.5 to 2 cm.  Focal spiculated density in the posterior left lower lobe appears similar to previous scans There was acute pulmonary emboli in the distal right main pulmonary artery extending to the segmental and subsegmental branches of the right upper lobe.  He was admitted to ICU.  Started on vancomycin and Zosyn I am asked to see patient for management of the abscess History of weight loss mentioned in the chart.   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  Started on Tegretol.  No further episodes.    Past Surgical History:   Procedure Laterality Date   APPENDECTOMY     BIL hip replacement  11/29/2011   BOWEL RESECTION  11/28/1965   INGUINAL HERNIA REPAIR Bilateral     Social History   Socioeconomic History   Marital status: Divorced    Spouse name: Not on file   Number of children: Not on file   Years of education: Not on file   Highest education level: Not on file  Occupational History   Occupation: [employer]    Comment: part time  Tobacco Use   Smoking status: Former    Current packs/day: 1.00    Average packs/day: 1 pack/day for 58.6 years (58.6 ttl pk-yrs)    Types: Cigarettes    Start date: 11/28/1964   Smokeless tobacco: Not on file  Vaping Use   Vaping status: Never Used  Substance and Sexual Activity   Alcohol use: Yes    Comment: 2 beers/day   Drug use: Never   Sexual activity: Not on file  Other Topics Concern   Not on file  Social History Narrative   Diet:    Breakfast: Cereal, muffins, toast, eggs, bacon, 2 cups coffee   Late Lunch: Cheese and Bologna, Cheese and Ham   Dinner: Beef stew in the crock pot, Eats out a lot - [employer], Risk analyst: 3 glasses a day, sweet tea, coffee      Social: Engineer, water, Ride around, Animator,  Read      Sleep: Sleeps in recliner at least 6 - 8 hrs a night - Does not use oxygen at night    Social Determinants of Health   Financial Resource Strain: Low Risk  (10/26/2022)   Received from [facility], [facility]   Overall Financial Resource Strain (CARDIA)    Difficulty of Paying Living Expenses: Not very hard  Food Insecurity: No Food Insecurity (06/18/2023)   Hunger Vital Sign    Worried About Running Out of Food in the Last Year: Never true    Ran Out of Food in the Last Year: Never true  Transportation Needs: No Transportation Needs (06/18/2023)   PRAPARE - Administrator, Civil Service (Medical): No    Lack of Transportation (Non-Medical): No  Physical Activity: Not on  file  Stress: Not on file  Social Connections: Not on file  Intimate Partner Violence: Not At Risk (06/18/2023)   Humiliation, Afraid, Rape, and Kick questionnaire    Fear of Current or Ex-Partner: No    Emotionally Abused: No    Physically Abused: No    Sexually Abused: No    Family History  Problem Relation Age of Onset   Heart disease Father    Lung cancer Father    Allergies  Allergen Reactions   Wound Dressing Adhesive Rash    Rash with blisters; band-aids espescially. Per [facility] Clinical Summary   Latex Rash    Per [facility] Clinical Summary   I? Current Facility-Administered Medications  Medication Dose Route Frequency Provider Last Rate Last Admin   0.9 %  sodium chloride infusion   Intravenous Continuous Gertha Calkin, MD 10 mL/hr at 06/29/23 0800 Infusion Verify at 06/29/23 0800   acetaminophen (TYLENOL) tablet 650 mg  650 mg Oral Q6H PRN Gertha Calkin, MD       Or   acetaminophen (TYLENOL) suppository 650 mg  650 mg Rectal Q6H PRN Gertha Calkin, MD       albuterol (PROVENTIL) (2.5 MG/3ML) 0.083% nebulizer solution 2.5 mg  2.5 mg Nebulization Q2H PRN Gertha Calkin, MD       bisacodyl (DULCOLAX) EC tablet 5 mg  5 mg Oral Daily PRN Gertha Calkin, MD       budesonide (PULMICORT) nebulizer solution 0.25 mg  0.25 mg Nebulization BID Rust-Chester, Britton L, NP   0.25 mg at 06/29/23 0729   Chlorhexidine Gluconate Cloth 2 % PADS 6 each  6 each Topical Daily Gertha Calkin, MD   6 each at 06/29/23 1110   docusate sodium (COLACE) capsule 100 mg  100 mg Oral BID Irena Cords V, MD   100 mg at 06/29/23 1047   DULoxetine (CYMBALTA) DR capsule 30 mg  30 mg Oral Daily Irena Cords V, MD   30 mg at 06/29/23 1048   guaiFENesin (MUCINEX) 12 hr tablet 600 mg  600 mg Oral BID PRN Gertha Calkin, MD       heparin ADULT infusion 100 units/mL (25000 units/267mL)  1,150 Units/hr Intravenous Continuous Lowella Bandy, RPH 11.5 mL/hr at 06/29/23 1007 1,150 Units/hr at 06/29/23 1007   hydrALAZINE  (APRESOLINE) injection 5 mg  5 mg Intravenous Q4H PRN Gertha Calkin, MD       ipratropium-albuterol (DUONEB) 0.5-2.5 (3) MG/3ML nebulizer solution 3 mL  3 mL Nebulization Once Gertha Calkin, MD       ipratropium-albuterol (DUONEB) 0.5-2.5 (3) MG/3ML nebulizer solution 3 mL  3  mL Nebulization TID AC Rust-Chester, Britton L, NP   3 mL at 06/29/23 1355   lactated ringers infusion   Intravenous Continuous Gertha Calkin, MD 150 mL/hr at 06/29/23 1059 New Bag at 06/29/23 1059   LORazepam (ATIVAN) injection 1 mg  1 mg Intravenous Q8H PRN Delfino Lovett, MD   1 mg at 06/29/23 0814   morphine (PF) 2 MG/ML injection 0.5 mg  0.5 mg Intravenous Q4H PRN Delfino Lovett, MD   0.5 mg at 06/29/23 1119   ondansetron (ZOFRAN) tablet 4 mg  4 mg Oral Q6H PRN Gertha Calkin, MD       Or   ondansetron (ZOFRAN) injection 4 mg  4 mg Intravenous Q6H PRN Gertha Calkin, MD       Oral care mouth rinse  15 mL Mouth Rinse 4 times per day Gertha Calkin, MD   15 mL at 06/29/23 1133   Oral care mouth rinse  15 mL Mouth Rinse PRN Gertha Calkin, MD       Oral care mouth rinse  15 mL Mouth Rinse PRN Delfino Lovett, MD       pantoprazole (PROTONIX) injection 40 mg  40 mg Intravenous Q24H Rust-Chester, Britton L, NP   40 mg at 06/29/23 0031   piperacillin-tazobactam (ZOSYN) IVPB 3.375 g  3.375 g Intravenous Q8H Irena Cords V, MD 12.5 mL/hr at 06/29/23 0814 3.375 g at 06/29/23 0814   polyethylene glycol (MIRALAX / GLYCOLAX) packet 17 g  17 g Oral Daily PRN Gertha Calkin, MD       sodium chloride flush (NS) 0.9 % injection 3 mL  3 mL Intravenous Q12H Irena Cords V, MD   3 mL at 06/29/23 0844   vancomycin (VANCOREADY) IVPB 1250 mg/250 mL  1,250 mg Intravenous Q24H Gertha Calkin, MD       Warfarin - Pharmacist Dosing Inpatient   Does not apply [id number] Gertha Calkin, MD         Abtx:  Anti-infectives (From admission, onward)    Start     Dose/Rate Route Frequency Ordered Stop   06/29/23 2200  azithromycin (ZITHROMAX) 500 mg in sodium chloride 0.9  % 250 mL IVPB  Status:  Discontinued        500 mg 250 mL/hr over 60 Minutes Intravenous Every 24 hours 06/28/23 2233 06/28/23 2319   06/29/23 2000  vancomycin (VANCOREADY) IVPB 1250 mg/250 mL        1,250 mg 166.7 mL/hr over 90 Minutes Intravenous Every 24 hours 06/28/23 2300     06/29/23 0400  ceFEPIme (MAXIPIME) 2 g in sodium chloride 0.9 % 100 mL IVPB  Status:  Discontinued        2 g 200 mL/hr over 30 Minutes Intravenous Every 8 hours 06/28/23 2248 06/28/23 2322   06/29/23 0015  piperacillin-tazobactam (ZOSYN) IVPB 3.375 g        3.375 g 12.5 mL/hr over 240 Minutes Intravenous Every 8 hours 06/28/23 2324     06/28/23 2015  azithromycin (ZITHROMAX) 500 mg in sodium chloride 0.9 % 250 mL IVPB        500 mg 250 mL/hr over 60 Minutes Intravenous  Once 06/28/23 2014 06/28/23 2245   06/28/23 1945  vancomycin (VANCOCIN) IVPB 1000 mg/200 mL premix  Status:  Discontinued        1,000 mg 200 mL/hr over 60 Minutes Intravenous  Once 06/28/23 1937 06/28/23 1939   06/28/23 1945  ceFEPIme (MAXIPIME) 2 g in sodium chloride  0.9 % 100 mL IVPB        2 g 200 mL/hr over 30 Minutes Intravenous  Once 06/28/23 1937 06/28/23 2028   06/28/23 1945  vancomycin (VANCOREADY) IVPB 1250 mg/250 mL        1,250 mg 166.7 mL/hr over 90 Minutes Intravenous  Once 06/28/23 1939 06/28/23 2255       REVIEW OF SYSTEMS:  NA Objective:  VITALS:  BP 118/77   Pulse 65   Temp 97.6 F (36.4 C) (Oral)   Resp 18   Ht 5\' 10"  (1.778 m)   Wt 55.9 kg   SpO2 100%   BMI 17.68 kg/m   PHYSICAL EXAM:  General: somnolent, on calling his name he responds to simple questions temporarily, chronically ill, emaciated Head: Normocephalic, without obvious abnormality, atraumatic. Eyes: Conjunctivae clear, anicteric sclerae. Pupils are equal ENTcannot examine Neck:  symmetrical, no adenopathy, thyroid: non tender no carotid bruit and no JVD. Lungs: b/l air entry, crepts left side, few rhonchi Heart: Tachycardia Abdomen: Soft,  non-tender,not distended. Bowel sounds normal. No masses Extremities: atraumatic, no cyanosis. No edema. No clubbing Skin: No rashes or lesions. Or bruising Lymph: Cervical, supraclavicular normal. Neurologic: cannot be assessed  Pertinent Labs Lab Results CBC    Component Value Date/Time   WBC 18.6 (H) 06/28/2023 1806   RBC 3.94 (L) 06/28/2023 1806   HGB 12.0 (L) 06/28/2023 1806   HCT 37.3 (L) 06/28/2023 1806   PLT 506 (H) 06/28/2023 1806   MCV 94.7 06/28/2023 1806   MCH 30.5 06/28/2023 1806   MCHC 32.2 06/28/2023 1806   RDW 12.7 06/28/2023 1806   LYMPHSABS 1.9 06/28/2023 1806   MONOABS 1.1 (H) 06/28/2023 1806   EOSABS 0.1 06/28/2023 1806   BASOSABS 0.1 06/28/2023 1806       Latest Ref Rng & Units 06/29/2023    8:37 AM 06/28/2023    6:06 PM 06/23/2023    3:48 AM  CMP  Glucose 70 - 99 mg/dL 161  096  045   BUN 8 - 23 mg/dL 12  16  12    Creatinine 0.61 - 1.24 mg/dL 4.09  8.11  9.14   Sodium 135 - 145 mmol/L 133  130  137   Potassium 3.5 - 5.1 mmol/L 4.4  3.9  3.0   Chloride 98 - 111 mmol/L 98  96  99   CO2 22 - 32 mmol/L 25  28  30    Calcium 8.9 - 10.3 mg/dL 7.8  7.7  7.7   Total Protein 6.5 - 8.1 g/dL  7.5    Total Bilirubin 0.3 - 1.2 mg/dL  0.4    Alkaline Phos 38 - 126 U/L  125    AST 15 - 41 U/L  20    ALT 0 - 44 U/L  17        Microbiology: Recent Results (from the past 240 hour(s))  Culture, blood (Routine x 2)     Status: None (Preliminary result)   Collection Time: 06/28/23  6:06 PM   Specimen: BLOOD  Result Value Ref Range Status   Specimen Description BLOOD BLOOD RIGHT ARM  Final   Special Requests   Final    BOTTLES DRAWN AEROBIC AND ANAEROBIC Blood Culture adequate volume   Culture   Final    NO GROWTH < 24 HOURS Performed at [facility], [address] [place], [place] [address]    Report Status PENDING  Incomplete  Culture, blood (Routine x 2)  Status: None (Preliminary result)   Collection Time: 06/28/23  6:06 PM   Specimen:  BLOOD  Result Value Ref Range Status   Specimen Description BLOOD BLOOD LEFT FOREARM  Final   Special Requests   Final    BOTTLES DRAWN AEROBIC AND ANAEROBIC Blood Culture adequate volume   Culture   Final    NO GROWTH < 24 HOURS Performed at [facility], [address] [place], [place] [address]    Report Status PENDING  Incomplete  Resp Panel by RT-PCR (Flu A&B, Covid) Anterior Nasal Swab     Status: None   Collection Time: 06/28/23  9:38 PM   Specimen: Anterior Nasal Swab  Result Value Ref Range Status   SARS Coronavirus 2 by RT PCR NEGATIVE NEGATIVE Final    Comment: (NOTE) SARS-CoV-2 target nucleic acids are NOT DETECTED.  The SARS-CoV-2 RNA is generally detectable in upper respiratory specimens during the acute phase of infection. The lowest concentration of SARS-CoV-2 viral copies this assay can detect is 138 copies/mL. A negative result does not preclude SARS-Cov-2 infection and should not be used as the sole basis for treatment or other patient management decisions. A negative result may occur with  improper specimen collection/handling, submission of specimen other than nasopharyngeal swab, presence of viral mutation(s) within the areas targeted by this assay, and inadequate number of viral copies(<138 copies/mL). A negative result must be combined with clinical observations, patient history, and epidemiological information. The expected result is Negative.  Fact Sheet for Patients:  BloggerCourse.com  Fact Sheet for Healthcare Providers:  SeriousBroker.it  This test is no t yet approved or cleared by the [place] FDA and  has been authorized for detection and/or diagnosis of SARS-CoV-2 by FDA under an Emergency Use Authorization (EUA). This EUA will remain  in effect (meaning this test can be used) for the duration of the COVID-19 declaration under Section 564(b)(1) of the Act, 21 U.S.C.section  360bbb-3(b)(1), unless the authorization is terminated  or revoked sooner.       Influenza A by PCR NEGATIVE NEGATIVE Final   Influenza B by PCR NEGATIVE NEGATIVE Final    Comment: (NOTE) The Xpert Xpress SARS-CoV-2/FLU/RSV plus assay is intended as an aid in the diagnosis of influenza from Nasopharyngeal swab specimens and should not be used as a sole basis for treatment. Nasal washings and aspirates are unacceptable for Xpert Xpress SARS-CoV-2/FLU/RSV testing.  Fact Sheet for Patients: BloggerCourse.com  Fact Sheet for Healthcare Providers: SeriousBroker.it  This test is not yet approved or cleared by the [place] FDA and has been authorized for detection and/or diagnosis of SARS-CoV-2 by FDA under an Emergency Use Authorization (EUA). This EUA will remain in effect (meaning this test can be used) for the duration of the COVID-19 declaration under Section 564(b)(1) of the Act, 21 U.S.C. section 360bbb-3(b)(1), unless the authorization is terminated or revoked.  Performed at [facility], [address] [place], [place] [address]   MRSA Next Gen by PCR, Nasal     Status: None   Collection Time: 06/28/23 11:29 PM   Specimen: Nasal Mucosa; Nasal Swab  Result Value Ref Range Status   MRSA by PCR Next Gen NOT DETECTED NOT DETECTED Final    Comment: (NOTE) The GeneXpert MRSA Assay (FDA approved for NASAL specimens only), is one component of a comprehensive MRSA colonization surveillance program. It is not intended to diagnose MRSA infection nor to guide or monitor treatment for MRSA infections. Test performance is not FDA approved  in patients less than 36 years old. Performed at [facility], [address] [place], [place] [address]   Expectorated Sputum Assessment w Gram Stain, Rflx to Resp Cult     Status: None   Collection Time: 06/29/23  8:29 AM   Specimen: Sputum  Result Value Ref Range  Status   Specimen Description SPUTUM  Final   Special Requests NONE  Final   Sputum evaluation   Final    THIS SPECIMEN IS ACCEPTABLE FOR SPUTUM CULTURE Performed at [facility], [address] [place], [place] [address]    Report Status 06/29/2023 FINAL  Final    IMAGING RESULTS:  I have personally reviewed the films Left upper lobe infiltrate with lung abscess  Impression/Recommendation 77 yr male?with history of COPD admitted with shortness of breath He was recently hospitalized for left upper lobe pneumonia and was discharged on 06/23/2023 to the skilled nursing facility.  Acute hypoxic respiratory failure Acute PE Currently on anticoagulation  Left upper lobe pneumonia with an lung abscess This started on the previous scar tissue in the lung.  Those are likely to be bacterial infection need to rule out TB or atypical mycobacteria.  Will need 3 sputum's for AFB Patient to be placed in airborne isolation Patient is currently on vancomycin and Zosyn MRSA nares negative Discontinue vancomycin  Seizure disorder was on Tegretol as outpatient  Hypoalbuminemia  COPD  Weight loss  Discussed the management with the care team.

## 2023-06-29 NOTE — Progress Notes (Signed)
ANTICOAGULATION CONSULT NOTE  Pharmacy Consult for Warfarin, Heparin  Indication: DVT  Allergies  Allergen Reactions   Wound Dressing Adhesive Rash    Rash with blisters; band-aids espescially. Per [facility] Clinical Summary   Latex Rash    Per [facility] Clinical Summary    Patient Measurements: Height: 5\' 10"  (177.8 cm) Weight: 55.9 kg (123 lb 3.8 oz) IBW/kg (Calculated) : 73 Heparin Dosing Weight: 58 kg   Vital Signs: Temp: 97.9 F (36.6 C) (08/01 1600) Temp Source: Oral (08/01 1600) BP: 145/97 (08/01 1900) Pulse Rate: 110 (08/01 1900)  Labs: Recent Labs    06/28/23 1806 06/29/23 0005 06/29/23 0837  HGB 12.0*  --   --   HCT 37.3*  --   --   PLT 506*  --   --   APTT  --  44*  --   LABPROT 21.6*  --   --   INR 1.9*  --   --   HEPARINUNFRC  --   --  <0.10*  CREATININE 0.54*  --  0.50*  TROPONINIHS 14  --   --     Estimated Creatinine Clearance: 62.1 mL/min (A) (by C-G formula based on SCr of 0.5 mg/dL (L)).   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  Started on Tegretol.  No further episodes.    Medications:  Medications Prior to Admission  Medication Sig Dispense Refill Last Dose   albuterol (VENTOLIN HFA) 108 (90 Base) MCG/ACT inhaler Inhale 2 puffs into the lungs every 4 (four) hours as needed.   06/28/2023   carbamazepine (TEGRETOL) 200 MG tablet Take 1 tablet by mouth 3 (three) times daily.    06/28/2023   DULoxetine (CYMBALTA) 30 MG capsule Take 30 mg by mouth daily.   06/28/2023   feeding supplement (ENSURE ENLIVE / ENSURE PLUS) LIQD Take 237 mLs by mouth 3 (three) times daily between meals. 237 mL 12 06/28/2023   ipratropium-albuterol (DUONEB) 0.5-2.5 (3) MG/3ML SOLN Take 3 mLs by nebulization every 6 (six) hours as needed. 360 mL 3 06/28/2023   megestrol (MEGACE) 400 MG/10ML suspension Take 10 mLs (400 mg total) by mouth 2 (two) times daily. 240 mL 0 06/28/2023   Multiple Vitamin (MULTIVITAMIN WITH MINERALS) TABS tablet Take 1 tablet by mouth daily. 30 tablet 3 06/28/2023   TRELEGY ELLIPTA 100-62.5-25 MCG/ACT AEPB Inhale 1 puff into the lungs daily.   06/28/2023   warfarin (COUMADIN) 5 MG tablet Take 1 tablet (5 mg total) by mouth daily at 4 PM. 30 tablet 0 06/28/2023    Assessment: Pharmacy consulted to dose heparin and warfarin for DVT treatment in this 77 year old male admitted with sepsis, PNA.   Pt was on warfarin 5 mg PO daily PTA  Goal of Therapy:  HL :  0.3 -0.7  Monitor platelets by anticoagulation protocol: Yes   Plan: 2nd heparin level undetectable ---No bolus  per vascular surgery ---increase heparin infusion rate to 1350  units/hr ---Check anti-Xa level in 8 hours after rate change and at least once daily while on heparin ---Continue to monitor H&H and platelets  Otelia Sergeant, PharmD, [facility] 06/29/2023 8:08 PM

## 2023-06-29 NOTE — Consult Note (Addendum)
Consultation Note Date: 06/29/2023   Patient Name: Jorge Rosales  DOB: [birth year]  MRN: [id number]  Age / Sex: 77 y.o., male  PCP: [facility] Referring Physician: Delfino Lovett, MD  Reason for Consultation: Establishing goals of care  HPI/Patient Profile: History provided per chart review and bedside patient report. Patient was recently admitted 06/17/23-06/23/23 for treatment of a LUL pneumonia and was discharged to [facility] for rehab. Patient admits to continued symptoms post discharge including progressively worsening dyspnea and a persistent productive cough with brown sputum. He denies chest pain, palpitations, fever/chills, nausea/vomiting/ abdominal pain/ diarrhea, signs of bleeding, seizures or LOC. He does report new BLE edema over the past 2 days. He recently had a supra therapeutic INR of 12, but the patient reported that his Coumadin was never stopped completely just reduced. He has been on 3-4 L [place] since discharging to rehab. EMS reported initial SpO2 at 79% on 4 L [place] they administered 2 g Mg, 1 duo neb and 125 mg of solumedrol.  Clinical Assessment and Goals of Care: Notes and labs reviewed. In to see patient. He is sitting in bed, intermittently waking up and dozing back to sleep.  He appears uncomfortable, but does not appear to be in distress.  His oxygen saturation is 100%.   Patient's daughter is at bedside at this time, and states patient is sleeping as he is recently received medication to help him rest. She states she is a Engineer, civil (consulting) for [employer].  She states she used to work for hospice. She states her husband works for PT at  [facility].   States she is an only child, and patient is divorced.  Daughter states she is HPOA for patient. She states patient has 3 sisters.  She discusses at baseline fully independent living alone, and continuing to drive.  She discusses his blood clots in 2022.   She discusses his recent hospitalization for pneumonia.  She discusses his INR at that time.  She updates me about his status over the past week, and complaints of shortness of breath.   She has been well updated and is able to articulate patient's status and care plans.  She states patient has advanced directives.  She states she is unaware of his feelings on decisions or boundaries that may arise prior to those discussed in the AD. She states at this time he is asking for full code/full scope treatment.  Discussed following up tomorrow to talk further when hopefully patient will be able to engage in conversation.    SUMMARY OF RECOMMENDATIONS   PMT will follow-up tomorrow morning.     Primary Diagnoses: Present on Admission:  Left upper lobe pneumonia  Sepsis (HCC)  History of pulmonary embolism  COPD GOLD III  Acute respiratory failure with hypoxia (HCC)  Respiratory failure with hypoxia (HCC)   I have reviewed the medical record, interviewed the patient and family, and examined the patient. The following aspects are pertinent.  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  On long-term warfarin   Malignant neoplasm of skin    Osteoarthritis of both knees    Seizure disorder (HCC) 1982   He had a "drop attack clinical diagnosed with seizure disorder.  Started on Tegretol.  No further episodes.   Social History   Socioeconomic History   Marital status: Divorced    Spouse name: Not on file   Number of children: Not on file   Years of education: Not on file   Highest education level: Not on file  Occupational  History   Occupation: [employer]    Comment: part time  Tobacco Use   Smoking status: Former    Current packs/day: 1.00    Average packs/day: 1 pack/day for 58.6 years (58.6 ttl pk-yrs)    Types: Cigarettes    Start date: 11/28/1964   Smokeless tobacco: Not on file  Vaping Use   Vaping status: Never Used  Substance and Sexual Activity   Alcohol use: Yes    Comment: 2 beers/day   Drug use: Never   Sexual activity: Not on file  Other Topics Concern   Not on file  Social History Narrative   Diet:    Breakfast: Cereal, muffins, toast, eggs, bacon, 2 cups coffee   Late Lunch: Cheese and Bologna, Cheese and Ham   Dinner: Beef stew in the crock pot, Eats out a lot - [employer], Risk analyst: 3 glasses a day, sweet tea, coffee      Social: Engineer, water, Ride around, Animator, Read      Sleep: Sleeps in recliner at least 6 - 8 hrs a night - Does not use oxygen at night    Social Determinants of Health   Financial Resource Strain: Low Risk  (10/26/2022)   Received [facility]   Overall Financial Resource Strain (CARDIA)    Difficulty of Paying Living Expenses: Not very hard  Food Insecurity: No Food Insecurity (06/18/2023)   Hunger Vital Sign    Worried About Running Out of Food in the Last Year: Never true    Ran Out of Food in the Last Year: Never true  Transportation Needs: No Transportation Needs (06/18/2023)   PRAPARE - Administrator, Civil Service (Medical): No    Lack of Transportation (Non-Medical): No  Physical Activity: Not on file  Stress: Not on file  Social Connections: Not on file   Family History  Problem Relation Age of Onset   Heart disease Father    Lung cancer Father    Scheduled Meds:  budesonide (PULMICORT) nebulizer solution  0.25 mg Nebulization BID   Chlorhexidine Gluconate Cloth  6 each Topical Daily   docusate sodium  100 mg Oral BID   DULoxetine  30 mg Oral Daily    ipratropium-albuterol  3 mL Nebulization Once   ipratropium-albuterol  3 mL Nebulization TID AC   mouth rinse  15 mL Mouth Rinse 4 times per day   pantoprazole (PROTONIX) IV  40 mg Intravenous Q24H   sodium chloride flush  3 mL Intravenous Q12H   Warfarin -  Pharmacist Dosing Inpatient   Does not apply q1600   Continuous Infusions:  sodium chloride 10 mL/hr at 06/29/23 0800   heparin 1,150 Units/hr (06/29/23 1007)   lactated ringers 150 mL/hr at 06/29/23 1059   piperacillin-tazobactam (ZOSYN)  IV 3.375 g (06/29/23 0814)   vancomycin     PRN Meds:.acetaminophen **OR** acetaminophen, albuterol, bisacodyl, guaiFENesin, hydrALAZINE, LORazepam, morphine injection, ondansetron **OR** ondansetron (ZOFRAN) IV, mouth rinse, mouth rinse, polyethylene glycol Medications Prior to Admission:  Prior to Admission medications   Medication Sig Start Date End Date Taking? Authorizing Provider  albuterol (VENTOLIN HFA) 108 (90 Base) MCG/ACT inhaler Inhale 2 puffs into the lungs every 4 (four) hours as needed. 12/01/22  Yes [provider]  carbamazepine (TEGRETOL) 200 MG tablet Take 1 tablet by mouth 3 (three) times daily. 09/28/22  Yes [provider]  DULoxetine (CYMBALTA) 30 MG capsule Take 30 mg by mouth daily.   Yes [provider]  feeding supplement (ENSURE ENLIVE / ENSURE PLUS) LIQD Take 237 mLs by mouth 3 (three) times daily between meals. 06/23/23  Yes Marcelino Duster, MD  ipratropium-albuterol (DUONEB) 0.5-2.5 (3) MG/3ML SOLN Take 3 mLs by nebulization every 6 (six) hours as needed. 06/23/23  Yes Marcelino Duster, MD  megestrol (MEGACE) 400 MG/10ML suspension Take 10 mLs (400 mg total) by mouth 2 (two) times daily. 06/23/23  Yes Marcelino Duster, MD  Multiple Vitamin (MULTIVITAMIN WITH MINERALS) TABS tablet Take 1 tablet by mouth daily. 06/24/23  Yes Sreeram, Lynne Logan, MD  TRELEGY ELLIPTA 100-62.5-25 MCG/ACT AEPB Inhale 1 puff into the lungs daily. 01/03/23  Yes  [provider]  warfarin (COUMADIN) 5 MG tablet Take 1 tablet (5 mg total) by mouth daily at 4 PM. 06/23/23  Yes Marcelino Duster, MD   Allergies  Allergen Reactions   Wound Dressing Adhesive Rash    Rash with blisters; band-aids espescially. Per [facility] Clinical Summary   Latex Rash    Per [facility] Clinical Summary   Review of Systems  Respiratory:  Positive for shortness of breath.     Physical Exam Pulmonary:     Effort: Pulmonary effort is normal.  Neurological:     Mental Status: He is alert.     Vital Signs: BP 124/71   Pulse 93   Temp (!) 97.5 F (36.4 C) (Axillary)   Resp (!) 23   Ht 5\' 10"  (1.778 m)   Wt 55.9 kg   SpO2 100%   BMI 17.68 kg/m  Pain Scale: 0-10   Pain Score: 0-No pain   SpO2: SpO2: 100 % O2 Device:SpO2: 100 % O2 Flow Rate: .O2 Flow Rate (L/min): 8 L/min  IO: Intake/output summary:  Intake/Output Summary (Last 24 hours) at 06/29/2023 1221 Last data filed at 06/29/2023 0844 Gross per 24 hour  Intake 2300.18 ml  Output 625 ml  Net 1675.18 ml    LBM: Last BM Date :  (PTA) Baseline Weight: Weight: 58 kg Most recent weight: Weight: 55.9 kg       Signed by: Morton Stall, NP   Please contact Palliative Medicine Team phone at [phone] for questions and concerns.  For individual provider: See Loretha Stapler

## 2023-06-29 NOTE — Consult Note (Signed)
Hospital Consult    Reason for Consult:  Pulmonary Embolism  Requesting Physician:  Dr Irena Cords MD MRN #:  [id number]  History of Present Illness: This is a 77 y.o. male with a history of COPD recent pneumonia. Reviewed previous discharge summary from July 26. Patient also has a history of PE on Coumadin. Patient presents today for increasing shortness of breath and reports he never felt like he got better. He continued to have a cough with shortness of breath worsening over the last few days at rehab.   On exam the morning the patient was resting comfortably in bed in the ICU.  Patient's daughter is at the bedside.  Patient is noted to be on 6 L of nasal cannula oxygen.  Patient endorses that he must sit up at 60 degrees or higher otherwise he is unable to breathe.  Patient currently denies any chest pain or dizziness.  Patient's vitals are stable with a tachycardic heart rate.  Upon workup patient has noted to have right lung pulmonary embolisms as well as right lower extremity DVT.  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  Started on Tegretol.  No further episodes.    Past Surgical History:  Procedure Laterality Date   APPENDECTOMY     BIL hip replacement  11/29/2011   BOWEL RESECTION  11/28/1965   INGUINAL HERNIA REPAIR Bilateral     Allergies  Allergen Reactions   Wound Dressing Adhesive Rash    Rash with blisters; band-aids  espescially. Per [facility] Clinical Summary   Latex Rash    Per [facility] Clinical Summary    Prior to Admission medications   Medication Sig Start Date End Date Taking? Authorizing Provider  albuterol (VENTOLIN HFA) 108 (90 Base) MCG/ACT inhaler Inhale 2 puffs into the lungs every 4 (four) hours as needed. 12/01/22   [provider]  carbamazepine (TEGRETOL) 200 MG tablet Take 1 tablet by mouth 3 (three) times daily. 09/28/22   [provider]  cyanocobalamin (VITAMIN B12) 1000 MCG tablet Take 1,000 mcg by mouth daily. PRN Patient not taking: Reported on 06/17/2023 12/28/22 12/28/23  [provider]  DULoxetine (CYMBALTA) 30 MG capsule Take 30 mg by mouth daily.    [provider]  feeding supplement (ENSURE ENLIVE / ENSURE PLUS) LIQD Take 237 mLs by mouth 3 (three) times daily between meals. 06/23/23   Marcelino Duster, MD  ipratropium-albuterol (DUONEB) 0.5-2.5 (3) MG/3ML SOLN Take 3 mLs by nebulization every 6 (six) hours as needed. 06/23/23   Marcelino Duster, MD  megestrol (MEGACE) 400 MG/10ML suspension Take 10 mLs (400 mg total) by mouth 2 (two) times daily. 06/23/23   Marcelino Duster, MD  Multiple Vitamin (MULTIVITAMIN WITH MINERALS) TABS tablet Take 1 tablet by mouth daily. 06/24/23   Marcelino Duster, MD  TRELEGY ELLIPTA 100-62.5-25 MCG/ACT AEPB Inhale 1 puff into the lungs daily. 01/03/23   [provider]  warfarin (COUMADIN) 5 MG tablet Take 1 tablet (5 mg total) by mouth daily at 4 PM. 06/23/23   Marcelino Duster, MD    Social History   Socioeconomic History   Marital status: Divorced    Spouse name: Not on file   Number of children: Not on file   Years of education: Not on file   Highest education level: Not on file  Occupational History   Occupation: [employer]    Comment: part time  Tobacco Use   Smoking status: Former    Current packs/day: 1.00    Average packs/day: 1 pack/day for 58.6 years (58.6 ttl  pk-yrs)    Types: Cigarettes    Start date: 11/28/1964   Smokeless tobacco: Not on file  Vaping Use   Vaping status: Never Used  Substance and Sexual Activity   Alcohol use: Yes    Comment: 2 beers/day   Drug use: Never   Sexual activity: Not on file  Other Topics Concern   Not on file  Social History Narrative   Diet:    Breakfast: Cereal, muffins, toast, eggs, bacon, 2 cups coffee   Late Lunch: Cheese and Bologna, Cheese and Ham   Dinner: Beef stew in the crock pot, Eats out a lot - [employer], Risk analyst: 3 glasses a day, sweet tea, coffee      Social: Engineer, water, Ride around, Animator, Read      Sleep: Sleeps in recliner at least 6 - 8 hrs a night - Does not use oxygen at night    Social Determinants of Health   Financial Resource Strain: Low Risk  (10/26/2022)   Received from [facility]   Overall Financial Resource Strain (CARDIA)    Difficulty of Paying Living Expenses: Not very hard  Food Insecurity: No Food Insecurity (06/18/2023)   Hunger Vital Sign    Worried About Running Out of Food in the Last Year: Never true    Ran Out of Food in the Last Year: Never true  Transportation Needs: No Transportation Needs (06/18/2023)   PRAPARE - Administrator, Civil Service (Medical): No    Lack of Transportation (Non-Medical): No  Physical Activity: Not on file  Stress: Not on file  Social Connections: Not on file  Intimate Partner Violence: Not At Risk (06/18/2023)   Humiliation, Afraid, Rape, and Kick questionnaire    Fear of Current or Ex-Partner: No    Emotionally Abused: No    Physically Abused: No    Sexually Abused: No     Family History  Problem Relation Age of Onset   Heart disease Father    Lung cancer Father     ROS: Otherwise negative unless mentioned in HPI  Physical Examination  Vitals:   06/29/23 0729 06/29/23 0800  BP:    Pulse:  100  Resp:    Temp:  (!) 97.5 F (36.4 C)   SpO2: 98% 100%   Body mass index is 17.68 kg/m.  General:  WDWN in NAD Gait: Not observed HENT: WNL, normocephalic Pulmonary: labored breathing, with Rales, rhonchi,  and wheezing. Cardiac: regular, Tachycardia, without  Murmurs, rubs or gallops; without carotid bruits Abdomen: Positive bowel sounds,  soft, NT/ND, no masses Skin: without rashes Vascular Exam/Pulses: Palpable pulses throughout. Extremities: without ischemic changes, without Gangrene , without cellulitis; without open wounds;  Musculoskeletal: no muscle wasting or atrophy  Neurologic: A&O X 3;  No focal  weakness or paresthesias are detected; speech is fluent/normal Psychiatric:  The pt has Normal affect. Lymph:  Unremarkable  CBC    Component Value Date/Time   WBC 18.6 (H) 06/28/2023 1806   RBC 3.94 (L) 06/28/2023 1806   HGB 12.0 (L) 06/28/2023 1806   HCT 37.3 (L) 06/28/2023 1806   PLT 506 (H) 06/28/2023 1806   MCV 94.7 06/28/2023 1806   MCH 30.5 06/28/2023 1806   MCHC 32.2 06/28/2023 1806   RDW 12.7 06/28/2023 1806   LYMPHSABS 1.9 06/28/2023 1806   MONOABS 1.1 (H) 06/28/2023 1806   EOSABS 0.1 06/28/2023 1806   BASOSABS 0.1 06/28/2023 1806    BMET    Component Value Date/Time   NA 133 (L) 06/29/2023 0837   K 4.4 06/29/2023 0837   CL 98 06/29/2023 0837   CO2 25 06/29/2023 0837   GLUCOSE 127 (H) 06/29/2023 0837   BUN 12 06/29/2023 0837   CREATININE 0.50 (L) 06/29/2023 0837   CALCIUM 7.8 (L) 06/29/2023 0837   GFRNONAA >60 06/29/2023 0837    COAGS: Lab Results  Component Value Date   INR 1.9 (H) 06/28/2023   INR 1.7 (H) 06/23/2023   INR 2.6 (H) 06/22/2023     Non-Invasive Vascular Imaging:   EXAM:06/28/23 CT ANGIOGRAPHY CHEST WITH CONTRAST   TECHNIQUE: Multidetector CT imaging of the chest was performed using the standard protocol during bolus administration of intravenous contrast. Multiplanar CT image reconstructions and MIPs were obtained to evaluate the vascular anatomy.    RADIATION DOSE REDUCTION: This exam was performed according to the departmental dose-optimization program which includes automated exposure control, adjustment of the mA and/or kV according to patient size and/or use of iterative reconstruction technique.   CONTRAST:  75mL OMNIPAQUE IOHEXOL 350 MG/ML SOLN   COMPARISON:  CT angiogram chest 06/20/2023   FINDINGS: Cardiovascular: There is adequate opacification of the pulmonary arteries. Pulmonary emboli are seen within the distal right main pulmonary artery extending into segmental and subsegmental branches of the right upper lobe. There also segmental right lower lobe and right middle lobe pulmonary emboli.   Heart is normal in size. No evidence for aortic aneurysm. There are atherosclerotic calcifications of the aorta. There is no pericardial effusion.   Mediastinum/Nodes: No enlarged mediastinal, hilar, or axillary lymph nodes. Thyroid gland, trachea, and esophagus demonstrate no significant findings.   Lungs/Pleura: Large left upper lobe infiltrate with air bronchograms again seen. There is a new air-fluid level in the anterior apex measuring 3.5 x 4.5 by 2.0 cm. Focal spiculated density in the posterior left lower lobe appears unchanged. Lingular airspace disease has decreased, but there is some persistent ground-glass opacity in this region. Linear opacity with ground-glass in the right upper lobe appears unchanged. Emphysematous changes are again noted. No pleural effusion or pneumothorax.   Upper Abdomen: No acute findings. There is a right renal cyst measuring 2.1 cm.   Musculoskeletal: Multilevel degenerative changes affect the spine.   Review of the MIP images confirms the above findings.   IMPRESSION: 1. Acute pulmonary emboli in the distal right main pulmonary artery extending into segmental and subsegmental branches of the right upper lobe. Additional segmental and subsegmental pulmonary emboli in the right  lower lobe and right middle lobe. Positive for acute PE with CT evidence of right heart strain (RV/LV Ratio = 1.3) consistent with at least submassive (intermediate risk) PE. The presence of right heart strain has been associated with an increased risk of morbidity and mortality. 2. New air-fluid level in the  left apex worrisome for abscess. 3. Left upper lobe infiltrate otherwise appears unchanged. 4. Lingular airspace disease has decreased, but there is some persistent ground-glass opacity in this region. 5. Spiculated density in the left lower lobe is unchanged. 6. Stable ground-glass opacity in the right upper lobe.  EXAM: BILATERAL LOWER EXTREMITY VENOUS DOPPLER ULTRASOUND   TECHNIQUE: Gray-scale sonography with graded compression, as well as color Doppler and duplex ultrasound were performed to evaluate the lower extremity deep venous systems from the level of the common femoral vein and including the common femoral, femoral, profunda femoral, popliteal and calf veins including the posterior tibial, peroneal and gastrocnemius veins when visible. The superficial great saphenous vein was also interrogated. Spectral Doppler was utilized to evaluate flow at rest and with distal augmentation maneuvers in the common femoral, femoral and popliteal veins.   COMPARISON:  None Available.   FINDINGS: RIGHT LOWER EXTREMITY   Common Femoral Vein: No evidence of thrombus. Normal compressibility, respiratory phasicity and response to augmentation.   Saphenofemoral Junction: No evidence of thrombus. Normal compressibility and flow on color Doppler imaging.   Profunda Femoral Vein: No evidence of thrombus. Normal compressibility and flow on color Doppler imaging.   Femoral Vein: No evidence of thrombus. Normal compressibility, respiratory phasicity and response to augmentation.   Popliteal Vein: Nonocclusive, mildly expansile, thrombus is present. The thrombus is mostly hypoechoic  with a few areas of heterogeneously hyperechoic linear morphology.   Calf Veins: Occlusive, hypoechoic, minimally expansile thrombus throughout.   Other Findings: Mostly simple appearing hypoechoic structure in the popliteal fossa measuring proximally 2.1 x 1.3 x 1.1 cm.   LEFT LOWER EXTREMITY   Common Femoral Vein: No evidence of thrombus. Normal compressibility, respiratory phasicity and response to augmentation.   Saphenofemoral Junction: No evidence of thrombus. Normal compressibility and flow on color Doppler imaging.   Profunda Femoral Vein: No evidence of thrombus. Normal compressibility and flow on color Doppler imaging.   Femoral Vein: There is mildly expansile, heterogeneously isoechoic, occlusive thrombus in the central segment of the femoral vein. The peripheral femoral vein is patent.   Popliteal Vein: Patent centrally with occlusive, mildly expansile, heterogeneously isoechoic thrombus through the peripheral popliteal extending into the calf veins.   Calf Veins: Occlusive, heterogeneously isoechoic thrombus throughout.   Other Findings:  None.   IMPRESSION: 1. Subacute appearing right lower extremity deep vein thrombosis limited to the popliteal and calf veins. 2. Subacute appearing left lower extremity deep vein thrombosis in the central left femoral vein in addition to the peripheral popliteal vein extending into the calf veins. 3. No evidence of iliac vein extension. 4. Right simple appearing Baker cyst measuring up to 2.1 cm.   Statin:  No. Beta Blocker:  No. Aspirin:  No. ACEI:  No. ARB:  No. CCB use:  No Other antiplatelets/anticoagulants:  Yes.   Coumadin 5 mg daily.    ASSESSMENT/PLAN: This is a 77 y.o. male who presents to [facility] emergency department with increasing shortness of breath that has been worsening over the last few days while at rehab.  Upon workup patient was found to have right lobe pulmonary embolisms as well as right lower  extremity DVTs.  Vascular Surgery plans on taking the patient to the vascular lab later today for a Pulmonary Thrombectomy and Inferior Vena cava Filter placement.  Pulmonary critical care would like to treat the patient's shortness of breath by treating his known pneumonia prior to any procedure for pulmonary embolism and DVT.  Patient is currently on IV  antibiotics and IV heparin for the noted thrombosis.  Echocardiogram will be ordered to determine if patient's right heart strain is significant.  -I discussed the plan in detail with Dr. Elbert Ewings MD and he agrees with the plan.   Marcie Bal [facility] 06/29/2023 9:23 AM

## 2023-06-29 NOTE — Plan of Care (Signed)
  Problem: Clinical Measurements: Goal: Respiratory complications will improve 06/29/2023 1708 by Jorge Rosales, Jorge Copa, RN Outcome: Progressing 06/29/2023 1706 by Jorge Rosales, Claretta Kendra, RN Outcome: Progressing   Problem: Coping: Goal: Level of anxiety will decrease Outcome: Progressing   Problem: Elimination: Goal: Will not experience complications related to bowel motility Outcome: Progressing Goal: Will not experience complications related to urinary retention 06/29/2023 1708 by Jorge Rosales, Jorge Copa, RN Outcome: Progressing 06/29/2023 1706 by Jorge Rosales, Jorge Copa, RN Outcome: Progressing   Problem: Pain Managment: Goal: General experience of comfort will improve 06/29/2023 1708 by Jorge Rosales, Jorge Copa, RN Outcome: Progressing 06/29/2023 1706 by Jorge Rosales, Jorge Copa, RN Outcome: Progressing   Problem: Safety: Goal: Ability to remain free from injury will improve 06/29/2023 1708 by Jorge Rosales, Jorge Copa, RN Outcome: Progressing 06/29/2023 1706 by Jorge Rosales, Jorge Copa, RN Outcome: Progressing   Problem: Skin Integrity: Goal: Risk for impaired skin integrity will decrease 06/29/2023 1708 by Jorge Rosales, Jorge Copa, RN Outcome: Progressing 06/29/2023 1706 by Jorge Rosales, Jorge Copa, RN Outcome: Progressing

## 2023-06-29 DEATH — deceased

## 2023-06-30 ENCOUNTER — Inpatient Hospital Stay: Payer: Medicare Other

## 2023-06-30 ENCOUNTER — Encounter: Admission: EM | Disposition: E | Payer: Self-pay | Source: Home / Self Care | Attending: Internal Medicine

## 2023-06-30 DIAGNOSIS — J9601 Acute respiratory failure with hypoxia: Secondary | ICD-10-CM | POA: Diagnosis not present

## 2023-06-30 DIAGNOSIS — J440 Chronic obstructive pulmonary disease with acute lower respiratory infection: Secondary | ICD-10-CM

## 2023-06-30 DIAGNOSIS — I825Z1 Chronic embolism and thrombosis of unspecified deep veins of right distal lower extremity: Secondary | ICD-10-CM

## 2023-06-30 DIAGNOSIS — J9621 Acute and chronic respiratory failure with hypoxia: Secondary | ICD-10-CM | POA: Diagnosis not present

## 2023-06-30 DIAGNOSIS — I50810 Right heart failure, unspecified: Secondary | ICD-10-CM

## 2023-06-30 DIAGNOSIS — J851 Abscess of lung with pneumonia: Secondary | ICD-10-CM

## 2023-06-30 DIAGNOSIS — I2699 Other pulmonary embolism without acute cor pulmonale: Secondary | ICD-10-CM | POA: Diagnosis not present

## 2023-06-30 DIAGNOSIS — Z86711 Personal history of pulmonary embolism: Secondary | ICD-10-CM | POA: Diagnosis not present

## 2023-06-30 DIAGNOSIS — Z7189 Other specified counseling: Secondary | ICD-10-CM | POA: Diagnosis not present

## 2023-06-30 HISTORY — PX: IVC FILTER INSERTION: CATH118245

## 2023-06-30 HISTORY — PX: PULMONARY THROMBECTOMY: CATH118295

## 2023-06-30 LAB — BLOOD GAS, ARTERIAL
Acid-Base Excess: 2.2 mmol/L — ABNORMAL HIGH (ref 0.0–2.0)
Bicarbonate: 28.3 mmol/L — ABNORMAL HIGH (ref 20.0–28.0)
O2 Saturation: 97.6 %
Patient temperature: 37
pCO2 arterial: 49 mmHg — ABNORMAL HIGH (ref 32–48)
pH, Arterial: 7.37 (ref 7.35–7.45)
pO2, Arterial: 91 mmHg (ref 83–108)

## 2023-06-30 LAB — TYPE AND SCREEN
ABO/RH(D): A POS
Antibody Screen: NEGATIVE

## 2023-06-30 LAB — HEPARIN LEVEL (UNFRACTIONATED): Heparin Unfractionated: 0.14 IU/mL — ABNORMAL LOW (ref 0.30–0.70)

## 2023-06-30 SURGERY — PULMONARY THROMBECTOMY
Anesthesia: Moderate Sedation

## 2023-06-30 SURGERY — PULMONARY THROMBECTOMY
Anesthesia: Moderate Sedation | Laterality: Right

## 2023-06-30 MED ORDER — FENTANYL CITRATE PF 50 MCG/ML IJ SOSY
PREFILLED_SYRINGE | INTRAMUSCULAR | Status: AC
  Filled 2023-06-30: qty 1

## 2023-06-30 MED ORDER — SODIUM CHLORIDE 0.9 % IV SOLN: INTRAVENOUS | Status: DC

## 2023-06-30 MED ORDER — MIDAZOLAM HCL 2 MG/ML PO SYRP: 8.0000 mg | ORAL_SOLUTION | Freq: Once | ORAL | Status: DC | PRN

## 2023-06-30 MED ORDER — MIDAZOLAM HCL 2 MG/2ML IJ SOLN
INTRAMUSCULAR | Status: AC
  Filled 2023-06-30: qty 2

## 2023-06-30 MED ORDER — MORPHINE SULFATE (PF) 2 MG/ML IV SOLN
1.0000 mg | INTRAVENOUS | Status: DC | PRN
  Administered 2023-06-30 – 2023-07-02 (×13): 1 mg via INTRAVENOUS
  Filled 2023-06-30 (×14): qty 1

## 2023-06-30 MED ORDER — HEPARIN SODIUM (PORCINE) 1000 UNIT/ML IJ SOLN
INTRAMUSCULAR | Status: DC | PRN
  Administered 2023-06-30: 3000 [IU] via INTRAVENOUS

## 2023-06-30 MED ORDER — MIDAZOLAM HCL 2 MG/2ML IJ SOLN
INTRAMUSCULAR | Status: DC | PRN
  Administered 2023-06-30 (×3): .5 mg via INTRAVENOUS
  Administered 2023-06-30: 1 mg via INTRAVENOUS

## 2023-06-30 MED ORDER — LORAZEPAM 2 MG/ML IJ SOLN
1.0000 mg | INTRAMUSCULAR | Status: DC | PRN
  Administered 2023-06-30 – 2023-07-02 (×5): 1 mg via INTRAVENOUS
  Filled 2023-06-30 (×5): qty 1

## 2023-06-30 MED ORDER — IODIXANOL 320 MG/ML IV SOLN
INTRAVENOUS | Status: DC | PRN
  Administered 2023-06-30: 50 mL via INTRAVENOUS

## 2023-06-30 MED ORDER — MORPHINE SULFATE (PF) 2 MG/ML IV SOLN
1.0000 mg | Freq: Four times a day (QID) | INTRAVENOUS | Status: DC | PRN
  Administered 2023-06-30: 1 mg via INTRAVENOUS

## 2023-06-30 MED ORDER — HEPARIN (PORCINE) IN NACL 1000-0.9 UT/500ML-% IV SOLN
INTRAVENOUS | Status: DC | PRN
  Administered 2023-06-30: 1000 mL

## 2023-06-30 MED ORDER — DIPHENHYDRAMINE HCL 50 MG/ML IJ SOLN: 50.0000 mg | Freq: Once | INTRAMUSCULAR | Status: DC | PRN

## 2023-06-30 MED ORDER — FAMOTIDINE 20 MG PO TABS: 40.0000 mg | ORAL_TABLET | Freq: Once | ORAL | Status: DC | PRN

## 2023-06-30 MED ORDER — HEPARIN (PORCINE) 25000 UT/250ML-% IV SOLN
1850.0000 [IU]/h | INTRAVENOUS | Status: DC
  Administered 2023-06-30: 1550 [IU]/h via INTRAVENOUS
  Administered 2023-07-01 – 2023-07-02 (×2): 1750 [IU]/h via INTRAVENOUS
  Administered 2023-07-02: 1850 [IU]/h via INTRAVENOUS
  Filled 2023-06-30 (×4): qty 250

## 2023-06-30 MED ORDER — HYDROMORPHONE HCL 1 MG/ML IJ SOLN
1.0000 mg | Freq: Once | INTRAMUSCULAR | Status: AC | PRN
  Administered 2023-06-30: 1 mg via INTRAVENOUS
  Filled 2023-06-30: qty 1

## 2023-06-30 MED ORDER — CEFAZOLIN SODIUM-DEXTROSE 2-4 GM/100ML-% IV SOLN: 2.0000 g | INTRAVENOUS | Status: DC

## 2023-06-30 MED ORDER — METHYLPREDNISOLONE SODIUM SUCC 125 MG IJ SOLR: 125.0000 mg | Freq: Once | INTRAMUSCULAR | Status: DC | PRN

## 2023-06-30 MED ORDER — HEPARIN SODIUM (PORCINE) 1000 UNIT/ML IJ SOLN
INTRAMUSCULAR | Status: AC
  Filled 2023-06-30: qty 10

## 2023-06-30 MED ORDER — LIDOCAINE HCL (PF) 1 % IJ SOLN
INTRAMUSCULAR | Status: DC | PRN
  Administered 2023-06-30: 10 mL via INTRADERMAL

## 2023-06-30 MED ORDER — FENTANYL CITRATE PF 50 MCG/ML IJ SOSY: 12.5000 ug | PREFILLED_SYRINGE | Freq: Once | INTRAMUSCULAR | Status: DC | PRN

## 2023-06-30 MED ORDER — ONDANSETRON HCL 4 MG/2ML IJ SOLN: 4.0000 mg | Freq: Four times a day (QID) | INTRAMUSCULAR | Status: DC | PRN

## 2023-06-30 SURGICAL SUPPLY — 24 items
CANISTER PENUMBRA ENGINE (MISCELLANEOUS) IMPLANT
CATH ANGIO 5F PIGTAIL 100CM (CATHETERS) IMPLANT
CATH INDIGO SEP 8 (CATHETERS) IMPLANT
CATH INFINITI JR4 5F (CATHETERS) IMPLANT
CATH LIGHTNING 8 XTORQ 115 (CATHETERS) IMPLANT
CATH SELECT BERN TIP 5F 130 (CATHETERS) IMPLANT
CLOSURE PERCLOSE PROSTYLE (VASCULAR PRODUCTS) IMPLANT
COVER EZ STRL 42X30 (DRAPES) IMPLANT
COVER PROBE U/S 5X48 (MISCELLANEOUS) IMPLANT
DEVICE TORQUE .025-.038 (MISCELLANEOUS) IMPLANT
GLIDEWIRE ANGLED SS 035X260CM (WIRE) IMPLANT
KIT FEM OPTION ELITE FILTER (Filter) IMPLANT
NDL ENTRY 21GA 7CM ECHOTIP (NEEDLE) IMPLANT
NEEDLE ENTRY 21GA 7CM ECHOTIP (NEEDLE) ×2 IMPLANT
PACK ANGIOGRAPHY (CUSTOM PROCEDURE TRAY) ×2 IMPLANT
SET INTRO CAPELLA COAXIAL (SET/KITS/TRAYS/PACK) IMPLANT
SHEATH 9FRX11 (SHEATH) IMPLANT
SHEATH BRITE TIP 6FRX11 (SHEATH) IMPLANT
SUT MNCRL AB 4-0 PS2 18 (SUTURE) IMPLANT
SUT PROLENE 0 CT 1 30 (SUTURE) IMPLANT
SYR MEDRAD MARK 7 150ML (SYRINGE) IMPLANT
TUBING CONTRAST HIGH PRESS 72 (TUBING) IMPLANT
WIRE AMPLATZ SSTIFF .035X260CM (WIRE) IMPLANT
WIRE GUIDERIGHT .035X150 (WIRE) IMPLANT

## 2023-06-30 NOTE — Interval H&P Note (Signed)
History and Physical Interval Note:  07/22/2023 9:09 AM  Jorge Rosales  has presented today for surgery, with the diagnosis of Pulmonary embolism.  The various methods of treatment have been discussed with the patient and family. After consideration of risks, benefits and other options for treatment, the patient has consented to  Procedure(s): PULMONARY THROMBECTOMY (Bilateral) IVC FILTER INSERTION (N/A) as a surgical intervention.  The patient's history has been reviewed, patient examined, no change in status, stable for surgery.  I have reviewed the patient's chart and labs.  Questions were answered to the patient's satisfaction.     Levora Dredge

## 2023-06-30 NOTE — Progress Notes (Signed)
Daily Progress Note   Patient Name: Jorge Rosales       Date: 07/18/2023 DOB: Apr 20, 1946  Age: 77 y.o. MRN#: [id number] Attending Physician: Delfino Lovett, MD Primary Care Physician: [facility] Admit Date: 06/15/2023  Reason for Consultation/Follow-up: Establishing goals of care  Subjective: Notes and labs reviewed.  Patient underwent thrombectomy and IVC filter placement earlier today.  Patient complains of shortness of breath and has been intermittently on BiPAP.  His daughter is at bedside.  Daughter has brought advanced directives which were scanned into ACP tab.  She is listed as          H POA.  His living will discusses a natural death.   We discussed his diagnoses and status, and wishes moving forward in regards to ventilator support and CODE STATUS.  He states he would never want to be put on a machine to breathe for him or to have life support.  He states he would not want resuscitative efforts if he was dying; he states if it is God's will to take you, then you will go.  He asked his daughter her opinion on his decisions.  She advises that it is hard, but she wants him to make decisions for himself, and that she would support his decisions.  With this, he stated he wanted to do anything and everything to live as long as he could.  He states he wants to be here for his daughter.  Daughter states she will talk to her father further regarding his wishes on care moving forward.  Daughter states her husband went to work and she is alone here at the hospital.  Discussed spiritual care.  She states she would like to speak with a pastor.  Community area pastor called and spoke with her regarding arrangements to meet today to talk further.  Length of Stay: 2  Current  Medications: Scheduled Meds:   budesonide (PULMICORT) nebulizer solution  0.25 mg Nebulization BID   Chlorhexidine Gluconate Cloth  6 each Topical Daily   docusate sodium  100 mg Oral BID   DULoxetine  30 mg Oral Daily   ipratropium-albuterol  3 mL Nebulization Once   ipratropium-albuterol  3 mL Nebulization TID AC   mouth rinse  15 mL Mouth Rinse 4 times per day   pantoprazole (PROTONIX)  IV  40 mg Intravenous Q24H   sodium chloride flush  3 mL Intravenous Q12H    Continuous Infusions:  heparin 1,550 Units/hr (07/08/2023 1225)   piperacillin-tazobactam (ZOSYN)  IV Stopped (07/16/2023 0911)    PRN Meds: acetaminophen **OR** acetaminophen, albuterol, bisacodyl, fentaNYL (SUBLIMAZE) injection, guaiFENesin, hydrALAZINE, HYDROmorphone (DILAUDID) injection, LORazepam, morphine injection, ondansetron **OR** ondansetron (ZOFRAN) IV, mouth rinse, mouth rinse, polyethylene glycol  Physical Exam Pulmonary:     Comments: Work of breathing noted.  Congested breathing noted audibly at bedside Neurological:     Mental Status: He is alert.             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) 06/29/2023   Acute respiratory failure with hypoxia (HCC) 06/06/2023   Respiratory failure with hypoxia (HCC) 06/09/2023   Protein-calorie malnutrition, severe 06/22/2023   Debility  06/21/2023   Hypokalemia 06/21/2023   Supratherapeutic INR 06/18/2023   Hyponatremia 06/18/2023   History of seizures 06/18/2023   History of pulmonary embolism 06/17/2023   Warfarin anticoagulation 06/17/2023   Left upper lobe pneumonia 06/17/2023   Sepsis (HCC) 06/17/2023   CAP (community acquired pneumonia) 06/17/2023   Bradycardia by electrocardiogram 01/23/2023   Pulmonary hypertension, unspecified (HCC) 01/23/2023   Nonspecific abnormal electrocardiogram (ECG) (EKG) 01/23/2023   Acute pulmonary embolism (HCC) 05/16/2021   Pulmonary embolism (HCC) 05/16/2021   Pulmonary cavitary lesion 04/11/2014   Smoker 01/23/2014   Solitary pulmonary nodule 01/23/2014   COPD GOLD III 01/22/2014   Seizure disorder (HCC) 06/08/2009    Palliative Care Assessment & Plan    Recommendations/Plan: Patient and daughter discussing DNR/DNI status.  Code Status:    Code Status Orders  (From admission, onward)           Start     Ordered   06/23/2023 2335  Full code  Continuous       Question:  By:  Answer:  Other   06/21/2023 2334           Code Status History     Date Active Date Inactive Code Status Order ID Comments User Context   05/29/2023 2233 06/04/2023 2334 Full Code [id number]  Gertha Calkin, MD ED   06/17/2023 2246 06/23/2023 2024 Full Code [id number]  Andris Baumann, MD ED   05/16/2021 0600 05/19/2021 1826 Full Code [id number]  Staci Acosta, MD Inpatient       Prognosis: Poor overall   Care plan was discussed with attending and nurse via epic chat  Thank you for allowing the Palliative Medicine Team to assist in the care of this patient.  Morton Stall, NP  Please contact Palliative Medicine Team phone at [phone] for questions and concerns.

## 2023-06-30 NOTE — Progress Notes (Signed)
I had a family meeting with the patient and his daughter as well as his son-in-law.  At the time of the meeting the patient is now on 8 L of oxygen with saturations ranging from 89-91 during the discussion.  He is visibly tachypneic with mild to moderate distress.  The discussion centered on the pros and cons as well as the risks and benefits of pulmonary thrombectomy.  Given the extensive pulmonary disease and resulting pulmonary dysfunction on the left side he is relying primarily on his right lung for oxygenation as well as ventilation.  Review of the CT scan does show near occlusive thrombus in the lobar branches particularly the right lower and right middle.  This is more proximal than the subsegmental and segmental branches that are mentioned in the report although thrombus is present at these locations as well.  Given his overall clinical condition his air hunger and the capability for thrombectomy using penumbra CAT 8 device with low risk the family and I discussed this procedure in detail as well as the option of just continuing anticoagulation.  The patient and the family feel very strongly that they would like to attempt pulmonary thrombectomy.  With respect to IVC C filter I also discussed with patient and family that without the knowledge that the Tegretol would be stopped and that he would be switched over to a novel anticoagulant such as Eliquis that a filter was absolutely indicated.  There is absolutely no doubt that a patient who has a negative CT scan a month or so ago and now presents with multiple pulmonary emboli and yet on presentation his INR is essentially 2 that this represents a Coumadin failure.  This is the strongest indication for an IVC filter and this was the basis upon which it made my recommendation.  Having said that it is not unreasonable given his profound pulmonary compromise and his lack of any pulmonary reserve given that he has had propagation of thrombus while on  anticoagulation that a filter be placed for these indications as well.  I presented all of this data including the fact that since he is tolerating heparin and will likely be transition to Eliquis a filter is not absolute.  Nevertheless the family wishes that a filter be placed.  Given his age and the fact that he will continue anticoagulation of some form the risk of a filter complication is certainly extraordinarily small.  Based on this I will place a filter at the time of the thrombectomy.

## 2023-06-30 NOTE — Progress Notes (Signed)
[facility]   PATIENT NAME: Jorge Rosales    MR#:  [id number]  PCP: [facility]  DATE OF BIRTH:  [birth year]  SUBJECTIVE:  CHIEF COMPLAINT:   Chief Complaint  Patient presents with   Shortness of Breath  Short of breath REVIEW OF SYSTEMS:  Review of Systems  Constitutional:  Positive for malaise/fatigue and weight loss.  Respiratory:  Positive for shortness of breath.   All other systems reviewed and are negative.  DRUG ALLERGIES:   Allergies  Allergen Reactions   Wound Dressing Adhesive Rash    Rash with blisters; band-aids espescially. Per [facility] Clinical Summary   Latex Rash    Per [facility] Clinical Summary   VITALS:  Blood pressure 115/70, pulse 99, temperature 97.6 F (36.4 C), temperature source Axillary, resp. rate (!) 21, height 5\' 10"  (1.778 m), weight 55.9 kg, SpO2 100%. PHYSICAL EXAMINATION:  Physical Exam Vitals and nursing note reviewed.  Constitutional:      Appearance: He is ill-appearing.  HENT:     Head: Normocephalic and atraumatic.  Eyes:     Extraocular Movements: Extraocular movements intact.     Pupils: Pupils are equal, round, and reactive to light.  Cardiovascular:     Rate and Rhythm: Regular rhythm. Tachycardia present.  Pulmonary:     Effort: Tachypnea and respiratory distress present.     Breath sounds: Examination of the right-lower field reveals decreased breath sounds and rhonchi. Examination of the left-lower field reveals decreased breath sounds and rhonchi. Decreased breath sounds and rhonchi present.  Abdominal:     General: Bowel sounds are normal.     Palpations: Abdomen is soft.  Musculoskeletal:        General: Normal range of motion.     Cervical back: Normal range of motion and neck supple.  Skin:    General: Skin is warm and dry.  Neurological:     General: No focal deficit present.     Mental Status: He is alert and oriented to person, place, and time.  Psychiatric:        Mood and  Affect: Mood normal.        Behavior: Behavior normal.    LABORATORY PANEL:  Male CBC Recent Labs  Lab 07/02/2023 0448  WBC 23.5*  HGB 11.6*  HCT 35.9*  PLT 624*   ------------------------------------------------------------------------------------------------------------------ Chemistries  Recent Labs  Lab 06/12/2023 1806 06/29/23 0837 07/03/2023 0448  NA 130*   < > 138  K 3.9   < > 3.9  CL 96*   < > 102  CO2 28   < > 28  GLUCOSE 187*   < > 98  BUN 16   < > 12  CREATININE 0.54*   < > 0.52*  CALCIUM 7.7*   < > 7.9*  MG  --   --  2.4  AST 20  --   --   ALT 17  --   --   ALKPHOS 125  --   --   BILITOT 0.4  --   --    < > = values in this interval not displayed.   MEDICATIONS:  Scheduled Meds:  budesonide (PULMICORT) nebulizer solution  0.25 mg Nebulization BID   Chlorhexidine Gluconate Cloth  6 each Topical Daily   docusate sodium  100 mg Oral BID   DULoxetine  30 mg Oral Daily   ipratropium-albuterol  3 mL Nebulization Once   ipratropium-albuterol  3 mL Nebulization TID AC  mouth rinse  15 mL Mouth Rinse 4 times per day   pantoprazole (PROTONIX) IV  40 mg Intravenous Q24H   sodium chloride flush  3 mL Intravenous Q12H   Continuous Infusions:  heparin 1,550 Units/hr (07/19/2023 1225)   piperacillin-tazobactam (ZOSYN)  IV Stopped (07/03/2023 0911)   RADIOLOGY:  PERIPHERAL VASCULAR CATHETERIZATION  Result Date: 07/24/2023 See surgical note for result.  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  Discontinue vancomycin as MRSA negative Appreciate ID input.  Checking AFB sputum to rule out atypical mycobacteria/TB.  Continue airborne isolation Continue nebs and bronchodilators  Acute submassive PE Subacute DVT in bilateral lower extremity Status post bilateral pulmonary thrombectomy and IVC filter insertion by vascular surgery Continue heparin drip Echo shows normal LV systolic function with a EF of 60 to 65%.  Mildly elevated pulmonary artery systolic pressure Will consider transition to DOAC instead of Coumadin when transition from heparin  Unintentional weight loss 40 pounds in last 6 months Patient was functional and driving before 6 1 but has had significant clinical and functional decline in last few months especially since last hospitalization.  Palliative care consult for goals of care discussion Checking AFB sputum per ID  History of seizure He had been on carbamazepine since 80s and had not seen neurology or not reported any further seizure Will stop carbamazepine at this time as it does have significant interaction with blood thinners  Goals of care Palliative care consult.  Overall poor prognosis I have added low-dose morphine for air hunger per patient/family request  Body mass index is 17.68 kg/m.  Net IO Since Admission: 1,642.41 mL [07/08/2023 1514]      LOS: 2 days   Consultants: ID Vascular surgery Intensivist Palliative care    Antibiotics: IV Zosyn  Status is: Inpatient 3-4   DVT prophylaxis:   Heparin drip     He remains critically sick with high risk for multiorgan failure, acute cardiorespiratory failure and death.  Monitor in stepdown for now    Family Communication: Updated daughter at bedside   All the records are reviewed and case discussed with Nursing and [facility] team. Management plans discussed with the patient, family and they are in agreement.  CODE STATUS:  Full Code Level of care: Stepdown  TOTAL TIME TAKING CARE OF THIS PATIENT: 35 minutes.   More than 50% of the time was spent in counseling/coordination of care: YES  POSSIBLE D/C IN 3-4 DAYS, DEPENDING ON CLINICAL CONDITION.   Delfino Lovett M.D on 07/09/2023 at 3:14 PM  [facility]   CC: Primary care physician; [facility]  Note: This dictation was prepared with Dragon dictation along with smaller phrase technology. Any transcriptional errors that result from this process are unintentional.

## 2023-06-30 NOTE — Op Note (Signed)
[facility]  Percutaneous Study/Intervention Procedural Note   Date of Surgery: 06/30/2023,6:45 PM  Surgeon:Teria Khachatryan, Latina Craver   Pre-operative Diagnosis: Symptomatic pulmonary emboli with right heart strain and hypoxia  Post-operative diagnosis:  Same  Procedure(s) Performed:  1.  Contrast injection right heart and bilateral pulmonary arteries  2.  Selective catheter placement right upper lobe pulmonary artery, middle lobe pulmonary artery and lower lobe pulmonary artery  3.  Mechanical thrombectomy right lobar pulmonary arteries for removal of pulmonary emboli using the Penumbra CAT 8 thrombectomy catheter.  4.  Placement of an infrarenal inferior vena caval IVC filter  5.  Inferior venacavogram    Anesthesia: Conscious sedation was administered under my direct supervision by the interventional radiology RN. IV Versed plus fentanyl were utilized. Continuous ECG, pulse oximetry and blood pressure was monitored throughout the entire procedure.  Versed and fentanyl were administered intravenously.  Conscious sedation was administered for a total of approximately 60 minutes.  Sheath: 9 French 11 cm Pinnacle sheath antegrade right common femoral vein  Contrast: 50 cc   Fluoroscopy Time: 17 minutes  Indications:  Patient presents with pulmonary emboli. The patient is symptomatic with hypoxemia and dyspnea on exertion.  There is evidence of right heart strain on the CT angiogram. The patient is otherwise a good candidate for intervention and even the long-term benefits pulmonary angiography with thrombolysis is offered. The risks and benefits are reviewed long-term benefits are discussed. All questions are answered patient agrees to proceed.  Procedure:  Jorge Rosales a 77 y.o. male who was identified and appropriate procedural time out was performed.  The patient was then placed supine on the table and prepped and draped in the usual sterile fashion.  Ultrasound was  used to evaluate the right common femoral vein.  It was patent, as it was echolucent and compressible.  A digital ultrasound image was acquired for the permanent record.  A micropuncture needle was used to access the right common femoral vein under direct ultrasound guidance.  A microwire was then advanced under fluoroscopic guidance followed by micro-sheath.  A 0.035 J wire was advanced without resistance and a 5Fr sheath was placed.  Perclose devices were then used in a preclose fashion and then upsized to an 9 [place] sheath.    The wire and pigtail catheter were then negotiated into the right atrium and bolus injection of contrast was utilized to demonstrate the right ventricle and the pulmonary artery outflow.   3000 units of heparin was then given and allowed to circulate.  The J-wire and pigtail catheter was advanced up to the right atrium where a bolus injection contrast was used to demonstrate the pulmonary artery outflow.  Stiff angled Glidewire was then exchanged for the J-wire and the pigtail catheter was used to select the pulmonary outflow track.  The right main pulmonary artery was then selected.  A select catheter was then advanced over the Amplatz wire into the distal right main pulmonary artery and hand-injection confirmed the thrombus.  The Penumbra Cat 8 extra torque catheter was then advanced into the thrombus in the right lower lobe pulmonary artery.  Hand-injection contrast was used to verify positioning and evaluate the distal anatomy.  Mechanical aspiration was performed using the CAT 8 catheter and a separator.  After multiple passes the catheter was then repositioned into the right middle lobe pulmonary artery and again hand-injection contrast was performed to verify position and evaluate the distal anatomy.  Multiple passes were made using mechanical  aspiration in association with a separator.  Lastly, using a combination of the separator catheter and Glidewire the CAT 8  device was negotiated into the right upper lobe pulmonary artery.  Hand-injection of contrast was used to verify positioning and evaluate the distal anatomy.  Multiple passes were then performed using the separator with the CAT 8 penumbra catheter.  Once there was free flow of blood from all 3 lobar arteries the catheter was repositioned to the right main pulmonary artery.  Hand-injection contrast was then used to give an assessment of the effectiveness of thrombectomy.  The detector was then repositioned over the abdomen and the delivery sheath for option Elite filter is then advanced over the J-wire.  His position with its tip at the L5 level and bolus injection contrast is used to demonstrate the inferior vena cava.  Inferior vena cava measures 18 mm in diameter with reflux of contrast into the bilateral renal veins.  The filter is deployed at the L2-L3 level just below the renal veins and in excellent orientation.  After review these images the catheter and sheath were removed and the Perclose devices secured.  Pressure is then held and a pressure dressing is applied. There were no immediate complications.    Findings:   Right heart imaging:  Right atrium and right ventricle and the pulmonary outflow tract appears normal  Right lung: The initial images of the right lung demonstrate thrombus within the distal right main pulmonary artery extending into the right upper, middle and lower lobar arteries.  There is thrombus extending into the segmental branches as well.  Following thrombectomy there appears to be near total resolution of the previously identified thrombus.   IVC filter is deployed at the L2-L3 level in excellent orientation.  Vena cava is normal on imaging.   Disposition: Patient was taken to the recovery room in stable condition having tolerated the procedure well.  Jorge Rosales 06/30/2023,6:45 PM

## 2023-06-30 NOTE — Plan of Care (Signed)
  Problem: Fluid Volume: Goal: Hemodynamic stability will improve Outcome: Progressing   Problem: Clinical Measurements: Goal: Diagnostic test results will improve Outcome: Progressing Goal: Signs and symptoms of infection will decrease Outcome: Progressing   Problem: Respiratory: Goal: Ability to maintain adequate ventilation will improve Outcome: Progressing   Problem: Activity: Goal: Ability to tolerate increased activity will improve Outcome: Progressing   Problem: Clinical Measurements: Goal: Ability to maintain a body temperature in the normal range will improve Outcome: Progressing   Problem: Respiratory: Goal: Ability to maintain adequate ventilation will improve Outcome: Progressing Goal: Ability to maintain a clear airway will improve Outcome: Progressing   Problem: Education: Goal: Knowledge of General Education information will improve Description: Including pain rating scale, medication(s)/side effects and non-pharmacologic comfort measures Outcome: Progressing   Problem: Health Behavior/Discharge Planning: Goal: Ability to manage health-related needs will improve Outcome: Progressing   Problem: Clinical Measurements: Goal: Ability to maintain clinical measurements within normal limits will improve Outcome: Progressing Goal: Will remain free from infection Outcome: Progressing Goal: Diagnostic test results will improve Outcome: Progressing Goal: Respiratory complications will improve Outcome: Progressing Goal: Cardiovascular complication will be avoided Outcome: Progressing   Problem: Activity: Goal: Risk for activity intolerance will decrease Outcome: Progressing   Problem: Nutrition: Goal: Adequate nutrition will be maintained Outcome: Progressing   Problem: Coping: Goal: Level of anxiety will decrease Outcome: Progressing   Problem: Elimination: Goal: Will not experience complications related to bowel motility Outcome: Progressing Goal:  Will not experience complications related to urinary retention Outcome: Progressing   Problem: Pain Managment: Goal: General experience of comfort will improve Outcome: Progressing   Problem: Safety: Goal: Ability to remain free from injury will improve Outcome: Progressing   Problem: Skin Integrity: Goal: Risk for impaired skin integrity will decrease Outcome: Progressing   

## 2023-06-30 NOTE — Progress Notes (Signed)
Date of Admission:  06/13/2023     ID: Jorge Rosales is a 77 y.o. 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  .edentulous Neck:  symmetrical, no adenopathy, thyroid: non tender no carotid bruit and no JVD. Lungs:b/l rhonchi Heart: tachycardia Abdomen: Soft, non-tender,not distended. Bowel sounds normal. No masses Extremities: atraumatic, no cyanosis. No edema. No clubbing Skin: No rashes or lesions. Or bruising Lymph: Cervical, supraclavicular normal. Neurologic: cannot assess  Lab Results    Latest Ref Rng & Units 07/23/2023    4:48 AM 05/30/2023    6:06 PM 06/23/2023    3:48 AM  CBC  WBC 4.0 - 10.5 K/uL 23.5  18.6  13.9   Hemoglobin 13.0 - 17.0 g/dL 47.8  29.5  62.1   Hematocrit 39.0 - 52.0 % 35.9  37.3  31.9   Platelets 150 - 400 K/uL 624  506  314        Latest Ref Rng & Units 07/09/2023    4:48 AM 06/29/2023    8:37 AM 06/24/2023    6:06 PM  CMP  Glucose 70 - 99 mg/dL 98  308  657   BUN 8 - 23 mg/dL 12  12  16    Creatinine 0.61 - 1.24 mg/dL 8.46  9.62  9.52   Sodium 135 - 145 mmol/L 138  133  130   Potassium 3.5 - 5.1 mmol/L 3.9  4.4  3.9   Chloride 98 - 111 mmol/L 102  98  96   CO2 22 - 32 mmol/L 28  25  28    Calcium 8.9 - 10.3 mg/dL 7.9  7.8  7.7   Total Protein 6.5 - 8.1 g/dL   7.5   Total Bilirubin 0.3 - 1.2 mg/dL   0.4   Alkaline Phos 38 - 126 U/L   125   AST 15 - 41 U/L   20   ALT 0 - 44 U/L   17       Microbiology: Sputum culture  pending 06/13/2023 [employer] smear X 2 sent  Studies/Results: PERIPHERAL VASCULAR CATHETERIZATION  Result Date: 07/19/2023 See surgical note for result.  CT Angio Chest Pulmonary Embolism (PE) W or WO Contrast  Addendum Date: 06/29/2023   ADDENDUM REPORT: 06/29/2023 19:27 ADDENDUM: These results were called by telephone at the time of interpretation on 06/07/2023 at 10:32 p.m. to provider Dr. Roxan Hockey, who verbally acknowledged these results. Electronically Signed   By: Darliss Cheney M.D.   On: 06/29/2023 19:27   Result Date: 06/29/2023 CLINICAL DATA:  High probability for PE.  Shortness of breath. EXAM: CT ANGIOGRAPHY CHEST WITH CONTRAST TECHNIQUE: Multidetector CT imaging of the chest was performed using the standard protocol during bolus administration of intravenous contrast. Multiplanar CT image reconstructions and MIPs were obtained to evaluate the vascular anatomy. RADIATION DOSE REDUCTION: This exam was performed according to the departmental dose-optimization program which includes automated exposure control, adjustment of the mA and/or kV  according to patient size and/or use of iterative reconstruction technique. CONTRAST:  75mL OMNIPAQUE IOHEXOL 350 MG/ML SOLN COMPARISON:  CT angiogram chest 06/20/2023 FINDINGS: Cardiovascular: There is adequate opacification of the pulmonary arteries. Pulmonary emboli are seen within the distal right main pulmonary artery extending into segmental and subsegmental branches of the right upper lobe. There also segmental right lower lobe and right middle lobe pulmonary emboli. Heart is normal in size. No evidence for aortic aneurysm. There are atherosclerotic calcifications of the aorta. There is no pericardial effusion. Mediastinum/Nodes: No enlarged mediastinal, hilar, or axillary lymph nodes. Thyroid gland, trachea, and esophagus demonstrate no significant findings. Lungs/Pleura: Large left upper lobe infiltrate with air bronchograms again seen. There is a new  air-fluid level in the anterior apex measuring 3.5 x 4.5 by 2.0 cm. Focal spiculated density in the posterior left lower lobe appears unchanged. Lingular airspace disease has decreased, but there is some persistent ground-glass opacity in this region. Linear opacity with ground-glass in the right upper lobe appears unchanged. Emphysematous changes are again noted. No pleural effusion or pneumothorax. Upper Abdomen: No acute findings. There is a right renal cyst measuring 2.1 cm. Musculoskeletal: Multilevel degenerative changes affect the spine. Review of the MIP images confirms the above findings. IMPRESSION: 1. Acute pulmonary emboli in the distal right main pulmonary artery extending into segmental and subsegmental branches of the right upper lobe. Additional segmental and subsegmental pulmonary emboli in the right lower lobe and right middle lobe. Positive for acute PE with CT evidence of right heart strain (RV/LV Ratio = 1.3) consistent with at least submassive (intermediate risk) PE. The presence of right heart strain has been associated with an increased risk of morbidity and mortality. 2. New air-fluid level in the left apex worrisome for abscess. 3. Left upper lobe infiltrate otherwise appears unchanged. 4. Lingular airspace disease has decreased, but there is some persistent ground-glass opacity in this region. 5. Spiculated density in the left lower lobe is unchanged. 6. Stable ground-glass opacity in the right upper lobe. Electronically Signed: By: Darliss Cheney M.D. On: 06/26/2023 22:28   ECHOCARDIOGRAM COMPLETE  Result Date: 06/29/2023    ECHOCARDIOGRAM REPORT   Patient Name:   Jorge Rosales Date of Exam: 06/29/2023 Medical Rec #:  [id number]    Height:       70.0 in Accession #:    [id number]   Weight:       123.2 lb Date of Birth:  1946-05-14     BSA:          1.699 m Patient Age:    77 years     BP:           123/79 mmHg Patient Gender: M            HR:           100 bpm. Exam Location:  [facility]  Procedure: 2D Echo, Cardiac Doppler and Color Doppler Indications:     Pulmonary embolus I26.09  History:         Patient has prior history of Echocardiogram examinations, most                  recent 05/16/2023. COPD. History of pulmonary embolism, former                  heavy smoker.  Sonographer:     Cristela Blue Referring Phys:  [id number] BRITTON L R[place] Diagnosing Phys: Julien Nordmann MD  Sonographer Comments: Technically challenging study due to limited acoustic  windows and no apical window. Image acquisition challenging due to COPD. IMPRESSIONS  1. Left ventricular ejection fraction, by estimation, is 60 to 65%. The left ventricle has normal function. The left ventricle has no regional wall motion abnormalities. Left ventricular diastolic parameters are indeterminate.  2. Right ventricular systolic function is normal. The right ventricular size is normal. There is mildly elevated pulmonary artery systolic pressure. The estimated right ventricular systolic pressure is 41.0 mmHg.  3. The mitral valve is normal in structure. No evidence of mitral valve regurgitation. No evidence of mitral stenosis.  4. Tricuspid valve regurgitation is mild to moderate.  5. The aortic valve has an indeterminant number of cusps. Aortic valve regurgitation is not visualized. No aortic stenosis is present.  6. The inferior vena cava is normal in size with greater than 50% respiratory variability, suggesting right atrial pressure of 3 mmHg. FINDINGS  Left Ventricle: Left ventricular ejection fraction, by estimation, is 60 to 65%. The left ventricle has normal function. The left ventricle has no regional wall motion abnormalities. The left ventricular internal cavity size was normal in size. There is  no left ventricular hypertrophy. Left ventricular diastolic parameters are indeterminate. Right Ventricle: The right ventricular size is normal. No increase in right ventricular wall thickness. Right ventricular systolic function is  normal. There is mildly elevated pulmonary artery systolic pressure. The tricuspid regurgitant velocity is 3.00  m/s, and with an assumed right atrial pressure of 5 mmHg, the estimated right ventricular systolic pressure is 41.0 mmHg. Left Atrium: Left atrial size was normal in size. Right Atrium: Right atrial size was normal in size. Pericardium: There is no evidence of pericardial effusion. Mitral Valve: The mitral valve is normal in structure. No evidence of mitral valve regurgitation. No evidence of mitral valve stenosis. Tricuspid Valve: The tricuspid valve is normal in structure. Tricuspid valve regurgitation is mild to moderate. No evidence of tricuspid stenosis. Aortic Valve: The aortic valve has an indeterminant number of cusps. Aortic valve regurgitation is not visualized. No aortic stenosis is present. Pulmonic Valve: The pulmonic valve was normal in structure. Pulmonic valve regurgitation is not visualized. No evidence of pulmonic stenosis. Aorta: The aortic root is normal in size and structure. Venous: The inferior vena cava is normal in size with greater than 50% respiratory variability, suggesting right atrial pressure of 3 mmHg. IAS/Shunts: No atrial level shunt detected by color flow Doppler.  LEFT VENTRICLE PLAX 2D LVIDd:         4.30 cm LVIDs:         3.00 cm LV PW:         0.90 cm LV IVS:        0.80 cm LVOT diam:     2.10 cm LVOT Area:     3.46 cm  RIGHT VENTRICLE RV Basal diam:  4.10 cm RV Mid diam:    3.60 cm LEFT ATRIUM         Index LA diam:    1.90 cm 1.12 cm/m   AORTA Ao Root diam: 3.30 cm TRICUSPID VALVE TR Peak grad:   36.0 mmHg TR Vmax:        300.00 cm/s  SHUNTS Systemic Diam: 2.10 cm Julien Nordmann MD Electronically signed by Julien Nordmann MD Signature Date/Time: 06/29/2023/12:01:03 PM    Final    US Venous Img Lower Bilateral (DVT)  Result Date: 06/29/2023 CLINICAL DATA:  77 year old male with history of pulmonary embolism. EXAM: BILATERAL LOWER EXTREMITY VENOUS DOPPLER  ULTRASOUND TECHNIQUE: Gray-scale sonography with  graded compression, as well as color Doppler and duplex ultrasound were performed to evaluate the lower extremity deep venous systems from the level of the common femoral vein and including the common femoral, femoral, profunda femoral, popliteal and calf veins including the posterior tibial, peroneal and gastrocnemius veins when visible. The superficial great saphenous vein was also interrogated. Spectral Doppler was utilized to evaluate flow at rest and with distal augmentation maneuvers in the common femoral, femoral and popliteal veins. COMPARISON:  None Available. FINDINGS: RIGHT LOWER EXTREMITY Common Femoral Vein: No evidence of thrombus. Normal compressibility, respiratory phasicity and response to augmentation. Saphenofemoral Junction: No evidence of thrombus. Normal compressibility and flow on color Doppler imaging. Profunda Femoral Vein: No evidence of thrombus. Normal compressibility and flow on color Doppler imaging. Femoral Vein: No evidence of thrombus. Normal compressibility, respiratory phasicity and response to augmentation. Popliteal Vein: Nonocclusive, mildly expansile, thrombus is present. The thrombus is mostly hypoechoic with a few areas of heterogeneously hyperechoic linear morphology. Calf Veins: Occlusive, hypoechoic, minimally expansile thrombus throughout. Other Findings: Mostly simple appearing hypoechoic structure in the popliteal fossa measuring proximally 2.1 x 1.3 x 1.1 cm. LEFT LOWER EXTREMITY Common Femoral Vein: No evidence of thrombus. Normal compressibility, respiratory phasicity and response to augmentation. Saphenofemoral Junction: No evidence of thrombus. Normal compressibility and flow on color Doppler imaging. Profunda Femoral Vein: No evidence of thrombus. Normal compressibility and flow on color Doppler imaging. Femoral Vein: There is mildly expansile, heterogeneously isoechoic, occlusive thrombus in the central segment of  the femoral vein. The peripheral femoral vein is patent. Popliteal Vein: Patent centrally with occlusive, mildly expansile, heterogeneously isoechoic thrombus through the peripheral popliteal extending into the calf veins. Calf Veins: Occlusive, heterogeneously isoechoic thrombus throughout. Other Findings:  None. IMPRESSION: 1. Subacute appearing right lower extremity deep vein thrombosis limited to the popliteal and calf veins. 2. Subacute appearing left lower extremity deep vein thrombosis in the central left femoral vein in addition to the peripheral popliteal vein extending into the calf veins. 3. No evidence of iliac vein extension. 4. Right simple appearing Baker cyst measuring up to 2.1 cm. These results will be called to the ordering clinician or representative by the Radiologist Assistant, and communication documented in the PACS or [employer]. Marliss Coots, MD [facility] Electronically Signed   By: Marliss Coots M.D.   On: 06/29/2023 08:08   DG Chest 1 View  Result Date: 06/14/2023 CLINICAL DATA:  History of pneumonia, sepsis EXAM: CHEST  1 VIEW COMPARISON:  06/20/2023 FINDINGS: 2 frontal views of the chest demonstrate a stable cardiac silhouette. There is persistent dense consolidation within the left upper lobe, with superior retraction of the left hilum consistent with volume loss. Airspace disease in the left perihilar region has slightly improved. Right chest is clear. Stable hyperinflation and emphysema. No effusion or pneumothorax. No acute bony abnormalities. IMPRESSION: 1. Persistent dense consolidation within the left upper lobe, compatible with pneumonia. The patchy areas of airspace disease in the left perihilar region seen on prior chest x-ray demonstrate mild improvement. 2. Stable emphysema. Electronically Signed   By: Sharlet Salina M.D.   On: 06/27/2023 19:28     Assessment/Plan: 77 year old male with history of  COPD admitted with shortness of breath.  He was recently hospitalized for left upper lobe pneumonia and was discharged on 06/22/2021 to the skilled nursing facility  Acute hypoxic respiratory failure secondary to acute PE Patient underwent mechanical thrombectomy today. On anticoagulation  Left upper lobe pneumonia with  a lung abscess Bacterial culture pending Sputum for AFB x 2 has been sent. Continue airborne isolation Will need 1 more sputum Patient is currently on Zosyn As MRSA nares was negative vancomycin was discontinued  Worsening leukocytosis secondary to to the above on repeat   COPD  Weight loss due to absent teeth and COPD Need to rule out malignancy  Discussed the management with the daughter at bedside Discussed with the care team [facility] physician on-call this weekend Available for urgent issues by phone.

## 2023-06-30 NOTE — Progress Notes (Signed)
ANTICOAGULATION CONSULT NOTE  Pharmacy Consult for Warfarin, Heparin  Indication: DVT  Allergies  Allergen Reactions   Wound Dressing Adhesive Rash    Rash with blisters; band-aids espescially. Per [facility] Clinical Summary   Latex Rash    Per [facility] Clinical Summary    Patient Measurements: Height: 5\' 10"  (177.8 cm) Weight: 55.9 kg (123 lb 3.8 oz) IBW/kg (Calculated) : 73 Heparin Dosing Weight: 58 kg   Vital Signs: Temp: 97.5 F (36.4 C) (08/02 0200) Temp Source: Axillary (08/02 0200) BP: 140/81 (08/02 0516) Pulse Rate: 107 (08/02 0516)  Labs: Recent Labs    2023/07/20 1806 06/29/23 0005 06/29/23 0837 06/29/23 1759 06/30/23 0448  HGB 12.0*  --   --   --  11.6*  HCT 37.3*  --   --   --  35.9*  PLT 506*  --   --   --  624*  APTT  --  44*  --   --   --   LABPROT 21.6*  --   --   --   --   INR 1.9*  --   --   --   --   HEPARINUNFRC  --   --  <0.10* <0.10* 0.17*  CREATININE 0.54*  --  0.50*  --  0.52*  TROPONINIHS 14  --   --   --   --     Estimated Creatinine Clearance: 62.1 mL/min (A) (by C-G formula based on SCr of 0.52 mg/dL (L)).   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  Started on Tegretol.  No further episodes.    Medications:  Medications Prior to Admission  Medication Sig Dispense Refill Last Dose   albuterol (VENTOLIN HFA) 108 (90 Base) MCG/ACT inhaler Inhale 2 puffs into the lungs every 4  (four) hours as needed.   Jul 20, 2023   carbamazepine (TEGRETOL) 200 MG tablet Take 1 tablet by mouth 3 (three) times daily.   July 20, 2023   DULoxetine (CYMBALTA) 30 MG capsule Take 30 mg by mouth daily.   20-Jul-2023   feeding supplement (ENSURE ENLIVE / ENSURE PLUS) LIQD Take 237 mLs by mouth 3 (three) times daily between meals. 237 mL 12 07/20/23   ipratropium-albuterol (DUONEB) 0.5-2.5 (3) MG/3ML SOLN Take 3 mLs by nebulization every 6 (six) hours as needed. 360 mL 3 07/20/23   megestrol (MEGACE) 400 MG/10ML suspension Take 10 mLs (400 mg total) by mouth 2 (two) times daily. 240 mL 0 20-Jul-2023   Multiple Vitamin (MULTIVITAMIN WITH MINERALS) TABS tablet Take 1 tablet by mouth daily. 30 tablet 3 07-20-23   TRELEGY ELLIPTA 100-62.5-25 MCG/ACT AEPB Inhale 1 puff into the lungs daily.   20-Jul-2023   warfarin (COUMADIN) 5 MG tablet Take 1 tablet (5 mg total) by mouth daily at 4 PM. 30 tablet 0 2023-07-20    Assessment: Pharmacy consulted to dose heparin and warfarin for DVT treatment in  this 77 year old male admitted with sepsis, PNA.   Pt was on warfarin 5 mg PO daily PTA  Goal of Therapy:  HL :  0.3 -0.7  Monitor platelets by anticoagulation protocol: Yes   Plan: 8/2:  HL @ 0448 = 0.17, SUBtherapeutic  ---No bolus per vascular surgery ---increase heparin infusion rate to 1550  units/hr ---Check anti-Xa level in 8 hours after rate change and at least once daily while on heparin ---Continue to monitor H&H and platelets  [place] Whedbee D 06/30/2023 5:47 AM

## 2023-06-30 NOTE — Progress Notes (Signed)
ANTICOAGULATION CONSULT NOTE  Pharmacy Consult for Warfarin, Heparin  Indication: DVT  Allergies  Allergen Reactions   Wound Dressing Adhesive Rash    Rash with blisters; band-aids espescially. Per [facility] Clinical Summary   Latex Rash    Per [facility] Clinical Summary    Patient Measurements: Height: 5\' 10"  (177.8 cm) Weight: 55.9 kg (123 lb 3.8 oz) IBW/kg (Calculated) : 73 Heparin Dosing Weight: 58 kg   Vital Signs: Temp: 97.7 F (36.5 C) (08/02 2000) Temp Source: Rectal (08/02 2000) BP: 138/81 (08/02 2206) Pulse Rate: 115 (08/02 2200)  Labs: Recent Labs    06/07/2023 1806 06/29/23 0005 06/29/23 0837 06/29/23 0837 06/29/23 1759 06/30/23 0448 06/30/23 2153  HGB 12.0*  --   --   --   --  11.6*  --   HCT 37.3*  --   --   --   --  35.9*  --   PLT 506*  --   --   --   --  624*  --   APTT  --  44*  --   --   --   --   --   LABPROT 21.6*  --   --   --   --   --   --   INR 1.9*  --   --   --   --   --   --   HEPARINUNFRC  --   --  <0.10*   < > <0.10* 0.17* 0.14*  CREATININE 0.54*  --  0.50*  --   --  0.52*  --   TROPONINIHS 14  --   --   --   --   --   --    < > = values in this interval not displayed.    Estimated Creatinine Clearance: 62.1 mL/min (A) (by C-G formula based on SCr of 0.52 mg/dL (L)).   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  Started on Tegretol.  No further episodes.    Medications:   Medications Prior to Admission  Medication Sig Dispense Refill Last Dose   albuterol (VENTOLIN HFA) 108 (90 Base) MCG/ACT inhaler Inhale 2 puffs into the lungs every 4 (four) hours as needed.   06/11/2023   carbamazepine (TEGRETOL) 200 MG tablet Take 1 tablet by mouth 3 (three) times daily.   06/04/2023   DULoxetine (CYMBALTA) 30 MG capsule Take 30 mg by mouth daily.   06/13/2023   feeding supplement (ENSURE ENLIVE / ENSURE PLUS) LIQD Take 237 mLs by mouth 3 (three) times daily between meals. 237 mL 12 06/24/2023   ipratropium-albuterol (DUONEB) 0.5-2.5 (3) MG/3ML SOLN Take 3 mLs by nebulization every 6 (six) hours as needed. 360 mL 3 06/13/2023   megestrol (MEGACE) 400 MG/10ML suspension Take 10 mLs (400 mg total) by mouth 2 (two) times daily. 240 mL 0 06/11/2023   Multiple Vitamin (MULTIVITAMIN WITH  MINERALS) TABS tablet Take 1 tablet by mouth daily. 30 tablet 3 05/31/2023   TRELEGY ELLIPTA 100-62.5-25 MCG/ACT AEPB Inhale 1 puff into the lungs daily.   06/02/2023   warfarin (COUMADIN) 5 MG tablet Take 1 tablet (5 mg total) by mouth daily at 4 PM. 30 tablet 0 06/02/2023    Assessment: Pharmacy consulted to dose heparin and warfarin for DVT treatment in this 77 year old male admitted with sepsis, PNA.   Pt was on warfarin 5 mg PO daily PTA  Goal of Therapy:  HL :  0.3 -0.7  Monitor platelets by anticoagulation protocol: Yes   Plan: 8/2:  HL @ 2151 = 0.49, therapeutic X 1  ---No bolus per vascular surgery ---increase heparin infusion rate to 1750  units/hr ---Check anti-Xa level in 8 hours after rate change and at least once daily while on heparin ---Continue to monitor H&H and platelets  Margo Lama D 06/30/2023 11:04 PM

## 2023-07-01 ENCOUNTER — Encounter: Payer: Self-pay | Admitting: Internal Medicine

## 2023-07-01 DIAGNOSIS — J851 Abscess of lung with pneumonia: Secondary | ICD-10-CM

## 2023-07-01 DIAGNOSIS — J9621 Acute and chronic respiratory failure with hypoxia: Secondary | ICD-10-CM | POA: Diagnosis not present

## 2023-07-01 DIAGNOSIS — J449 Chronic obstructive pulmonary disease, unspecified: Secondary | ICD-10-CM

## 2023-07-01 DIAGNOSIS — Z7189 Other specified counseling: Secondary | ICD-10-CM | POA: Diagnosis not present

## 2023-07-01 DIAGNOSIS — Z86711 Personal history of pulmonary embolism: Secondary | ICD-10-CM | POA: Diagnosis not present

## 2023-07-01 DIAGNOSIS — J189 Pneumonia, unspecified organism: Secondary | ICD-10-CM | POA: Diagnosis not present

## 2023-07-01 MED ORDER — METOPROLOL TARTRATE 25 MG PO TABS
25.0000 mg | ORAL_TABLET | Freq: Two times a day (BID) | ORAL | Status: DC
  Administered 2023-07-01 (×2): 25 mg via ORAL
  Filled 2023-07-01 (×2): qty 1

## 2023-07-01 NOTE — TOC Initial Note (Signed)
Transition of Care [facility]) - Initial/Assessment Note    Patient Details  Name: Jorge Rosales MRN: [id number] Date of Birth: June 06, 1946  [facility]) CM/SW Contact:    Colette Ribas, LCSWA Phone Number: 07/01/2023, 9:15 AM  Clinical Narrative:                  Patient is a readmit under 30 days, last admission 06/19/2023. Per previous assessment.  Spoke with the patient He has oxygen at home and does not remember the name of the company that provides it, He walks independently He stated that he would like PT at home, he does not have a preference of companies, I contacted [place] with [employer] and they accepted the patent. Daughter will transport the patient      Patient Goals and CMS Choice            Expected Discharge Plan and Services                                              Prior Living Arrangements/Services                       Activities of Daily Living Home Assistive Devices/Equipment: None ADL Screening (condition at time of admission) Patient's cognitive ability adequate to safely complete daily activities?: Yes Is the patient deaf or have difficulty hearing?: No Does the patient have difficulty seeing, even when wearing glasses/contacts?: No Does the patient have difficulty concentrating, remembering, or making decisions?: No Patient able to express need for assistance with ADLs?: Yes Does the patient have difficulty dressing or bathing?: No Independently performs ADLs?: Yes (appropriate for developmental age) Does the patient have difficulty walking or climbing stairs?: No Weakness of Legs: None Weakness of Arms/Hands: None  Permission Sought/Granted                  Emotional Assessment              Admission diagnosis:  Respiratory failure with hypoxia (HCC) [J96.91] HCAP (healthcare-associated pneumonia) [J18.9] Sepsis, due to unspecified organism, unspecified whether acute organ dysfunction present  [facility]) [A41.9] Patient Active Problem List   Diagnosis Date Noted   Abscess of upper lobe of left lung with pneumonia (HCC) 07/19/2023   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) 06/29/2023   Acute respiratory failure with hypoxia (HCC) 06/19/2023   Respiratory failure with hypoxia (HCC) 06/02/2023   Protein-calorie malnutrition, severe 06/22/2023   Debility 06/21/2023   Hypokalemia 06/21/2023   Supratherapeutic INR 06/18/2023   Hyponatremia 06/18/2023   History of seizures 06/18/2023   History of pulmonary embolism 06/17/2023   Warfarin anticoagulation 06/17/2023   Left upper lobe pneumonia 06/17/2023   Sepsis (HCC) 06/17/2023   CAP (community acquired pneumonia) 06/17/2023   Bradycardia by electrocardiogram 01/23/2023   Pulmonary hypertension, unspecified (HCC) 01/23/2023   Nonspecific abnormal electrocardiogram (ECG) (EKG) 01/23/2023   Acute pulmonary embolism (HCC) 05/16/2021   Pulmonary embolism (HCC) 05/16/2021   Pulmonary cavitary lesion 04/11/2014   Smoker 01/23/2014   Solitary pulmonary nodule 01/23/2014   COPD GOLD III 01/22/2014   Seizure disorder (HCC) 06/08/2009   PCP:  [facility]:   [facility] [phone] [place], [place] - [address] [place] [address] Phone: [phone] Fax:  [phone]  [facility]) - [place], [place] - [address] [facility] [address] [place] [address] [place] [address] Phone: [phone] Fax: [phone]  [facility] [address] [place] - [address] [place] [address] Phone: [phone] Fax: [phone]  [facility] [address], [place] - [address] [place] [address] Phone: [phone] Fax: [phone]  [id number] [place], [place] - [address] [facility] [address] [facility] [address] [place] [address] Phone: [phone] Fax:  [phone]  [facility] [address] [place] [address] Phone: [phone] Fax: [phone]     Social Determinants of Health (SDOH) Social History: SDOH Screenings   Food Insecurity: No Food Insecurity (07/08/2023)  Housing: Patient Declined (06/29/2023)  Transportation Needs: No Transportation Needs (07/19/2023)  Utilities: Not At Risk (07/14/2023)  Financial Resource Strain: Low Risk  (10/26/2022)   Received from [facility], [facility]  Tobacco Use: Medium Risk (06/19/2023)   SDOH Interventions:     Readmission Risk Interventions     No data to display

## 2023-07-01 NOTE — Progress Notes (Signed)
at [facility]   PATIENT NAME: Jorge Rosales    MR#:  [id number]  PCP: [facility]  DATE OF BIRTH:  December 01, 1945  SUBJECTIVE:  CHIEF COMPLAINT:   Chief Complaint  Patient presents with   Shortness of Breath  Short of breath, on BiPAP, gasping for breath, sleepy REVIEW OF SYSTEMS:  Review of Systems  Reason unable to perform ROS: On BiPAP.   DRUG ALLERGIES:   Allergies  Allergen Reactions   Wound Dressing Adhesive Rash    Rash with blisters; band-aids espescially. Per [facility] Clinical Summary   Latex Rash    Per [facility] Clinical Summary   VITALS:  Blood pressure (!) 152/82, pulse (!) 107, temperature 97.8 F (36.6 C), temperature source Axillary, resp. rate (!) 21, height 5\' 10"  (1.778 m), weight 55.9 kg, SpO2 100%. PHYSICAL EXAMINATION:  Physical Exam Vitals and nursing note reviewed.  Constitutional:      General: He is in acute distress.     Appearance: He is ill-appearing.  HENT:     Head: Normocephalic and atraumatic.  Eyes:     Extraocular Movements: Extraocular movements intact.     Pupils: Pupils are equal, round, and reactive to light.  Cardiovascular:     Rate and Rhythm: Regular rhythm. Tachycardia present.  Pulmonary:     Effort: Tachypnea, accessory muscle usage and respiratory distress present.     Breath sounds: Examination of the right-lower field reveals decreased breath sounds and rhonchi. Examination of the left-lower field reveals decreased breath sounds and rhonchi. Decreased breath sounds and rhonchi present.  Abdominal:     General: Bowel sounds are normal.     Palpations: Abdomen is soft.  Musculoskeletal:        General: Normal range of motion.     Cervical back: Normal range of motion and neck supple.  Skin:    General: Skin is warm and dry.     Coloration: Skin is cyanotic.     Comments: He has mottling on the right side of the body also in the right great toe in the lower foot  Neurological:     General:  No focal deficit present.     Mental Status: He is alert and oriented to person, place, and time.  Psychiatric:        Mood and Affect: Mood normal.        Behavior: Behavior normal.    LABORATORY PANEL:  Male CBC Recent Labs  Lab 07/01/23 0714  WBC 24.1*  HGB 10.6*  HCT 33.2*  PLT 581*   ------------------------------------------------------------------------------------------------------------------ Chemistries  Recent Labs  Lab 06/24/2023 1806 06/29/23 0837 07/16/2023 0448 07/01/23 0714 07/01/23 0716  NA 130*   < > 138   < > 138  K 3.9   < > 3.9   < > 4.0  CL 96*   < > 102   < > 102  CO2 28   < > 28   < > 28  GLUCOSE 187*   < > 98   < > 113*  BUN 16   < > 12   < > 12  CREATININE 0.54*   < > 0.52*   < > 0.49*  CALCIUM 7.7*   < > 7.9*   < > 8.0*  MG  --   --  2.4  --   --   AST 20  --   --   --   --   ALT 17  --   --   --   --  ALKPHOS 125  --   --   --   --   BILITOT 0.4  --   --   --   --    < > = values in this interval not displayed.   MEDICATIONS:  Scheduled Meds:  budesonide (PULMICORT) nebulizer solution  0.25 mg Nebulization BID   Chlorhexidine Gluconate Cloth  6 each Topical Daily   docusate sodium  100 mg Oral BID   DULoxetine  30 mg Oral Daily   ipratropium-albuterol  3 mL Nebulization Once   ipratropium-albuterol  3 mL Nebulization TID AC   metoprolol tartrate  25 mg Oral BID   mouth rinse  15 mL Mouth Rinse 4 times per day   pantoprazole (PROTONIX) IV  40 mg Intravenous Q24H   sodium chloride flush  3 mL Intravenous Q12H   Continuous Infusions:  heparin 1,750 Units/hr (07/01/23 1310)   piperacillin-tazobactam (ZOSYN)  IV Stopped (07/01/23 1301)   RADIOLOGY:  DG Chest Port 1 View  Result Date: 07/07/2023 CLINICAL DATA:  Shortness of breath EXAM: PORTABLE CHEST 1 VIEW COMPARISON:  06/11/2023 FINDINGS: There is hyperinflation of the lungs compatible with COPD. Heart is normal size. Mediastinal contours are within normal limits. Dense airspace  disease again noted in the left upper lobe, unchanged since recent chest x-ray and chest CT. Linear scarring in the right upper lobe. No new acute infiltrates. No visible effusions or pneumothorax. No acute bony abnormality. IMPRESSION: Stable dense consolidation in the left upper lobe. Underlying emphysema. Electronically Signed   By: Charlett Nose M.D.   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  Discontinue vancomycin as MRSA negative Appreciate ID input.  Checking AFB sputum to rule out atypical mycobacteria/TB.  Continue airborne isolation Continue nebs and bronchodilators  Acute submassive PE Subacute DVT in bilateral lower extremity Status post bilateral pulmonary thrombectomy and IVC filter insertion by vascular surgery Continue heparin drip Echo shows normal LV systolic function with a EF of 60 to 65%.  Mildly elevated pulmonary artery systolic pressure Will consider transition to DOAC instead of Coumadin when transition from heparin  Unintentional weight loss 40 pounds in last 6 months Patient was functional and driving before 6 1 but has had significant clinical and functional decline in last few months especially since last hospitalization.  Palliative care consult for goals of care discussion Checking AFB sputum per ID  History of seizure He had been on  carbamazepine since 80s and had not seen neurology or not reported any further seizure Will stop carbamazepine at this time as it does have significant interaction with blood thinners  Goals of care Palliative care consult.  Overall poor prognosis I have added low-dose morphine for air hunger per patient/family request.  Also added Ativan IV 1 mg every 4 hours as needed for agitation His overall picture is worrisome with  mottling, air hunger and likely terminal agitation.  Family still deciding on CODE STATUS as patient was undecided  Body mass index is 17.68 kg/m.  Net IO Since Admission: 1,438.56 mL [07/01/23 1449]      LOS: 3 days   Consultants: ID Vascular surgery Intensivist Palliative care    Antibiotics: IV Zosyn  Status is: Inpatient 3-4   DVT prophylaxis:   Heparin drip     He remains critically sick with high risk for multiorgan failure, acute cardiorespiratory failure and death.  Monitor in stepdown for now    Family Communication: Updated daughter at bedside   All the records are reviewed and case discussed with Nursing and [facility] team. Management plans discussed with the patient, family and they are in agreement.  CODE STATUS: Full Code Level of care: Stepdown  TOTAL TIME TAKING CARE OF THIS PATIENT: 35 minutes.   More than 50% of the time was spent in counseling/coordination of care: YES  POSSIBLE D/C IN 3-4 DAYS, DEPENDING ON CLINICAL CONDITION.   Delfino Lovett M.D on 07/01/2023 at 2:49 PM  [facility]   CC: Primary care physician; [facility]  Note: This dictation was prepared with Dragon dictation along with smaller phrase technology. Any transcriptional errors that result from this process are unintentional.

## 2023-07-01 NOTE — Progress Notes (Signed)
2232 - Pt called out regarding continued increased WOB and SOB. Continues to use accessory muscles. HR 110's-120s, RR 20's, SpO2 >95% on BiPAP, BP WDL. Scheduled nebs, PRN Morphine x2 and Ativan x1 given per EMAR/orders. Pt reports minimal relief from medications. Provider paged to notify. Awaiting response. Pt's daughter remains at bedside, call bell within reach.     2345 - PRN Ativan and Morphine administered. Pt noted to have an ease in his WOB and reports that breathing felt better at the moment.   0045 - Pt continues to rest comfortably. No c/o increased WOB or SOB since most recent administration of PRN medications. Provider notified via page. Requested order for maintenance IVF due to minimal PO intake since PTA. Awaiting response.     1610 - Per provider - defer to primary care regarding addition of maintenance fluid. Will pass on to dayshift RN to address during rounds.   9604 - Pt has not urinated since just before night shift. Dayshift reported in rounds that pt had an incontinence episode at approximately 1830 requiring a full bed change. Initial bladder scan at approximately 0300 shows < urine. Repeat bladder scan at approximately 0600 shows urine in bladder. Pt denies urge/need to urinate. I&O cath completed per protocol with approximately urine out. Repeat bladder scan after I&O shows 0mL urine in bladder.

## 2023-07-01 NOTE — Progress Notes (Signed)
ANTICOAGULATION CONSULT NOTE  Pharmacy Consult for Warfarin, Heparin  Indication: DVT  Allergies  Allergen Reactions   Wound Dressing Adhesive Rash    Rash with blisters; band-aids espescially. Per [facility] Clinical Summary   Latex Rash    Per [facility] Clinical Summary    Patient Measurements: Height: 5\' 10"  (177.8 cm) Weight: 55.9 kg (123 lb 3.8 oz) IBW/kg (Calculated) : 73 Heparin Dosing Weight: 58 kg   Vital Signs: Temp: 97.9 F (36.6 C) (08/03 0400) Temp Source: Axillary (08/03 0400) BP: 155/86 (08/03 0800) Pulse Rate: 96 (08/03 0800)  Labs: Recent Labs    07-02-2023 1806 06/29/23 0005 06/29/23 0837 06/30/23 0448 06/30/23 2153 07/01/23 0714 07/01/23 0716  HGB 12.0*  --   --  11.6*  --  10.6*  --   HCT 37.3*  --   --  35.9*  --  33.2*  --   PLT 506*  --   --  624*  --  581*  --   APTT  --  44*  --   --   --   --   --   LABPROT 21.6*  --   --   --   --   --   --   INR 1.9*  --   --   --   --   --   --   HEPARINUNFRC  --   --    < > 0.17* 0.14* 0.50  --   CREATININE 0.54*  --    < > 0.52*  --  0.52* 0.49*  TROPONINIHS 14  --   --   --   --   --   --    < > = values in this interval not displayed.    Estimated Creatinine Clearance: 62.1 mL/min (A) (by C-G formula based on SCr of 0.49 mg/dL (L)).   Medical History: Past Medical History:  Diagnosis Date   Anticoagulant long-term use    coumadin   Cataracts, both eyes    Cavitary lesion of lung 2015   likely necrotic process w residual cavity. 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  On long-term warfarin   Malignant neoplasm of skin    On home oxygen therapy    4-5L [place]   Osteoarthritis of both knees    Protein-calorie malnutrition, severe (HCC)    Seizure disorder  (HCC) 1982   He had a "drop attack clinical diagnosed with seizure disorder.  Started on Tegretol.  No further episodes.   Stage 3 severe COPD by GOLD classification (HCC)     Medications:  Medications Prior to Admission  Medication Sig Dispense Refill Last Dose   albuterol (VENTOLIN HFA) 108 (90 Base) MCG/ACT inhaler Inhale 2 puffs into the lungs every 4 (four) hours as needed.   07-02-2023   carbamazepine (TEGRETOL) 200 MG tablet Take 1 tablet by mouth 3 (three) times daily.   07-02-23   DULoxetine (CYMBALTA) 30 MG capsule Take 30 mg by mouth daily.   07-02-2023   feeding supplement (ENSURE ENLIVE / ENSURE PLUS) LIQD Take 237 mLs by mouth 3 (three) times daily between meals. 237 mL 12 2023/07/02   ipratropium-albuterol (DUONEB) 0.5-2.5 (3) MG/3ML SOLN Take 3  mLs by nebulization every 6 (six) hours as needed. 360 mL 3 06/06/2023   megestrol (MEGACE) 400 MG/10ML suspension Take 10 mLs (400 mg total) by mouth 2 (two) times daily. 240 mL 0 06/11/2023   Multiple Vitamin (MULTIVITAMIN WITH MINERALS) TABS tablet Take 1 tablet by mouth daily. 30 tablet 3 06/14/2023   TRELEGY ELLIPTA 100-62.5-25 MCG/ACT AEPB Inhale 1 puff into the lungs daily.   06/17/2023   warfarin (COUMADIN) 5 MG tablet Take 1 tablet (5 mg total) by mouth daily at 4 PM. 30 tablet 0 06/14/2023    Assessment: Pharmacy consulted to dose heparin and warfarin for DVT treatment in this 77 year old male admitted with sepsis, PNA.   Pt was on warfarin 5 mg PO daily PTA  Goal of Therapy:  HL :  0.3 -0.7  Monitor platelets by anticoagulation protocol: Yes   8/2 @2151  HL = 0.49, therapeutic at 1750 un/hr 8/3 @0714  HL = 0.50, therapeutic x 2  Plan: ---No bolus per vascular surgery ---Continue heparin infusion rate at 1750  units/hr ---Check HL daily while on heparin ---Continue to monitor H&H and platelets  Anelisse Jacobson Rodriguez-Guzman PharmD, BCPS 07/01/2023 8:23 AM

## 2023-07-01 NOTE — Progress Notes (Signed)
[facility]  Daily Progress Note   Subjective  -   He is on less oxygen today.  He has had a fair amount of sputum production.  He feels less SOB than yesterday but still SOB.  No groin complaints.  Objective Vitals:   07/01/23 0800 07/01/23 0900 07/01/23 1000 07/01/23 1100  BP: (!) 155/86 (!) 180/106 (!) 143/92 (!) 154/78  Pulse: 96 (!) 116 (!) 106 (!) 106  Resp: (!) 21 (!) 23 20 19   Temp: 97.9 F (36.6 C)     TempSrc: Axillary     SpO2: 100% 98% 100% 100%  Weight:      Height:        Intake/Output Summary (Last 24 hours) at 07/01/2023 1125 Last data filed at 07/01/2023 0102 Gross per 24 hour  Intake 383.15 ml  Output 725 ml  Net -341.85 ml    PULM  Tachypnea with ronchi CV  RRR tachy. VASC  Right femoral access site dressing removed and looks perfect.  Laboratory CBC    Component Value Date/Time   WBC 24.1 (H) 07/01/2023 0714   HGB 10.6 (L) 07/01/2023 0714   HCT 33.2 (L) 07/01/2023 0714   PLT 581 (H) 07/01/2023 0714    BMET    Component Value Date/Time   NA 138 07/01/2023 0716   K 4.0 07/01/2023 0716   CL 102 07/01/2023 0716   CO2 28 07/01/2023 0716   GLUCOSE 113 (H) 07/01/2023 0716   BUN 12 07/01/2023 0716   CREATININE 0.49 (L) 07/01/2023 0716   CALCIUM 8.0 (L) 07/01/2023 0716   GFRNONAA >60 07/01/2023 0716    Assessment/Planning: POD #1 s/p pulmonary artery thrombectomy (Right only) and IVC filter placement.  Overall he thinks his respiratory condition is improved.  On less oxygen and feels he is working less but still has distress.  Access site for pulmonary thrombectomy and IVC filter is fine.  Therapeutic on heparin.  Nothing to add.  Son-in-Law at bedside and updated and no additional requests.   Iline Oven  07/01/2023, 11:25 AM

## 2023-07-01 NOTE — Progress Notes (Signed)
Daily Progress Note   Patient Name: Jorge Rosales       Date: 07/01/2023 DOB: 18-Oct-1946  Age: 77 y.o. MRN#: [id number] Attending Physician: Delfino Lovett, MD Primary Care Physician: [facility] Admit Date: 06/06/2023  Reason for Consultation/Follow-up: Establishing goals of care  HPI/Brief Hospital Review: History provided per chart review and bedside patient report. Patient was recently admitted 06/17/23-06/23/23 for treatment of a LUL pneumonia and was discharged to [facility] for rehab. Patient admits to continued symptoms post discharge including progressively worsening dyspnea and a persistent productive cough with brown sputum. He denies chest pain, palpitations, fever/chills, nausea/vomiting/ abdominal pain/ diarrhea, signs of bleeding, seizures or LOC. He does report new BLE edema over the past 2 days. He recently had a supra therapeutic INR of 12, but the patient reported that his Coumadin was never stopped completely just reduced. He has been on 3-4 L [place] since discharging to rehab. EMS reported initial SpO2 at 79% on 4 L [place] they administered 2 g Mg, 1 duo neb and 125 mg of solumedrol.  Palliative Medicine consulted for assisting with goals of care conversations.  Subjective: Extensive chart review has been completed prior to meeting patient including labs, vital signs, imaging, progress notes, orders, and available advanced directive documents from current and previous encounters.    Visited with [name] at his bedside, initially during visit [name] remain on bipap and unable to engage in conversations. Daughter-Jorge Rosales at bedside.  Jorge Rosales shares emotional strain from yesterday and conversations had with her dad. 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 She shares from a clinical standpoint she understands the severity of her dad's illness and her wishes are to avoid resuscitation/intubation. Contradictory to that she is struggling with acceptance of this from a daughter standpoint. She remains hopeful for recovers, reviewed blood gas results from overnight and clinically she feels she is seeing small improvements. She feels the discomfort [name] is having is well managed with Morphine as needed.   Jorge Rosales shares overnight and earlier today her dad has remained clear in that his wishes are to remain Full Code and would desire intubation if needed.  During the end of our conversations, [name] requesting to remove bipap, transitioned to HFNC-tolerating well. He shares  he has been able to listen in to our conversations, he does not have any questions and at this time does not wish to further engage in GOC conversations as he remains clear in his wishes.  Answered and addressed all questions and concerns. PMT to continue to follow for ongoing needs and support.  Care plan was discussed with primary team and nursing staff.  Thank you for allowing the Palliative Medicine Team to assist in the care of this patient.  Total time:  50 minutes  Time spent includes: Detailed review of medical records (labs, imaging, vital signs), medically appropriate exam (mental status, respiratory, cardiac, skin), discussed with treatment team, counseling and educating patient, family and staff, documenting clinical information, medication management and coordination of care.  Leeanne Deed, DNP, AGNP-C Palliative Medicine   Please contact Palliative Medicine Team phone at [phone] for questions and concerns.

## 2023-07-01 NOTE — Progress Notes (Incomplete)
2232 - Pt called out regarding continued increased WOB and SOB. Continues to use accessory muscles. HR 110's-120s, RR 20's, SpO2 >95% on BiPAP, BP WDL. Scheduled nebs, PRN Morphine x2 and Ativan x1 given per EMAR/orders. Pt reports minimal relief from medications. Provider paged to notify. Awaiting response. Pt's daughter remains at bedside, call bell within reach.     2345 - PRN Ativan and Morphine administered. Pt noted to have an ease in his WOB and reports that breathing felt better at the moment.

## 2023-07-01 NOTE — Plan of Care (Signed)
  Problem: Fluid Volume: Goal: Hemodynamic stability will improve Outcome: Progressing   Problem: Clinical Measurements: Goal: Diagnostic test results will improve Outcome: Progressing Goal: Signs and symptoms of infection will decrease Outcome: Progressing   Problem: Respiratory: Goal: Ability to maintain adequate ventilation will improve Outcome: Progressing   Problem: Activity: Goal: Ability to tolerate increased activity will improve Outcome: Progressing   Problem: Clinical Measurements: Goal: Ability to maintain a body temperature in the normal range will improve Outcome: Progressing   Problem: Respiratory: Goal: Ability to maintain adequate ventilation will improve Outcome: Progressing Goal: Ability to maintain a clear airway will improve Outcome: Progressing   Problem: Education: Goal: Knowledge of General Education information will improve Description: Including pain rating scale, medication(s)/side effects and non-pharmacologic comfort measures Outcome: Progressing   Problem: Health Behavior/Discharge Planning: Goal: Ability to manage health-related needs will improve Outcome: Progressing   Problem: Clinical Measurements: Goal: Ability to maintain clinical measurements within normal limits will improve Outcome: Progressing Goal: Will remain free from infection Outcome: Progressing Goal: Diagnostic test results will improve Outcome: Progressing Goal: Respiratory complications will improve Outcome: Progressing Goal: Cardiovascular complication will be avoided Outcome: Progressing   Problem: Activity: Goal: Risk for activity intolerance will decrease Outcome: Progressing   Problem: Nutrition: Goal: Adequate nutrition will be maintained Outcome: Progressing   Problem: Coping: Goal: Level of anxiety will decrease Outcome: Progressing   Problem: Elimination: Goal: Will not experience complications related to bowel motility Outcome: Progressing Goal:  Will not experience complications related to urinary retention Outcome: Progressing   Problem: Pain Managment: Goal: General experience of comfort will improve Outcome: Progressing   Problem: Safety: Goal: Ability to remain free from injury will improve Outcome: Progressing   Problem: Skin Integrity: Goal: Risk for impaired skin integrity will decrease Outcome: Progressing   Pt reports good day and able to maintain off BiPAP majority of the day. At shift with increased SOB and WOB requirng pt to be placed back on BiPAP. Pt also reports increased anxiety. Pt and pt's family educated in regards to pt being more BiPAP dependent tonight after being off the BiPAP for so long today and his body being tired due to disease process. Frequent education provided to pt and pt's family.

## 2023-07-02 DIAGNOSIS — J9621 Acute and chronic respiratory failure with hypoxia: Secondary | ICD-10-CM | POA: Diagnosis not present

## 2023-07-02 DIAGNOSIS — A419 Sepsis, unspecified organism: Secondary | ICD-10-CM | POA: Diagnosis not present

## 2023-07-02 DIAGNOSIS — Z86711 Personal history of pulmonary embolism: Secondary | ICD-10-CM | POA: Diagnosis not present

## 2023-07-02 DIAGNOSIS — J9601 Acute respiratory failure with hypoxia: Secondary | ICD-10-CM | POA: Diagnosis not present

## 2023-07-02 DIAGNOSIS — I2699 Other pulmonary embolism without acute cor pulmonale: Secondary | ICD-10-CM | POA: Diagnosis not present

## 2023-07-02 DIAGNOSIS — J449 Chronic obstructive pulmonary disease, unspecified: Secondary | ICD-10-CM | POA: Diagnosis not present

## 2023-07-02 DIAGNOSIS — J189 Pneumonia, unspecified organism: Secondary | ICD-10-CM | POA: Diagnosis not present

## 2023-07-02 DIAGNOSIS — J851 Abscess of lung with pneumonia: Secondary | ICD-10-CM | POA: Diagnosis not present

## 2023-07-02 LAB — BASIC METABOLIC PANEL WITH GFR
Anion gap: 11 (ref 5–15)
BUN: 11 mg/dL (ref 8–23)
CO2: 28 mmol/L (ref 22–32)
Calcium: 8 mg/dL — ABNORMAL LOW (ref 8.9–10.3)
Chloride: 102 mmol/L (ref 98–111)
Creatinine, Ser: 0.55 mg/dL — ABNORMAL LOW (ref 0.61–1.24)
GFR, Estimated: >60 mL/min (ref >60)
Glucose, Bld: 93 mg/dL (ref 70–99)
Potassium: 3.7 mmol/L (ref 3.5–5.1)
Sodium: 141 mmol/L (ref 135–145)

## 2023-07-02 LAB — HEPARIN LEVEL (UNFRACTIONATED)
Heparin Unfractionated: 0.26 [IU]/mL — ABNORMAL LOW (ref 0.30–0.70)
Heparin Unfractionated: 0.37 IU/mL (ref 0.30–0.70)

## 2023-07-02 LAB — CBC
HCT: 33.2 % — ABNORMAL LOW (ref 39.0–52.0)
Hemoglobin: 10.5 g/dL — ABNORMAL LOW (ref 13.0–17.0)
MCH: 30.8 pg (ref 26.0–34.0)
MCHC: 31.6 g/dL (ref 30.0–36.0)
MCV: 97.4 fL (ref 80.0–100.0)
Platelets: 506 10*3/uL — ABNORMAL HIGH (ref 150–400)
RBC: 3.41 MIL/uL — ABNORMAL LOW (ref 4.22–5.81)
RDW: 12.9 % (ref 11.5–15.5)
WBC: 23.6 10*3/uL — ABNORMAL HIGH (ref 4.0–10.5)
nRBC: 0 % (ref 0.0–0.2)

## 2023-07-02 LAB — RENAL FUNCTION PANEL
Albumin: 2.2 g/dL — ABNORMAL LOW (ref 3.5–5.0)
Anion gap: 9 (ref 5–15)
BUN: 10 mg/dL (ref 8–23)
CO2: 30 mmol/L (ref 22–32)
Calcium: 8.1 mg/dL — ABNORMAL LOW (ref 8.9–10.3)
Chloride: 101 mmol/L (ref 98–111)
Creatinine, Ser: 0.36 mg/dL — ABNORMAL LOW (ref 0.61–1.24)
GFR, Estimated: >60 mL/min (ref >60)
Glucose, Bld: 87 mg/dL (ref 70–99)
Phosphorus: 2.3 mg/dL — ABNORMAL LOW (ref 2.5–4.6)
Potassium: 3.6 mmol/L (ref 3.5–5.1)
Sodium: 140 mmol/L (ref 135–145)

## 2023-07-02 MED ORDER — IPRATROPIUM-ALBUTEROL 0.5-2.5 (3) MG/3ML IN SOLN
3.0000 mL | Freq: Four times a day (QID) | RESPIRATORY_TRACT | Status: DC
  Administered 2023-07-02 – 2023-07-03 (×2): 3 mL via RESPIRATORY_TRACT
  Filled 2023-07-02 (×3): qty 3

## 2023-07-02 MED ORDER — MORPHINE SULFATE (PF) 2 MG/ML IV SOLN
1.0000 mg | INTRAVENOUS | Status: DC | PRN
  Administered 2023-07-02 – 2023-07-03 (×9): 1 mg via INTRAVENOUS
  Filled 2023-07-02 (×9): qty 1

## 2023-07-02 MED ORDER — DEXMEDETOMIDINE HCL IN NACL 400 MCG/100ML IV SOLN
0.0000 ug/kg/h | INTRAVENOUS | Status: DC
  Administered 2023-07-02: 0.4 ug/kg/h via INTRAVENOUS
  Administered 2023-07-03: 0.7 ug/kg/h via INTRAVENOUS
  Administered 2023-07-03: 1 ug/kg/h via INTRAVENOUS
  Administered 2023-07-03: 0.8 ug/kg/h via INTRAVENOUS
  Administered 2023-07-04 (×2): 1.2 ug/kg/h via INTRAVENOUS
  Filled 2023-07-02 (×6): qty 100

## 2023-07-02 MED ORDER — LORAZEPAM 2 MG/ML IJ SOLN
1.0000 mg | INTRAMUSCULAR | Status: DC | PRN
  Administered 2023-07-02 – 2023-07-03 (×5): 1 mg via INTRAVENOUS
  Filled 2023-07-02 (×5): qty 1

## 2023-07-02 MED ORDER — METOPROLOL TARTRATE 5 MG/5ML IV SOLN
5.0000 mg | Freq: Two times a day (BID) | INTRAVENOUS | Status: DC
  Administered 2023-07-02 (×2): 5 mg via INTRAVENOUS
  Filled 2023-07-02 (×2): qty 5

## 2023-07-02 MED ORDER — HEPARIN BOLUS VIA INFUSION
800.0000 [IU] | Freq: Once | INTRAVENOUS | Status: AC
  Administered 2023-07-02: 800 [IU] via INTRAVENOUS
  Filled 2023-07-02: qty 800

## 2023-07-02 MED ORDER — HEPARIN (PORCINE) 25000 UT/250ML-% IV SOLN
1850.0000 [IU]/h | INTRAVENOUS | Status: DC
  Administered 2023-07-03: 1850 [IU]/h via INTRAVENOUS

## 2023-07-02 MED ORDER — FLUCONAZOLE IN SODIUM CHLORIDE 400-0.9 MG/200ML-% IV SOLN
800.0000 mg | Freq: Once | INTRAVENOUS | Status: AC
  Administered 2023-07-02: 800 mg via INTRAVENOUS
  Filled 2023-07-02: qty 400

## 2023-07-02 MED ORDER — SODIUM CHLORIDE 0.9 % IV SOLN: INTRAVENOUS | Status: AC

## 2023-07-02 NOTE — Progress Notes (Signed)
[facility]  Daily Progress Note   Subjective  -   On Bipap and grunts but not interactive.  Family at bedside.  Objective Vitals:   07/02/23 1000 07/02/23 1200 07/02/23 1300 07/02/23 1315  BP: (!) 137/91 (!) 140/79 (!) 157/85   Pulse: 91 87 90 83  Resp: (!) 23 (!) 22 (!) 21 19  Temp:  97.8 F (36.6 C)    TempSrc:  Axillary    SpO2: 100% 100% 100% 100%  Weight:      Height:        Intake/Output Summary (Last 24 hours) at 07/02/2023 1330 Last data filed at 07/02/2023 1157 Gross per 24 hour  Intake 614.22 ml  Output 100 ml  Net 514.22 ml    PULM  CTAB CV  RRR VASC  Right groin is fine.  Feet cool but no mottled.  Calves warm.  Laboratory CBC    Component Value Date/Time   WBC 23.6 (H) 07/02/2023 0627   HGB 10.5 (L) 07/02/2023 0627   HCT 33.2 (L) 07/02/2023 0627   PLT 506 (H) 07/02/2023 0627    BMET    Component Value Date/Time   NA 140 07/02/2023 0627   NA 141 07/02/2023 0627   K 3.6 07/02/2023 0627   K 3.7 07/02/2023 0627   CL 101 07/02/2023 0627   CL 102 07/02/2023 0627   CO2 30 07/02/2023 0627   CO2 28 07/02/2023 0627   GLUCOSE 87 07/02/2023 0627   GLUCOSE 93 07/02/2023 0627   BUN 10 07/02/2023 0627   BUN 11 07/02/2023 0627   CREATININE 0.36 (L) 07/02/2023 0627   CREATININE 0.55 (L) 07/02/2023 0627   CALCIUM 8.1 (L) 07/02/2023 0627   CALCIUM 8.0 (L) 07/02/2023 0627   GFRNONAA >60 07/02/2023 0627   GFRNONAA >60 07/02/2023 8295    Assessment/Planning: POD #2 s/p right pulmonary thrombectomy and IVC filter  He is critically ill.  No complications from Pulmonary Thrombectomy and IVC filter placement.  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  I do not think this is embolization or acute arterial occlusion and he is on heparin.  He is not a candidate for intervention in any case as well.  I have attempted to reassure the family and staff of this assessment.   Iline Oven  07/02/2023, 1:30  PM

## 2023-07-02 NOTE — Progress Notes (Addendum)
ANTICOAGULATION CONSULT NOTE  Pharmacy Consult for Warfarin, Heparin  Indication: DVT  Allergies  Allergen Reactions   Wound Dressing Adhesive Rash    Rash with blisters; band-aids espescially. Per [facility] Clinical Summary   Latex Rash    Per [facility] Clinical Summary    Patient Measurements: Height: 5\' 10"  (177.8 cm) Weight: 56.9 kg (125 lb 7.1 oz) IBW/kg (Calculated) : 73 Heparin Dosing Weight: 58 kg   Vital Signs: Temp: 97.4 F (36.3 C) (08/04 1557) Temp Source: Axillary (08/04 1557) BP: 159/78 (08/04 1600) Pulse Rate: 84 (08/04 1600)  Labs: Recent Labs    07/18/2023 0448 07/13/2023 2153 07/01/23 0714 07/01/23 0716 07/02/23 0627 07/02/23 1638  HGB 11.6*  --  10.6*  --  10.5*  --   HCT 35.9*  --  33.2*  --  33.2*  --   PLT 624*  --  581*  --  506*  --   HEPARINUNFRC 0.17*   < > 0.50  --  0.26* 0.37  CREATININE 0.52*  --  0.52* 0.49* 0.55*  0.36*  --    < > = values in this interval not displayed.    Estimated Creatinine Clearance: 63.2 mL/min (A) (by C-G formula based on SCr of 0.36 mg/dL (L)).   Medical History: Past Medical History:  Diagnosis Date   Anticoagulant long-term use    coumadin   Cataracts, both eyes    Cavitary lesion of lung 2015   likely necrotic process w residual cavity. 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  On long-term warfarin   Malignant neoplasm of skin    On home oxygen therapy    4-5L [place]   Osteoarthritis of both knees    Protein-calorie malnutrition, severe (HCC)    Seizure disorder (HCC) 1982   He had a "drop attack clinical diagnosed with seizure disorder.  Started on Tegretol.  No further episodes.   Stage 3 severe COPD by GOLD classification (HCC)     Medications:  Medications  Prior to Admission  Medication Sig Dispense Refill Last Dose   albuterol (VENTOLIN HFA) 108 (90 Base) MCG/ACT inhaler Inhale 2 puffs into the lungs every 4 (four) hours as needed.   06/15/2023   carbamazepine (TEGRETOL) 200 MG tablet Take 1 tablet by mouth 3 (three) times daily.   06/06/2023   DULoxetine (CYMBALTA) 30 MG capsule Take 30 mg by mouth daily.   06/20/2023   feeding supplement (ENSURE ENLIVE / ENSURE PLUS) LIQD Take 237 mLs by mouth 3 (three) times daily between meals. 237 mL 12 06/26/2023   ipratropium-albuterol (DUONEB) 0.5-2.5 (3) MG/3ML SOLN Take 3 mLs by nebulization every 6 (six) hours as needed. 360 mL 3 06/03/2023   megestrol (MEGACE) 400 MG/10ML suspension Take 10 mLs (400 mg total) by mouth 2 (two) times daily. 240 mL 0 06/06/2023   Multiple Vitamin (MULTIVITAMIN WITH MINERALS) TABS tablet Take 1 tablet by mouth daily. 30 tablet 3 05/29/2023   TRELEGY ELLIPTA 100-62.5-25 MCG/ACT AEPB Inhale 1 puff into the lungs daily.   06/25/2023   warfarin (COUMADIN) 5 MG tablet Take 1 tablet (5 mg total) by mouth daily at 4 PM. 30 tablet 0 06/15/2023   Assessment: Pharmacy consulted to dose  heparin and warfarin for DVT treatment in this 77 year old male admitted with sepsis, PNA.   Pt was on warfarin 5 mg PO daily PTA  Goal of Therapy:  HL :  0.3 -0.7  Monitor platelets by anticoagulation protocol: Yes   8/2 @2151  HL = 0.49, therapeutic at 1750 un/hr 8/3 @0714  HL = 0.50, therapeutic x 2 8/4@0627  HL = 0.26, subtherapeutic 8/4@1638  HL = 0.37, therapeutic  Plan: ---HL is therapeutic x 1 ---Continue heparin infusion at rate of 1850 units/hr ---Check HL in 8 hours to confirm ---Continue to monitor H&H and platelets  Barrie Folk, PharmD 07/02/2023 5:08 PM  ________________________________________________________________ 07/02/23 1834 Update:  Per RN, patient experiencing blood in urine (had recent foley placement, likely trauma related). Per Dr. Sherryll Burger, will hold heparin infusion for  12 hours.  Plan: Hold heparin infusion tonight Restart heparin infusion on 07/03/23 at 0630 Check 8 hour heparin level Monitor daily CBC and signs/symptoms of bleeding  Thank you for involving pharmacy in this patient's care.   Rockwell Alexandria, PharmD Clinical Pharmacist 07/02/2023 6:36 PM

## 2023-07-02 NOTE — Progress Notes (Addendum)
Made dr. Sherryll Burger aware patient now had blood in his urine, currently on heparin drip at 18.5. per dr. Sherryll Burger hold heparin for 12 hours.

## 2023-07-02 NOTE — Progress Notes (Signed)
ANTICOAGULATION CONSULT NOTE  Pharmacy Consult for Warfarin, Heparin  Indication: DVT  Allergies  Allergen Reactions   Wound Dressing Adhesive Rash    Rash with blisters; band-aids espescially. Per [facility] Clinical Summary   Latex Rash    Per [facility] Clinical Summary    Patient Measurements: Height: 5\' 10"  (177.8 cm) Weight: 56.9 kg (125 lb 7.1 oz) IBW/kg (Calculated) : 73 Heparin Dosing Weight: 58 kg   Vital Signs: Temp: 98.3 F (36.8 C) (08/04 0400) Temp Source: Axillary (08/04 0400) BP: 139/90 (08/04 0600) Pulse Rate: 100 (08/04 0600)  Labs: Recent Labs    06/30/23 0448 06/30/23 2153 07/01/23 0714 07/01/23 0716 07/02/23 0627  HGB 11.6*  --  10.6*  --  10.5*  HCT 35.9*  --  33.2*  --  33.2*  PLT 624*  --  581*  --  506*  HEPARINUNFRC 0.17* 0.14* 0.50  --  0.26*  CREATININE 0.52*  --  0.52* 0.49* 0.36*    Estimated Creatinine Clearance: 63.2 mL/min (A) (by C-G formula based on SCr of 0.36 mg/dL (L)).   Medical History: Past Medical History:  Diagnosis Date   Anticoagulant long-term use    coumadin   Cataracts, both eyes    Cavitary lesion of lung 2015   likely necrotic process w residual cavity. 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  On long-term warfarin   Malignant neoplasm of skin    On home oxygen therapy    4-5L [place]   Osteoarthritis of both knees    Protein-calorie malnutrition, severe (HCC)    Seizure disorder (HCC) 1982   He had a "drop attack clinical diagnosed with seizure disorder.  Started on Tegretol.  No further episodes.   Stage 3 severe COPD by GOLD classification (HCC)     Medications:  Medications Prior to Admission  Medication Sig Dispense Refill Last Dose   albuterol (VENTOLIN HFA) 108  (90 Base) MCG/ACT inhaler Inhale 2 puffs into the lungs every 4 (four) hours as needed.   28-Jul-2023   carbamazepine (TEGRETOL) 200 MG tablet Take 1 tablet by mouth 3 (three) times daily.   2023/07/28   DULoxetine (CYMBALTA) 30 MG capsule Take 30 mg by mouth daily.   07/28/2023   feeding supplement (ENSURE ENLIVE / ENSURE PLUS) LIQD Take 237 mLs by mouth 3 (three) times daily between meals. 237 mL 12 2023/07/28   ipratropium-albuterol (DUONEB) 0.5-2.5 (3) MG/3ML SOLN Take 3 mLs by nebulization every 6 (six) hours as needed. 360 mL 3 2023/07/28   megestrol (MEGACE) 400 MG/10ML suspension Take 10 mLs (400 mg total) by mouth 2 (two) times daily. 240 mL 0 07-28-2023   Multiple Vitamin (MULTIVITAMIN WITH MINERALS) TABS tablet Take 1 tablet by mouth daily. 30 tablet 3 07-28-23   TRELEGY ELLIPTA 100-62.5-25 MCG/ACT AEPB Inhale 1 puff into the lungs daily.   07-28-23   warfarin (COUMADIN) 5 MG tablet Take 1 tablet (5 mg total) by mouth daily at 4 PM. 30 tablet 0 July 28, 2023    Assessment: Pharmacy consulted to dose heparin and warfarin for DVT treatment in this 77 year old male admitted with sepsis, PNA.   Pt was on warfarin 5 mg PO daily PTA  Goal of  Therapy:  HL :  0.3 -0.7  Monitor platelets by anticoagulation protocol: Yes   8/2 @2151  HL = 0.49, therapeutic at 1750 un/hr 8/3 @0714  HL = 0.50, therapeutic x 2 8/4@0627  HL = 0.26, subtherapeutic  Plan: ---Heparin 800 unit IV bolus x 1, then ---Increase heparin infusion rate to 1850  units/hr ---Check HL 8hrs after rate change on heparin ---Continue to monitor H&H and platelets  Anureet Bruington Rodriguez-Guzman PharmD, BCPS 07/02/2023 7:39 AM

## 2023-07-02 NOTE — Progress Notes (Addendum)
[place] at [facility]   PATIENT NAME: Jorge Rosales    MR#:  [id number]  PCP: [facility]  DATE OF BIRTH:  1946-03-11  SUBJECTIVE:  CHIEF COMPLAINT:   Chief Complaint  Patient presents with   Shortness of Breath  remains on BiPAP, SOB +, sleepy and trying to get his BiPAP off. Daughter at bedside REVIEW OF SYSTEMS:  Review of Systems  Reason unable to perform ROS: On BiPAP.   DRUG ALLERGIES:   Allergies  Allergen Reactions   Wound Dressing Adhesive Rash    Rash with blisters; band-aids espescially. Per [facility] Clinical Summary   Latex Rash    Per [facility] Clinical Summary   VITALS:  Blood pressure (!) 158/87, pulse 72, temperature (!) 97.4 F (36.3 C), temperature source Axillary, resp. rate (!) 25, height 5\' 10"  (1.778 m), weight 56.9 kg, SpO2 94%. PHYSICAL EXAMINATION:  Physical Exam Vitals and nursing note reviewed.  Constitutional:      General: He is in acute distress.     Appearance: He is ill-appearing.  HENT:     Head: Normocephalic and atraumatic.  Eyes:     Extraocular Movements: Extraocular movements intact.     Pupils: Pupils are equal, round, and reactive to light.  Cardiovascular:     Rate and Rhythm: Regular rhythm. Tachycardia present.  Pulmonary:     Effort: Tachypnea, accessory muscle usage and respiratory distress present.     Breath sounds: Examination of the right-lower field reveals decreased breath sounds and rhonchi. Examination of the left-lower field reveals decreased breath sounds and rhonchi. Decreased breath sounds and rhonchi present.  Abdominal:     General: Bowel sounds are normal.     Palpations: Abdomen is soft.  Musculoskeletal:        General: Normal range of motion.     Cervical back: Normal range of motion and neck supple.  Skin:    General: Skin is warm and dry.     Coloration: Skin is cyanotic.     Comments: He has mottling on the right side of the body also in the right great toe in the lower foot   Neurological:     General: No focal deficit present.     Mental Status: He is alert and oriented to person, place, and time.  Psychiatric:        Mood and Affect: Mood normal.        Behavior: Behavior normal.    LABORATORY PANEL:  Male CBC Recent Labs  Lab 07/02/23 0627  WBC 23.6*  HGB 10.5*  HCT 33.2*  PLT 506*   ------------------------------------------------------------------------------------------------------------------ Chemistries  Recent Labs  Lab 06/01/2023 1806 06/29/23 0837 07/13/2023 0448 07/01/23 0714 07/02/23 0627  NA 130*   < > 138   < > 141  140  K 3.9   < > 3.9   < > 3.7  3.6  CL 96*   < > 102   < > 102  101  CO2 28   < > 28   < > 28  30  GLUCOSE 187*   < > 98   < > 93  87  BUN 16   < > 12   < > 11  10  CREATININE 0.54*   < > 0.52*   < > 0.55*  0.36*  CALCIUM 7.7*   < > 7.9*   < > 8.0*  8.1*  MG  --   --  2.4  --   --  AST 20  --   --   --   --   ALT 17  --   --   --   --   ALKPHOS 125  --   --   --   --   BILITOT 0.4  --   --   --   --    < > = values in this interval not displayed.   MEDICATIONS:  Scheduled Meds:  budesonide (PULMICORT) nebulizer solution  0.25 mg Nebulization BID   Chlorhexidine Gluconate Cloth  6 each Topical Daily   docusate sodium  100 mg Oral BID   DULoxetine  30 mg Oral Daily   ipratropium-albuterol  3 mL Nebulization Once   ipratropium-albuterol  3 mL Nebulization Q6H   metoprolol tartrate  5 mg Intravenous Q12H   mouth rinse  15 mL Mouth Rinse 4 times per day   pantoprazole (PROTONIX) IV  40 mg Intravenous Q24H   sodium chloride flush  3 mL Intravenous Q12H   Continuous Infusions:  sodium chloride Stopped (07/02/23 1556)   dexmedetomidine (PRECEDEX) IV infusion 0.4 mcg/kg/hr (07/02/23 1829)   [START ON 07/03/2023] heparin     piperacillin-tazobactam (ZOSYN)  IV 3.375 g (07/02/23 1557)   RADIOLOGY:  No results found. ASSESSMENT AND PLAN:  77 y.o. male with medical history significant for COPD on home O2  at 2 L, former smoker, pulmonary hypertension, PE in 2022 on warfarin anticoagulation admitted for respi failure  Acute on chronic hypoxic respiratory failure Acute submassive PE Pneumonia with possible lung abscess COPD on chronic oxygen Former tobacco use Off BiPAP -> 10 L HFNC Continue IV Zosyn for possible pneumonia.  Discontinue vancomycin as MRSA negative Appreciate ID input.  Checking AFB sputum to rule out atypical mycobacteria/TB.  Continue airborne isolation Continue nebs and bronchodilators Pulmo reeval today.   Hematuria - Coude Cathetar placed for urinary retention. Started IVFs as not eating/drinking much - likely due to foley related trauma. Hold heparin overnight (for 12 hrs). Should   Acute submassive PE Subacute DVT in bilateral lower extremity Status post bilateral pulmonary thrombectomy and IVC filter insertion by vascular surgery Continue heparin drip Echo shows normal LV systolic function with a EF of 60 to 65%.  Mildly elevated pulmonary artery systolic pressure Will consider transition to DOAC instead of Coumadin when transition from heparin  Unintentional weight loss 40 pounds in last 6 months Patient was functional and driving before 6 1 but has had significant clinical and functional decline in last few months especially since last hospitalization.  Palliative care following for goals of care discussion Pending AFB sputum  Sputum growing Yeast - one dose of Diflucan  History of seizure He had been on carbamazepine since 80s and had not seen neurology or not reported any further seizure Will stop carbamazepine at this time as it does have significant interaction with blood thinners  Goals of care Palliative care consult.  Overall poor prognosis Continue low-dose morphine for air hunger and Ativan IV 1 mg as needed for agitation Added precedex low dose for air hunger after d/w Intensivist   Body mass index is 18 kg/m.  Net IO Since Admission:  2,161.89 mL [07/02/23 2106]      LOS: 4 days   Consultants: ID Vascular surgery Intensivist Palliative care    Antibiotics: IV Zosyn  Status is: Inpatient 3-4   DVT prophylaxis:   Heparin drip     He remains critically sick with high risk for multiorgan failure, acute cardiorespiratory failure  and death.  Monitor in stepdown for now    Family Communication: Updated daughter at bedside   All the records are reviewed and case discussed with Nursing and [facility] team. Management plans discussed with the patient, family and they are in agreement.  CODE STATUS: Full Code Level of care: Stepdown  TOTAL TIME TAKING CARE OF THIS PATIENT: 35 minutes.   More than 50% of the time was spent in counseling/coordination of care: YES  POSSIBLE D/C IN 3-4 DAYS, DEPENDING ON CLINICAL CONDITION.   Delfino Lovett M.D on 07/02/2023 at 9:06 PM  [facility]   CC: Primary care physician; [facility]  Note: This dictation was prepared with Dragon dictation along with smaller phrase technology. Any transcriptional errors that result from this process are unintentional.

## 2023-07-02 NOTE — Progress Notes (Signed)
Daily Progress Note   Patient Name: Jorge Rosales       Date: 07/02/2023 DOB: Feb 14, 1946  Age: 77 y.o. MRN#: [id number] Attending Physician: Delfino Lovett, MD Primary Care Physician: Jerrilyn Cairo Primary Care Admit Date: 06/22/2023  Reason for Consultation/Follow-up: Establishing goals of care  HPI/Brief Hospital Review: History provided per chart review and bedside patient report. Patient was recently admitted 06/17/23-06/23/23 for treatment of a LUL pneumonia and was discharged to [facility] for rehab. Patient admits to continued symptoms post discharge including progressively worsening dyspnea and a persistent productive cough with brown sputum. He denies chest pain, palpitations, fever/chills, nausea/vomiting/ abdominal pain/ diarrhea, signs of bleeding, seizures or LOC. He does report new BLE edema over the past 2 days. He recently had a supra therapeutic INR of 12, but the patient reported that his Coumadin was never stopped completely just reduced. He has been on 3-4 L [place] since discharging to rehab. EMS reported initial SpO2 at 79% on 4 L [place] they administered 2 g Mg, 1 duo neb and 125 mg of solumedrol.  Underwent thrombectomy on 8/2 and IVC filter placement-since has continued to struggle with SHOB, increased WOB, air hunger requiring bipap rather frequently (more often than not requiring) able to transition to HFNC for short periods of time and requiring frequent doses of Morphine as needed to alleviate SHOB and air hunger symptoms   Palliative Medicine consulted for assisting with goals of care conversations.   Subjective: Extensive chart review has been completed prior to meeting patient including labs, vital signs, imaging, progress notes, orders, and available advanced directive documents from  current and previous encounters.    Visited with Mr. Bearce at his bedside, BIPAP in place, awakens when name is spoken but drifts back off to sleep without redirection. Abel to transition off BIPAP during my visit but eyes mostly remained closed, not alert or able to engage enough to have GOC conversations.  Visited with daughter-Lindsay and her spouse at bedside. Lillia Abed shares overnight events, episode of worsening respiratory distress again requiring BIPAP and medications. Lillia Abed shares GOC conversations have not been had since our visit yesterday, it does not seem Mr. Simmering has been awake or alert enough during that time to appropriately engage.  Family continues to struggle with decision making-Mr. Liberati last engagement in GOC conversations shares he wishes to  remain Full Code-Full Scope and would pursue mechanical ventilation if necessary. Family shares their concerns related to Mr. Reierson fully understanding medical decisions with that complexity. Family struggling emotionally related to preparing for next steps, unsure on how much time should be provided and continuing with current treatment until reaching a time when treatment/interventions can be deemed without success.  We discussed the increased use of Morphine and possible side effects such as drowsiness, sedation and lowered pulmonary drive for which Lillia Abed acknowledges awareness. Shared with frequent utilization of IV Morphine there is likely a component of suffering as related to Mr. Dahan feeling of suffocating.  Family expressed their interest in speaking with pulmonology as they feel as a specialist group conversations can be had on clear and realistic expectations.  Arranged meeting with pulmonology team including Dr. Aundria Rud and Annabelle Harman, NP. Dr. Aundria Rud reviewed current medical condition and shared allowing for more time for healing. Recommendations made to initiate low dose Precedex infusion to possibly lower need for IV  morphine.  Answered and addressed all questions and concerns at this time. PMT to continue to follow for ongoing needs and support.   Palliative Care Assessment & Plan   Assessment/Recommendation/Plan  Ongoing GOC discussions needed, remains Full Code-Full Scope Hopeful Mr. Keating able to be more awake and alert and able to engage in GOC conversations  Care plan was discussed with nursing staff and ICU team.  Thank you for allowing the Palliative Medicine Team to assist in the care of this patient.  Total time:  50 minutes  Time spent includes: Detailed review of medical records (labs, imaging, vital signs), medically appropriate exam (mental status, respiratory, cardiac, skin), discussed with treatment team, counseling and educating patient, family and staff, documenting clinical information, medication management and coordination of care.  Leeanne Deed, DNP, AGNP-C Palliative Medicine   Please contact Palliative Medicine Team phone at [phone] for questions and concerns.

## 2023-07-02 NOTE — Progress Notes (Signed)
NAME:  Jorge Rosales, MRN:  [id number], DOB:  Jun 02, 1946, LOS: 4 ADMISSION DATE:  06/27/2023, CHIEF COMPLAINT:  Acute Hypoxic Respiratory Failure   History of Present Illness:   77 yo M presenting to [facility] ED from [facility] via EMS for evaluation of dyspnea.   History provided per chart review and bedside patient report. Patient was recently admitted 06/17/23-06/23/23 for treatment of a LUL pneumonia and was discharged to [facility] for rehab. Patient admits to continued symptoms post discharge including progressively worsening dyspnea and a persistent productive cough with brown sputum. He denies chest pain, palpitations, fever/chills, nausea/vomiting/ abdominal pain/ diarrhea, signs of bleeding, seizures or LOC. He does report new BLE edema over the past 2 days. He recently had a supra therapeutic INR of 12, but the patient reported that his Coumadin was never stopped completely just reduced. He has been on 3-4 L [place] since discharging to rehab. EMS reported initial SpO2 at 79% on 4 L [place] they administered 2 g Mg, 1 duo neb and 125 mg of solumedrol.   ED course: Upon arrival patient alert and oriented, with tachypnea and hypertension. He was transitioned to BIPAP support. Labs significant for hypoalbuminemia, leukocytosis without lactic acidosis and a mildly elevated BNP. Imaging concerning for lung abscess continued pneumonia and positive of acute submassive PE.  CXR 06/20/2023: Persistent dense consolidation within the left upper lobe, compatible with pneumonia. The patchy areas of airspace disease in the left perihilar region seen on prior chest x-ray demonstrate mild improvement. Stable emphysema CT angio chest PE 06/21/2023: Acute pulmonary emboli in the distal right main pulmonary artery extending into segmental and subsegmental branches of the right upper lobe. Additional segmental and subsegmental pulmonary emboli in the right lower lobe and right middle lobe. Positive for acute PE with CT evidence of right  heart strain (RV/LV Ratio = 1.3) consistent with at least submassive (intermediate risk) PE. The presence of right heart strain has been associated with an increased risk of morbidity and mortality. New air-fluid level in the left apex worrisome for abscess. Left upper lobe infiltrate otherwise appears unchanged. Lingular airspace disease has decreased, but there is some persistent ground-glass opacity in this region. Spiculated density in the left lower lobe is unchanged. Stable ground-glass opacity in the right upper lobe.  Echocardiogram 06/29/2023  1. Left ventricular ejection fraction, by estimation, is 60 to 65%. The  left ventricle has normal function. The left ventricle has no regional  wall motion abnormalities. Left ventricular diastolic parameters are  indeterminate.   2. Right ventricular systolic function is normal. The right ventricular  size is normal. There is mildly elevated pulmonary artery systolic  pressure. The estimated right ventricular systolic pressure is 41.0 mmHg.   3. The mitral valve is normal in structure. No evidence of mitral valve  regurgitation. No evidence of mitral stenosis.   4. Tricuspid valve regurgitation is mild to moderate.   5. The aortic valve has an indeterminant number of cusps. Aortic valve  regurgitation is not visualized. No aortic stenosis is present.   6. The inferior vena cava is normal in size with greater than 50%  respiratory variability, suggesting right atrial pressure of 3 mmHg.   Pertinent  Medical History  Seizure disorder- drop attacks COPD (previous 2 L [place] PRN > recently 3-4 L [place] continuously) Cataracts Former Tobacco [place] (quit 2015) Bilateral multiple subsegmental PE on warfarin (2022) Pulmonary HTN Depression  Interim History / Subjective:  Patient is sleeping and somnolent at the time of our examination.  He continued to express shortness of breath. He was on 8L via nasal cannula.  Objective   Blood pressure (!) 159/78,  pulse 84, temperature (!) 97.4 F (36.3 C), temperature source Axillary, resp. rate (!) 22, height 5\' 10"  (1.778 m), weight 56.9 kg, SpO2 99%.    FiO2 (%):  [45 %] 45 %   Intake/Output Summary (Last 24 hours) at 07/02/2023 1655 Last data filed at 07/02/2023 1630 Gross per 24 hour  Intake 1245.5 ml  Output 400 ml  Net 845.5 ml   Filed Weights   06/10/2023 1803 06/07/2023 2330 07/02/23 0600  Weight: 58 kg 55.9 kg 56.9 kg    Examination: Physical Exam Constitutional:      General: He is in acute distress.     Appearance: He is ill-appearing.  HENT:     Mouth/Throat:     Mouth: Mucous membranes are dry.  Pulmonary:     Breath sounds: No wheezing.     Comments: Decreased air entry bilaterally Abdominal:     Palpations: Abdomen is soft.  Musculoskeletal:     Right lower leg: No edema.     Left lower leg: No edema.  Neurological:     Mental Status: He is disoriented.      Assessment & Plan:   #Acute on Chronic Hypoxic Respiratory Failure #LUL pneumonia / lung abscess #Pulmonary Embolism  77 year old male with a history of seizure disorder, COPD, and prior pulmonary embolism (2022) who presented to the hospital on 06/17/2023 with LUL pneumonia treated with antibiotics. He was discharged to rehab last week on 06/23/2023 and presents back with worsening hypoxic respiratory failure. CT imaging is showing evolution of the LUL pneumonia with abscess development, in addition to the new finding of segmental pulmonary embolism in in the right pulmonary vasculature.  There is no pleural effusion or pneumothorax on initial CT to warrant the placement of a chest tube, and cotinued antibiotics for the abscess in consultation with infectious diseases is recommended. Repeat respiratory cultures (including AFB and fungal) were sent.   Patient developed a pulmonary embolus on coumadin therapy indicating failure of treatment and would consider switching to DOAC. Echocardiogram performed on 06/29/2023  showed normal RV function consistent with intermediate low risk PE per ERS 2019 guidelines (BNP elevation as biochemical marker, normal troponin). Despite that, he underwent thrombectomy and IVC filter placement on 07/27/2023. Patient's condition deteriorated with increased air hunger and worsening respiratory failure requiring alternating BiPAP and Hi-Flo nasal cannula. Palliative care is consulted and are actively involved in goals of care discussions.  Today, I have met with the patient's family (daughter and son-in-law) and we discussed his overall care trajectory. Explained that he is receiving supportive care and recommended consideration of low dose precedex for air-hunger and anxiety in lieu of frequent morphine and lorazepam.  -increase duo-nebs to every 4 hours -continue heparin gtt -consider low dose dexmedetomidine gtt (0.1-0.2) for anxiety and air hunger -continue with broad spectrum antibiotics with input from ID -recommend pulmonary toilet   Labs   CBC: Recent Labs  Lab 06/24/2023 1806 07/18/2023 0448 07/01/23 0714 07/02/23 0627  WBC 18.6* 23.5* 24.1* 23.6*  NEUTROABS 14.8*  --   --   --   HGB 12.0* 11.6* 10.6* 10.5*  HCT 37.3* 35.9* 33.2* 33.2*  MCV 94.7 94.7 97.6 97.4  PLT 506* 624* 581* 506*    Basic Metabolic Panel: Recent Labs  Lab 06/29/23 0837 07/15/2023 0448 07/01/23 0714 07/01/23 0716 07/02/23 0627  NA 133* 138 139  138 141  140  K 4.4 3.9 4.0 4.0 3.7  3.6  CL 98 102 103 102 102  101  CO2 25 28 28 28 28  30   GLUCOSE 127* 98 111* 113* 93  87  BUN 12 12 13 12 11  10   CREATININE 0.50* 0.52* 0.52* 0.49* 0.55*  0.36*  CALCIUM 7.8* 7.9* 8.1* 8.0* 8.0*  8.1*  MG  --  2.4  --   --   --   PHOS  --  3.2  --  3.1 2.3*   GFR: Estimated Creatinine Clearance: 63.2 mL/min (A) (by C-G formula based on SCr of 0.36 mg/dL (L)). Recent Labs  Lab 06/05/2023 1806 06/12/2023 2231 07/23/2023 0448 07/01/23 0714 07/02/23 0627  PROCALCITON 0.15  --  <0.10 0.24  --   WBC  18.6*  --  23.5* 24.1* 23.6*  LATICACIDVEN 1.6 1.4  --   --   --     Liver Function Tests: Recent Labs  Lab 06/05/2023 1806 07/21/2023 0448 07/01/23 0716 07/02/23 0627  AST 20  --   --   --   ALT 17  --   --   --   ALKPHOS 125  --   --   --   BILITOT 0.4  --   --   --   PROT 7.5  --   --   --   ALBUMIN 2.3* 2.3* 2.3* 2.2*   No results for input(s): "LIPASE", "AMYLASE" in the last 168 hours. No results for input(s): "AMMONIA" in the last 168 hours.  ABG    Component Value Date/Time   PHART 7.37 07/17/2023 1825   PCO2ART 49 (H) 07/09/2023 1825   PO2ART 91 07/26/2023 1825   HCO3 28.3 (H) 07/03/2023 1825   O2SAT 97.6 07/17/2023 1825     Coagulation Profile: Recent Labs  Lab 06/07/2023 1806  INR 1.9*    Cardiac Enzymes: No results for input(s): "CKTOTAL", "CKMB", "CKMBINDEX", "TROPONINI" in the last 168 hours.  HbA1C: No results found for: "HGBA1C"  CBG: Recent Labs  Lab 06/12/2023 2326  GLUCAP 143*    Review of Systems:   Unable to obtain  Past Medical History:  He,  has a past medical history of Anticoagulant long-term use, Cataracts, both eyes, Cavitary lesion of lung (2015), Chronic bronchitis (HCC), Debility, Depression, DVT, bilateral lower limbs (HCC), Former heavy tobacco smoker, History of colon surgery (11/28/1965), History of pulmonary embolism (05/15/2021), Malignant neoplasm of skin, On home oxygen therapy, Osteoarthritis of both knees, Protein-calorie malnutrition, severe (HCC), Seizure disorder (HCC) (1982), and Stage 3 severe COPD by GOLD classification (HCC).   Surgical History:   Past Surgical History:  Procedure Laterality Date   APPENDECTOMY     BIL hip replacement  11/29/2011   BOWEL RESECTION  11/28/1965   INGUINAL HERNIA REPAIR Bilateral      Social History:   reports that he has quit smoking. His smoking use included cigarettes. He started smoking about 58 years ago. He has a 58.6 pack-year smoking history. He does not have any smokeless  tobacco history on file. He reports current alcohol use. He reports that he does not use drugs.   Family History:  His family history includes Heart disease in his father; Lung cancer in his father.   Allergies Allergies  Allergen Reactions   Wound Dressing Adhesive Rash    Rash with blisters; band-aids espescially. Per [facility] Clinical Summary   Latex Rash    Per [facility] Clinical Summary  Home Medications  Prior to Admission medications   Medication Sig Start Date End Date Taking? Authorizing Provider  albuterol (VENTOLIN HFA) 108 (90 Base) MCG/ACT inhaler Inhale 2 puffs into the lungs every 4 (four) hours as needed. 12/01/22  Yes [provider]  carbamazepine (TEGRETOL) 200 MG tablet Take 1 tablet by mouth 3 (three) times daily. 09/28/22  Yes [provider]  DULoxetine (CYMBALTA) 30 MG capsule Take 30 mg by mouth daily.   Yes [provider]  feeding supplement (ENSURE ENLIVE / ENSURE PLUS) LIQD Take 237 mLs by mouth 3 (three) times daily between meals. 06/23/23  Yes Marcelino Duster, MD  ipratropium-albuterol (DUONEB) 0.5-2.5 (3) MG/3ML SOLN Take 3 mLs by nebulization every 6 (six) hours as needed. 06/23/23  Yes Marcelino Duster, MD  megestrol (MEGACE) 400 MG/10ML suspension Take 10 mLs (400 mg total) by mouth 2 (two) times daily. 06/23/23  Yes Marcelino Duster, MD  Multiple Vitamin (MULTIVITAMIN WITH MINERALS) TABS tablet Take 1 tablet by mouth daily. 06/24/23  Yes Sreeram, Lynne Logan, MD  TRELEGY ELLIPTA 100-62.5-25 MCG/ACT AEPB Inhale 1 puff into the lungs daily. 01/03/23  Yes [provider]  warfarin (COUMADIN) 5 MG tablet Take 1 tablet (5 mg total) by mouth daily at 4 PM. 06/23/23  Yes Marcelino Duster, MD      I spent 40 minutes caring for this patient today, including preparing to see the patient, obtaining a medical history , reviewing a separately obtained history, performing a medically appropriate examination and/or  evaluation, counseling and educating the patient/family/caregiver, ordering medications, tests, or procedures, and documenting clinical information in the electronic health record     Raechel Chute, MD [facility] 07/02/2023 5:13 PM

## 2023-07-03 ENCOUNTER — Encounter: Payer: Self-pay | Admitting: Vascular Surgery

## 2023-07-03 DIAGNOSIS — J9602 Acute respiratory failure with hypercapnia: Secondary | ICD-10-CM

## 2023-07-03 DIAGNOSIS — J189 Pneumonia, unspecified organism: Secondary | ICD-10-CM | POA: Diagnosis not present

## 2023-07-03 DIAGNOSIS — J9601 Acute respiratory failure with hypoxia: Secondary | ICD-10-CM | POA: Diagnosis not present

## 2023-07-03 DIAGNOSIS — J441 Chronic obstructive pulmonary disease with (acute) exacerbation: Secondary | ICD-10-CM

## 2023-07-03 DIAGNOSIS — A419 Sepsis, unspecified organism: Secondary | ICD-10-CM

## 2023-07-03 DIAGNOSIS — I825Z1 Chronic embolism and thrombosis of unspecified deep veins of right distal lower extremity: Secondary | ICD-10-CM | POA: Diagnosis not present

## 2023-07-03 DIAGNOSIS — J9621 Acute and chronic respiratory failure with hypoxia: Secondary | ICD-10-CM | POA: Diagnosis not present

## 2023-07-03 DIAGNOSIS — J449 Chronic obstructive pulmonary disease, unspecified: Secondary | ICD-10-CM | POA: Diagnosis not present

## 2023-07-03 DIAGNOSIS — Z86711 Personal history of pulmonary embolism: Secondary | ICD-10-CM | POA: Diagnosis not present

## 2023-07-03 DIAGNOSIS — Z7189 Other specified counseling: Secondary | ICD-10-CM | POA: Diagnosis not present

## 2023-07-03 LAB — BLOOD GAS, ARTERIAL
Acid-Base Excess: 9.7 mmol/L — ABNORMAL HIGH (ref 0.0–2.0)
Bicarbonate: 36.3 mmol/L — ABNORMAL HIGH (ref 20.0–28.0)
Expiratory PAP: 5 cmH2O
FIO2: 0.4 %
Inspiratory PAP: 10 cmH2O
O2 Saturation: 98.3 %
Patient temperature: 37
pCO2 arterial: 56 mmHg — ABNORMAL HIGH (ref 32–48)
pH, Arterial: 7.42 (ref 7.35–7.45)
pO2, Arterial: 95 mmHg (ref 83–108)

## 2023-07-03 MED ORDER — IPRATROPIUM-ALBUTEROL 0.5-2.5 (3) MG/3ML IN SOLN
3.0000 mL | RESPIRATORY_TRACT | Status: DC
  Administered 2023-07-03: 3 mL via RESPIRATORY_TRACT
  Filled 2023-07-03: qty 3

## 2023-07-03 MED ORDER — BUDESONIDE 0.5 MG/2ML IN SUSP
0.5000 mg | Freq: Two times a day (BID) | RESPIRATORY_TRACT | Status: DC
  Administered 2023-07-03: 0.5 mg via RESPIRATORY_TRACT
  Filled 2023-07-03: qty 2

## 2023-07-03 MED ORDER — METHYLPREDNISOLONE SODIUM SUCC 40 MG IJ SOLR
40.0000 mg | Freq: Two times a day (BID) | INTRAMUSCULAR | Status: DC
  Administered 2023-07-03: 40 mg via INTRAVENOUS
  Filled 2023-07-03: qty 1

## 2023-07-03 MED ORDER — MORPHINE 100MG IN NS 100ML (1MG/ML) PREMIX INFUSION
0.0000 mg/h | INTRAVENOUS | Status: DC
  Administered 2023-07-03: 5 mg/h via INTRAVENOUS
  Filled 2023-07-03: qty 100

## 2023-07-03 MED ORDER — HALOPERIDOL LACTATE 5 MG/ML IJ SOLN: 2.5000 mg | INTRAMUSCULAR | Status: DC | PRN

## 2023-07-03 MED ORDER — ONDANSETRON 4 MG PO TBDP: 4.0000 mg | ORAL_TABLET | Freq: Four times a day (QID) | ORAL | Status: DC | PRN

## 2023-07-03 MED ORDER — MORPHINE BOLUS VIA INFUSION: 5.0000 mg | INTRAVENOUS | Status: DC | PRN

## 2023-07-03 MED ORDER — POLYVINYL ALCOHOL 1.4 % OP SOLN: 1.0000 [drp] | Freq: Four times a day (QID) | OPHTHALMIC | Status: DC | PRN

## 2023-07-03 MED ORDER — ACETAMINOPHEN 650 MG RE SUPP: 650.0000 mg | Freq: Four times a day (QID) | RECTAL | Status: DC | PRN

## 2023-07-03 MED ORDER — GLYCOPYRROLATE 1 MG PO TABS: 1.0000 mg | ORAL_TABLET | ORAL | Status: DC | PRN

## 2023-07-03 MED ORDER — ONDANSETRON HCL 4 MG/2ML IJ SOLN: 4.0000 mg | Freq: Four times a day (QID) | INTRAMUSCULAR | Status: DC | PRN

## 2023-07-03 MED ORDER — GLYCOPYRROLATE 0.2 MG/ML IJ SOLN: 0.2000 mg | INTRAMUSCULAR | Status: DC | PRN

## 2023-07-03 MED ORDER — GLYCOPYRROLATE 0.2 MG/ML IJ SOLN
0.2000 mg | INTRAMUSCULAR | Status: DC | PRN
  Administered 2023-07-03 (×3): 0.2 mg via INTRAVENOUS
  Filled 2023-07-03 (×3): qty 1

## 2023-07-03 MED ORDER — ACETAMINOPHEN 325 MG PO TABS: 650.0000 mg | ORAL_TABLET | Freq: Four times a day (QID) | ORAL | Status: DC | PRN

## 2023-07-03 NOTE — IPAL (Signed)
  Interdisciplinary Goals of Care Family Meeting   Date carried out: 07/03/2023  Location of the meeting: Bedside  Member's involved: Physician and Family Member or next of kin      GOALS OF CARE DISCUSSION  The Clinical status was relayed to family in detail- Daughter AT Bedside   Patient with increased WOB and using accessory muscles to breathe Explained to family course of therapy and the modalities   Patient with Progressive RESP failure with a very high probablity of a very minimal chance of meaningful recovery despite all aggressive and optimal medical therapy.    Daughter  understands the situation.  She has  consented and agreed to DNR status We will discuss intubation or DNI in next 24 hrs    Family are satisfied with Plan of action and management. All questions answered  Additional CC time 32 mins    Jorge Rosales, M.D.  [facility]  Medical Director [facility] Medical Director [facility] Cardio-Pulmonary Department

## 2023-07-03 NOTE — Progress Notes (Signed)
   07/03/23 1100  Spiritual Encounters  Type of Visit Initial  Care provided to: Patient  Conversation partners present during encounter Nurse  Referral source Nurse (RN/NT/LPN)  Reason for visit Urgent spiritual support  OnCall Visit Yes   Chaplain received spiritual consult for the patient. Nurse told chaplain that the family is not ready yet to put the patient on comfort care. Nurse said she will page me when the family is ready for that. Chaplain services is available for spiritual and emotional support as needed.

## 2023-07-03 NOTE — Plan of Care (Signed)
  Problem: Fluid Volume: Goal: Hemodynamic stability will improve Outcome: Progressing   Problem: Respiratory: Goal: Ability to maintain adequate ventilation will improve Outcome: Progressing   Problem: Clinical Measurements: Goal: Ability to maintain a body temperature in the normal range will improve Outcome: Progressing   Problem: Respiratory: Goal: Ability to maintain adequate ventilation will improve Outcome: Progressing Goal: Ability to maintain a clear airway will improve Outcome: Progressing   Problem: Coping: Goal: Level of anxiety will decrease Outcome: Progressing   Problem: Pain Managment: Goal: General experience of comfort will improve Outcome: Progressing   Problem: Safety: Goal: Ability to remain free from injury will improve Outcome: Progressing

## 2023-07-03 NOTE — Progress Notes (Signed)
Daily Progress Note   Patient Name: Jorge Rosales       Date: 07/03/2023 DOB: 1946/01/23  Age: 77 y.o. MRN#: [id number] Attending Physician: Delfino Lovett, MD Primary Care Physician: [facility] Admit Date: 06/11/2023  Reason for Consultation/Follow-up: Establishing goals of care  Subjective: Notes and labs reviewed.  In to see patient.  He is currently resting in bed with his daughter at bedside.  Daughter states he has been restless, and discusses the need for Precedex.  He awakens briefly to say "nice to meet you" and drifted back to sleep.  Patient's ex-wife arrived to visit with him and daughter and I stepped out to talk further.    She states they have decided to shift to comfort focused care. She states patient's grandchildren and patient's sister will be arriving this afternoon to spend time with him.   We discussed his status, and she has been updated by primary and specialist teams.   She states that she still has reservations about the decision, and wants to make sure that she is doing the right thing.  She states he told her this morning he was ready to go, and was ready to be with family who is already in heaven.  She discusses questions that other family members have asked regarding his care. We discussed continued aggressive care versus comfort care.  With conversation, daughter wants to continue with plan for comfort care after family has arrived.  She would want him placed back on BiPAP intermittently if needed until this time.  We discussed that he may possibly have a hospital death depending on his status without BiPAP.  Patient was able to awaken to speak at end of visit; his daughter was present at bedside.  Discussed plan of family coming to visit with him, and a plan of  comfort care and allowing him to die naturally.  When asked if he is in agreement with this plan he states "yeah" and softly nodded his head when asked if he was okay with this plan.    Updated nursing.  Orders in place for symptom management by primary team.  Length of Stay: 5  Current Medications: Scheduled Meds:   budesonide (PULMICORT) nebulizer solution  0.5 mg Nebulization BID   Chlorhexidine Gluconate Cloth  6 each Topical Daily   docusate  sodium  100 mg Oral BID   DULoxetine  30 mg Oral Daily   ipratropium-albuterol  3 mL Nebulization Q4H   mouth rinse  15 mL Mouth Rinse 4 times per day   pantoprazole (PROTONIX) IV  40 mg Intravenous Q24H   sodium chloride flush  3 mL Intravenous Q12H    Continuous Infusions:  dexmedetomidine (PRECEDEX) IV infusion 1 mcg/kg/hr (07/03/23 0700)   heparin 1,850 Units/hr (07/03/23 0700)   morphine      PRN Meds: acetaminophen **OR** acetaminophen, albuterol, bisacodyl, fentaNYL (SUBLIMAZE) injection, glycopyrrolate **OR** glycopyrrolate **OR** glycopyrrolate, guaiFENesin, haloperidol lactate, LORazepam, morphine injection, morphine, ondansetron **OR** ondansetron (ZOFRAN) IV, mouth rinse, mouth rinse, polyethylene glycol, polyvinyl alcohol  Physical Exam Constitutional:      Comments: Intermittently awake  Pulmonary:     Comments: Coarse lung sounds noted. Skin:    Comments: Upper extremities warm to the touch.  Feet are cool to the touch.             Vital Signs: BP 120/80   Pulse 85   Temp (!) 96.5 F (35.8 C) (Axillary)   Resp 20   Ht 5\' 10"  (1.778 m)   Wt 56.9 kg   SpO2 100%   BMI 18.00 kg/m  SpO2: SpO2: 100 % O2 Device: O2 Device: High Flow Nasal Cannula O2 Flow Rate: O2 Flow Rate (L/min): 10 L/min  Intake/output summary:  Intake/Output Summary (Last 24 hours) at 07/03/2023 1129 Last data filed at 07/03/2023 1041 Gross per 24 hour  Intake 1716.18 ml  Output 1050 ml  Net 666.18 ml   LBM: Last BM Date :  (prior to  admit) Baseline Weight: Weight: 58 kg Most recent weight: Weight: 56.9 kg   Patient Active Problem List   Diagnosis Date Noted   Abscess of upper lobe of left lung with pneumonia (HCC) 07/01/2023   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) 06/29/2023   Acute respiratory failure with hypoxia (HCC) 05/29/2023   Respiratory failure with hypoxia (HCC) 06/27/2023   Protein-calorie malnutrition, severe 06/22/2023   Debility 06/21/2023   Hypokalemia 06/21/2023   Supratherapeutic INR 06/18/2023   Hyponatremia 06/18/2023   History of seizures 06/18/2023   History of pulmonary embolism 06/17/2023   Warfarin anticoagulation 06/17/2023   Left upper lobe pneumonia 06/17/2023   Sepsis (HCC) 06/17/2023   CAP (community acquired pneumonia) 06/17/2023   Bradycardia by electrocardiogram 01/23/2023   Pulmonary hypertension, unspecified (HCC) 01/23/2023   Nonspecific abnormal electrocardiogram (ECG) (EKG) 01/23/2023   Acute pulmonary embolism (HCC) 05/16/2021   Pulmonary embolism (HCC) 05/16/2021   Pulmonary cavitary lesion 04/11/2014   Smoker 01/23/2014   Solitary pulmonary nodule 01/23/2014   COPD GOLD III 01/22/2014   Seizure disorder (HCC) 06/08/2009    Palliative Care Assessment & Plan     Recommendations/Plan: -Daughter would like to switch to comfort care when all family members have arrived to bedside this afternoon.  Patient is amenable to this plan.  -Medications for symptom management have been placed by primary team.  Code Status:    Code Status Orders  (From admission, onward)           Start     Ordered   07/03/23 0844  Do not attempt resuscitation (DNR)  Continuous       Question Answer Comment  If patient has no pulse and is not breathing Do Not Attempt Resuscitation   If patient has a pulse and/or is breathing: Medical Treatment Goals COMFORT MEASURES:  Keep clean/warm/dry, use medication by any route; positioning, wound care  and other measures to relieve pain/suffering; use oxygen, suction/manual treatment of airway obstruction for comfort; do not transfer unless for comfort needs.   Consent: Discussion documented in EHR or advanced directives reviewed      07/03/23 0844           Code Status History     Date Active Date Inactive Code Status Order ID Comments User Context   07/03/2023 0800 07/03/2023 0844 DNR [id number]  Erin Fulling, MD Inpatient   05/31/2023 2335 07/03/2023 0800 Full Code [id number]  Gertha Calkin, MD Inpatient   06/03/2023 2233 06/17/2023 2334 Full Code [id number]  Gertha Calkin, MD ED   06/17/2023 2246 06/23/2023 2024 Full Code [id number]  Andris Baumann, MD ED   05/16/2021 0600 05/19/2021 1826 Full Code [id number]  Staci Acosta, MD Inpatient      Advance Directive Documentation    Flowsheet Row Most Recent Value  Type of Advance Directive Living will, Healthcare Power of Attorney  Pre-existing out of facility DNR order (yellow form or pink MOST form) --  "MOST" Form in Place? --       Prognosis:  Hours - Days   Care plan was discussed with nursing  Thank you for allowing the Palliative Medicine Team to assist in the care of this patient.   Morton Stall, NP  Please contact Palliative Medicine Team phone at [phone] for questions and concerns.

## 2023-07-03 NOTE — Progress Notes (Signed)
[place] at [facility]   PATIENT NAME: Jorge Rosales    MR#:  [id number]  PCP: [facility]  DATE OF BIRTH:  [birth year]  SUBJECTIVE:  CHIEF COMPLAINT:   Chief Complaint  Patient presents with   Shortness of Breath  Patient is actively dying, off BiPAP and now made DNR.  Daughter getting ready to transition to comfort care once rest of the family arrives REVIEW OF SYSTEMS:  Review of Systems  Reason unable to perform ROS: Actively dying.   DRUG ALLERGIES:   Allergies  Allergen Reactions   Wound Dressing Adhesive Rash    Rash with blisters; band-aids espescially. Per [facility] Clinical Summary   Latex Rash    Per [facility] Clinical Summary   VITALS:  Blood pressure (!) 152/78, pulse 84, temperature 97.6 F (36.4 C), temperature source Axillary, resp. rate 17, height 5\' 10"  (1.778 m), weight 56.9 kg, SpO2 100%. PHYSICAL EXAMINATION:  Physical Exam Vitals and nursing note reviewed.  Constitutional:      General: He is in acute distress.     Appearance: He is ill-appearing and toxic-appearing.  HENT:     Head: Normocephalic and atraumatic.  Eyes:     Extraocular Movements: Extraocular movements intact.     Pupils: Pupils are equal, round, and reactive to light.  Cardiovascular:     Rate and Rhythm: Regular rhythm. Tachycardia present.  Pulmonary:     Effort: Tachypnea, accessory muscle usage and respiratory distress present.     Breath sounds: Examination of the right-lower field reveals decreased breath sounds and rhonchi. Examination of the left-lower field reveals decreased breath sounds and rhonchi. Decreased breath sounds and rhonchi present.     Comments: Cheyne-Stokes respiration Abdominal:     General: Bowel sounds are normal.     Palpations: Abdomen is soft.  Musculoskeletal:        General: Normal range of motion.     Cervical back: Normal range of motion and neck supple.  Skin:    General: Skin is warm and dry.     Coloration: Skin is  cyanotic.     Comments: He has mottling on the right side of the body also in the right great toe in the lower foot  Neurological:     General: No focal deficit present.     Mental Status: He is disoriented.  Psychiatric:     Comments: Difficult evaluate as he is actively dying    LABORATORY PANEL:  Male CBC Recent Labs  Lab 07/03/23 0333  WBC 19.9*  HGB 8.9*  HCT 27.8*  PLT 437*   ------------------------------------------------------------------------------------------------------------------ Chemistries  Recent Labs  Lab 06/01/2023 1806 06/29/23 0837 07/02/2023 0448 07/01/23 0714 07/03/23 0333  NA 130*   < > 138   < > 141  K 3.9   < > 3.9   < > 3.2*  CL 96*   < > 102   < > 101  CO2 28   < > 28   < > 29  GLUCOSE 187*   < > 98   < > 85  BUN 16   < > 12   < > 10  CREATININE 0.54*   < > 0.52*   < > 0.48*  CALCIUM 7.7*   < > 7.9*   < > 7.8*  MG  --   --  2.4  --   --   AST 20  --   --   --   --  ALT 17  --   --   --   --   ALKPHOS 125  --   --   --   --   BILITOT 0.4  --   --   --   --    < > = values in this interval not displayed.   MEDICATIONS:  Scheduled Meds:  budesonide (PULMICORT) nebulizer solution  0.5 mg Nebulization BID   Chlorhexidine Gluconate Cloth  6 each Topical Daily   docusate sodium  100 mg Oral BID   DULoxetine  30 mg Oral Daily   mouth rinse  15 mL Mouth Rinse 4 times per day   pantoprazole (PROTONIX) IV  40 mg Intravenous Q24H   sodium chloride flush  3 mL Intravenous Q12H   Continuous Infusions:  dexmedetomidine (PRECEDEX) IV infusion 0.8 mcg/kg/hr (07/03/23 1200)   morphine     RADIOLOGY:  No results found. ASSESSMENT AND PLAN:  77 y.o. male with medical history significant for COPD on home O2 at 2 L, former smoker, pulmonary hypertension, PE in 2022 on warfarin anticoagulation admitted for respi failure  Acute on chronic hypoxic respiratory failure Acute submassive PE Pneumonia with possible lung abscess COPD on chronic  oxygen Former tobacco use Off BiPAP  He is actively dying  Hematuria - Coude Cathetar placed for urinary retention. - likely due to foley related trauma.  Heparin stopped  Acute submassive PE Subacute DVT in bilateral lower extremity Status post bilateral pulmonary thrombectomy and IVC filter insertion by vascular surgery Echo shows normal LV systolic function with a EF of 60 to 65%.    Unintentional weight loss 40 pounds in last 6 months Pending AFB sputum (2 of 3 negative) Sputum growing Yeast   History of seizure  Goals of care  Currently DNR.  Daughter is agreeable to transition to full comfort care once the rest of the family arrives.  Patient is actively dying.   Body mass index is 18 kg/m.  Net IO Since Admission: 2,659.37 mL [07/03/23 1432]      LOS: 5 days   Consultants: ID Vascular surgery Intensivist Palliative care    Antibiotics: IV Zosyn  Status is: Inpatient 3-4   DVT prophylaxis:   Heparin drip     He is actively dying    Family Communication: Updated daughter and son-in-law at bedside   All the records are reviewed and case discussed with Nursing and TOC team. Management plans discussed with the patient, family and they are in agreement.  CODE STATUS: DNR Level of care: Stepdown  TOTAL TIME TAKING CARE OF THIS PATIENT: 35 minutes.   More than 50% of the time was spent in counseling/coordination of care: YES  POSSIBLE D/C IN  DAYS, DEPENDING ON CLINICAL CONDITION.   Delfino Lovett M.D on 07/03/2023 at 2:32 PM  [facility]   CC: Primary care physician; [facility]  Note: This dictation was prepared with Dragon dictation along with smaller phrase technology. Any transcriptional errors that result from this process are unintentional.

## 2023-07-03 NOTE — Progress Notes (Signed)
Pt is comfort care  Will sign off

## 2023-07-03 NOTE — Progress Notes (Signed)
NAME:  Jorge Rosales, MRN:  [id number], DOB:  24-Feb-1946, LOS: 5 ADMISSION DATE:  06/26/2023, CHIEF COMPLAINT:  Acute Hypoxic Respiratory Failure   History of Present Illness:   77 yo M presenting to [facility] ED from [facility] via EMS for evaluation of dyspnea.   History provided per chart review and bedside patient report. Patient was recently admitted 06/17/23-06/23/23 for treatment of a LUL pneumonia and was discharged to [facility] for rehab. Patient admits to continued symptoms post discharge including progressively worsening dyspnea and a persistent productive cough with brown sputum. He denies chest pain, palpitations, fever/chills, nausea/vomiting/ abdominal pain/ diarrhea, signs of bleeding, seizures or LOC. He does report new BLE edema over the past 2 days. He recently had a supra therapeutic INR of 12, but the patient reported that his Coumadin was never stopped completely just reduced. He has been on 3-4 L [place] since discharging to rehab. EMS reported initial SpO2 at 79% on 4 L Oval they administered 2 g Mg, 1 duo neb and 125 mg of solumedrol.   ED course: Upon arrival patient alert and oriented, with tachypnea and hypertension. He was transitioned to BIPAP support. Labs significant for hypoalbuminemia, leukocytosis without lactic acidosis and a mildly elevated BNP. Imaging concerning for lung abscess continued pneumonia and positive of acute submassive PE.  CXR 06/16/2023: Persistent dense consolidation within the left upper lobe, compatible with pneumonia. The patchy areas of airspace disease in the left perihilar region seen on prior chest x-ray demonstrate mild improvement. Stable emphysema CT angio chest PE 06/15/2023: Acute pulmonary emboli in the distal right main pulmonary artery extending into segmental and subsegmental branches of the right upper lobe. Additional segmental and subsegmental pulmonary emboli in the right lower lobe and right middle lobe. Positive for acute PE with CT evidence of right  heart strain (RV/LV Ratio = 1.3) consistent with at least submassive (intermediate risk) PE. The presence of right heart strain has been associated with an increased risk of morbidity and mortality. New air-fluid level in the left apex worrisome for abscess. Left upper lobe infiltrate otherwise appears unchanged. Lingular airspace disease has decreased, but there is some persistent ground-glass opacity in this region. Spiculated density in the left lower lobe is unchanged. Stable ground-glass opacity in the right upper lobe.  Echocardiogram 06/29/2023  1. Left ventricular ejection fraction, by estimation, is 60 to 65%. The  left ventricle has normal function. The left ventricle has no regional  wall motion abnormalities. Left ventricular diastolic parameters are  indeterminate.   2. Right ventricular systolic function is normal. The right ventricular  size is normal. There is mildly elevated pulmonary artery systolic  pressure. The estimated right ventricular systolic pressure is 41.0 mmHg.   3. The mitral valve is normal in structure. No evidence of mitral valve  regurgitation. No evidence of mitral stenosis.   4. Tricuspid valve regurgitation is mild to moderate.   5. The aortic valve has an indeterminant number of cusps. Aortic valve  regurgitation is not visualized. No aortic stenosis is present.   6. The inferior vena cava is normal in size with greater than 50%  respiratory variability, suggesting right atrial pressure of 3 mmHg.   Pertinent  Medical History  Seizure disorder- drop attacks COPD (previous 2 L [place] PRN > recently 3-4 L [place] continuously) Cataracts Former Tobacco [place] (quit 2015) Bilateral multiple subsegmental PE on warfarin (2022) Pulmonary HTN Depression  Interim History / Subjective:  Severe SOB Severe resp failure On BiPAP Increased WOB High risk  for intubation High risk for cardiac arrest  Objective   Blood pressure 124/68, pulse 83, temperature 97.8 F (36.6  C), temperature source Axillary, resp. rate (!) 21, height 5\' 10"  (1.778 m), weight 56.9 kg, SpO2 98%.    FiO2 (%):  [40 %-45 %] 40 %   Intake/Output Summary (Last 24 hours) at 07/03/2023 0742 Last data filed at 07/03/2023 0700 Gross per 24 hour  Intake 1713.18 ml  Output 1050 ml  Net 663.18 ml   Filed Weights   06/01/2023 1803 06/05/2023 2330 07/02/23 0600  Weight: 58 kg 55.9 kg 56.9 kg     REVIEW OF SYSTEMS  PATIENT IS UNABLE TO PROVIDE COMPLETE REVIEW OF SYSTEMS DUE TO SEVERE CRITICAL ILLNESS   PHYSICAL EXAMINATION:  GENERAL:critically ill appearing, +resp distress EYES: Pupils equal, round, reactive to light.  No scleral icterus.  MOUTH: Moist mucosal membrane. biPAP NECK: Supple.  PULMONARY: Lungs clear to auscultation, +rhonchi, +wheezing CARDIOVASCULAR: S1 and S2.  Regular rate and rhythm GASTROINTESTINAL: Soft, nontender, -distended. Positive bowel sounds.  MUSCULOSKELETAL: No swelling, clubbing, or edema.  NEUROLOGIC: obtunded,sedated SKIN:normal, COLD to touch, Capillary refill delayed  Pulses present bilaterally   Labs   CBC: Recent Labs  Lab 06/12/2023 1806 07/19/2023 0448 07/01/23 0714 07/02/23 0627 07/03/23 0333  WBC 18.6* 23.5* 24.1* 23.6* 19.9*  NEUTROABS 14.8*  --   --   --   --   HGB 12.0* 11.6* 10.6* 10.5* 8.9*  HCT 37.3* 35.9* 33.2* 33.2* 27.8*  MCV 94.7 94.7 97.6 97.4 97.2  PLT 506* 624* 581* 506* 437*    Basic Metabolic Panel: Recent Labs  Lab 07/03/2023 0448 07/01/23 0714 07/01/23 0716 07/02/23 0627 07/03/23 0333  NA 138 139 138 141  140 141  K 3.9 4.0 4.0 3.7  3.6 3.2*  CL 102 103 102 102  101 101  CO2 28 28 28 28  30 29   GLUCOSE 98 111* 113* 93  87 85  BUN 12 13 12 11  10 10   CREATININE 0.52* 0.52* 0.49* 0.55*  0.36* 0.48*  CALCIUM 7.9* 8.1* 8.0* 8.0*  8.1* 7.8*  MG 2.4  --   --   --   --   PHOS 3.2  --  3.1 2.3* 2.8   GFR: Estimated Creatinine Clearance: 63.2 mL/min (A) (by C-G formula based on SCr of 0.48 mg/dL  (L)). Recent Labs  Lab 06/25/2023 1806 06/07/2023 2231 07/03/2023 0448 07/01/23 0714 07/02/23 0627 07/03/23 0333  PROCALCITON 0.15  --  <0.10 0.24  --   --   WBC 18.6*  --  23.5* 24.1* 23.6* 19.9*  LATICACIDVEN 1.6 1.4  --   --   --   --     Liver Function Tests: Recent Labs  Lab 06/19/2023 1806 07/29/2023 0448 07/01/23 0716 07/02/23 0627 07/03/23 0333  AST 20  --   --   --   --   ALT 17  --   --   --   --   ALKPHOS 125  --   --   --   --   BILITOT 0.4  --   --   --   --   PROT 7.5  --   --   --   --   ALBUMIN 2.3* 2.3* 2.3* 2.2* 2.1*   No results for input(s): "LIPASE", "AMYLASE" in the last 168 hours. No results for input(s): "AMMONIA" in the last 168 hours.  ABG    Component Value Date/Time   PHART 7.37 07/12/2023 1825  PCO2ART 49 (H) 07/08/2023 1825   PO2ART 91 07/12/2023 1825   HCO3 28.3 (H) 07/21/2023 1825   O2SAT 97.6 07/14/2023 1825     Coagulation Profile: Recent Labs  Lab 06/24/2023 1806  INR 1.9*    Cardiac Enzymes: No results for input(s): "CKTOTAL", "CKMB", "CKMBINDEX", "TROPONINI" in the last 168 hours.  HbA1C: No results found for: "HGBA1C"  CBG: Recent Labs  Lab 06/25/2023 2326  GLUCAP 143*     Assessment & Plan:  77 yo white male with severe hypoxic and hypercapnic  resp failure with COPD exacerbation with ACUTE PE WITH LUL PNEUMONIA, necrotizing pneumonia  Severe ACUTE Hypoxic and Hypercapnic Respiratory Failure -Wean Fio2  - Head of bed elevated 30 degrees - Intermittent chest x-ray & ABG PRN - Ensure adequate pulmonary hygiene  Plan for intubation in next 24 hrs if no improvement  SEVERE COPD EXACERBATION -START IV steroids as prescribed -continue NEB THERAPY as prescribed -morphine as needed -wean fio2 as needed and tolerated    INFECTIOUS DISEASE -continue antibiotics as prescribed -follow up cultures LUL pneumonia / lung abscess   Pulmonary Embolism s/p thrombectomy Heparin on hold due to hematuria  NEURO -consider  low dose dexmedetomidine gtt (0.1-0.2) for anxiety and air hunger   ENDO - ICU hypoglycemic\Hyperglycemia protocol -check FSBS per protocol   GI GI PROPHYLAXIS as indicated  NUTRITIONAL STATUS DIET-->NPO Constipation protocol as indicated   ELECTROLYTES -follow labs as needed -replace as needed -pharmacy consultation and following   SEVERE MALNUTRITION POA FTT     Best practice     Diet: NPO DVT prophylaxis: on hold Central venous access:  N/A Arterial line:  N/A Foley:  Yes, and it is still needed Mobility:  bed rest  Disposition:ICU    DVT/GI PRX ordered and assessed TRANSFUSIONS AS NEEDED MONITOR FSBS I Assessed the need for Labs I Assessed the need for Foley I Assessed the need for Central Venous Line Family Discussion when available I Assessed the need for Mobilization I made an Assessment of medications to be adjusted accordingly Safety Risk assessment completed  CASE DISCUSSED IN MULTIDISCIPLINARY ROUNDS WITH ICU TEAM    Critical Care Time devoted to patient care services described in this note is 55 minutes.  Critical care was necessary to treat /prevent imminent and life-threatening deterioration. Overall, patient is critically ill, prognosis is guarded.  Patient with Multiorgan failure and at high risk for cardiac arrest and death.    Lucie Leather, M.D.  [facility]  Medical Director [facility] Medical Director [facility] Cardio-Pulmonary Department

## 2023-07-03 NOTE — Progress Notes (Signed)
Patient's daughter asking if airborne precautions for rule-out TB can be removed. 2/3 Acid-fast smears have resulted negative at this time. Currently awaiting on the third smear to result. Messaged Infection Prevention about patient's daughter's request. Infection Prevention states per [facility], pt must remain on isolation until all 3/3 smears have resulted. Daughter made aware and understanding.

## 2023-07-04 DIAGNOSIS — Z7189 Other specified counseling: Secondary | ICD-10-CM | POA: Diagnosis not present

## 2023-07-30 NOTE — Death Summary Note (Signed)
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 male with medical history significant for COPD on home O2 at 2 L, former smoker, pulmonary hypertension, PE in 07-21-2021 on warfarin anticoagulation admitted for respi failure   Acute on chronic hypoxic respiratory failure Acute submassive PE Pneumonia with possible lung abscess COPD on chronic oxygen Former tobacco use Hematuria Acute submassive PE Subacute DVT in bilateral lower extremity Status post bilateral pulmonary thrombectomy and IVC filter insertion by vascular surgery Echo shows normal LV systolic function with a EF of 60 to 65%.     Unintentional weight loss 40 pounds in last 6 months   History of seizure  He was made comfort care and passed away peacefully.  Family at bedside and grieving appropriately      Procedures: Bilateral pulmonary thrombectomy and IVC filter placement  Consultations: Intensivist, vascular surgery, ID, palliative care  The results of significant diagnostics from this hospitalization (including imaging, microbiology, ancillary and laboratory) are listed below for reference.   Significant Diagnostic Studies: DG Chest Port 1 View  Result Date: 07/09/2023 CLINICAL DATA:  Shortness  of breath EXAM: PORTABLE CHEST 1 VIEW COMPARISON:  07-10-2023 FINDINGS: There is hyperinflation of the lungs compatible with COPD. Heart is normal size. Mediastinal contours are within normal limits. Dense airspace disease again noted in the left upper lobe, unchanged since recent chest x-ray and chest CT. Linear scarring in the right upper lobe. No new acute infiltrates. No visible effusions or pneumothorax. No acute bony abnormality. IMPRESSION: Stable dense consolidation in the left upper lobe. Underlying emphysema. Electronically Signed   By: Charlett Nose M.D.   On: 07/01/2023 18:11   PERIPHERAL VASCULAR CATHETERIZATION  Result Date: 07/22/2023 See surgical note for result.  CT Angio Chest Pulmonary Embolism (PE) W or WO Contrast  Addendum Date: 06/29/2023   ADDENDUM REPORT: 06/29/2023 19:27 ADDENDUM: These results were called by telephone at the time of interpretation on 2023/07/10 at 10:32 p.m. to provider Dr. Roxan Hockey, who verbally acknowledged these results. Electronically Signed   By: Darliss Cheney M.D.   On: 06/29/2023 19:27   Result Date: 06/29/2023 CLINICAL DATA:  High probability for PE.  Shortness of breath. EXAM: CT ANGIOGRAPHY CHEST WITH CONTRAST TECHNIQUE: Multidetector CT imaging of the chest was performed using the standard protocol during bolus administration of intravenous contrast. Multiplanar CT image reconstructions and MIPs were obtained to evaluate the vascular anatomy. RADIATION DOSE REDUCTION: This exam was performed according to the departmental dose-optimization program which includes automated exposure control, adjustment of the mA and/or kV according to patient size and/or use of iterative reconstruction technique. CONTRAST:  75mL OMNIPAQUE IOHEXOL 350 MG/ML SOLN COMPARISON:  CT angiogram chest 06/20/2023 FINDINGS: Cardiovascular: There is adequate opacification of the pulmonary  arteries. Pulmonary emboli are seen within the distal right main pulmonary artery extending into  segmental and subsegmental branches of the right upper lobe. There also segmental right lower lobe and right middle lobe pulmonary emboli. Heart is normal in size. No evidence for aortic aneurysm. There are atherosclerotic calcifications of the aorta. There is no pericardial effusion. Mediastinum/Nodes: No enlarged mediastinal, hilar, or axillary lymph nodes. Thyroid gland, trachea, and esophagus demonstrate no significant findings. Lungs/Pleura: Large left upper lobe infiltrate with air bronchograms again seen. There is a new air-fluid level in the anterior apex measuring 3.5 x 4.5 by 2.0 cm. Focal spiculated density in the posterior left lower lobe appears unchanged. Lingular airspace disease has decreased, but there is some persistent ground-glass opacity in this region. Linear opacity with ground-glass in the right upper lobe appears unchanged. Emphysematous changes are again noted. No pleural effusion or pneumothorax. Upper Abdomen: No acute findings. There is a right renal cyst measuring 2.1 cm. Musculoskeletal: Multilevel degenerative changes affect the spine. Review of the MIP images confirms the above findings. IMPRESSION: 1. Acute pulmonary emboli in the distal right main pulmonary artery extending into segmental and subsegmental branches of the right upper lobe. Additional segmental and subsegmental pulmonary emboli in the right lower lobe and right middle lobe. Positive for acute PE with CT evidence of right heart strain (RV/LV Ratio = 1.3) consistent with at least submassive (intermediate risk) PE. The presence of right heart strain has been associated with an increased risk of morbidity and mortality. 2. New air-fluid level in the left apex worrisome for abscess. 3. Left upper lobe infiltrate otherwise appears unchanged. 4. Lingular airspace disease has decreased, but there is some persistent ground-glass opacity in this region. 5. Spiculated density in the left lower lobe is unchanged. 6. Stable  ground-glass opacity in the right upper lobe. Electronically Signed: By: Darliss Cheney M.D. On: 06/15/2023 22:28   ECHOCARDIOGRAM COMPLETE  Result Date: 06/29/2023    ECHOCARDIOGRAM REPORT   Patient Name:   Sy Ketelsen Date of Exam: 06/29/2023 Medical Rec #:  [id number]    Height:       70.0 in Accession #:    [id number]   Weight:       123.2 lb Date of Birth:  1946-07-12     BSA:          1.699 m Patient Age:    77 years     BP:           123/79 mmHg Patient Gender: M            HR:           100 bpm. Exam Location:  [facility] Procedure: 2D Echo, Cardiac Doppler and Color Doppler Indications:     Pulmonary embolus I26.09  History:         Patient has prior history of Echocardiogram examinations, most                  recent 05/16/2023. COPD. History of pulmonary embolism, former                  heavy smoker.  Sonographer:     Cristela Blue Referring Phys:  [id number] BRITTON L R[place] Diagnosing Phys: Julien Nordmann MD  Sonographer Comments: Technically challenging study due to limited acoustic windows and no apical window. Image acquisition challenging due to COPD. IMPRESSIONS  1. Left ventricular ejection fraction, by estimation, is 60 to 65%. The left ventricle has normal function. The left ventricle  has no regional wall motion abnormalities. Left ventricular diastolic parameters are indeterminate.  2. Right ventricular systolic function is normal. The right ventricular size is normal. There is mildly elevated pulmonary artery systolic pressure. The estimated right ventricular systolic pressure is 41.0 mmHg.  3. The mitral valve is normal in structure. No evidence of mitral valve regurgitation. No evidence of mitral stenosis.  4. Tricuspid valve regurgitation is mild to moderate.  5. The aortic valve has an indeterminant number of cusps. Aortic valve regurgitation is not visualized. No aortic stenosis is present.  6. The inferior vena cava is normal in size with greater than 50% respiratory variability, suggesting  right atrial pressure of 3 mmHg. FINDINGS  Left Ventricle: Left ventricular ejection fraction, by estimation, is 60 to 65%. The left ventricle has normal function. The left ventricle has no regional wall motion abnormalities. The left ventricular internal cavity size was normal in size. There is  no left ventricular hypertrophy. Left ventricular diastolic parameters are indeterminate. Right Ventricle: The right ventricular size is normal. No increase in right ventricular wall thickness. Right ventricular systolic function is normal. There is mildly elevated pulmonary artery systolic pressure. The tricuspid regurgitant velocity is 3.00  m/s, and with an assumed right atrial pressure of 5 mmHg, the estimated right ventricular systolic pressure is 41.0 mmHg. Left Atrium: Left atrial size was normal in size. Right Atrium: Right atrial size was normal in size. Pericardium: There is no evidence of pericardial effusion. Mitral Valve: The mitral valve is normal in structure. No evidence of mitral valve regurgitation. No evidence of mitral valve stenosis. Tricuspid Valve: The tricuspid valve is normal in structure. Tricuspid valve regurgitation is mild to moderate. No evidence of tricuspid stenosis. Aortic Valve: The aortic valve has an indeterminant number of cusps. Aortic valve regurgitation is not visualized. No aortic stenosis is present. Pulmonic Valve: The pulmonic valve was normal in structure. Pulmonic valve regurgitation is not visualized. No evidence of pulmonic stenosis. Aorta: The aortic root is normal in size and structure. Venous: The inferior vena cava is normal in size with greater than 50% respiratory variability, suggesting right atrial pressure of 3 mmHg. IAS/Shunts: No atrial level shunt detected by color flow Doppler.  LEFT VENTRICLE PLAX 2D LVIDd:         4.30 cm LVIDs:         3.00 cm LV PW:         0.90 cm LV IVS:        0.80 cm LVOT diam:     2.10 cm LVOT Area:     3.46 cm  RIGHT VENTRICLE RV Basal  diam:  4.10 cm RV Mid diam:    3.60 cm LEFT ATRIUM         Index LA diam:    1.90 cm 1.12 cm/m   AORTA Ao Root diam: 3.30 cm TRICUSPID VALVE TR Peak grad:   36.0 mmHg TR Vmax:        300.00 cm/s  SHUNTS Systemic Diam: 2.10 cm Julien Nordmann MD Electronically signed by Julien Nordmann MD Signature Date/Time: 06/29/2023/12:01:03 PM    Final    US Venous Img Lower Bilateral (DVT)  Result Date: 06/29/2023 CLINICAL DATA:  77 year old male with history of pulmonary embolism. EXAM: BILATERAL LOWER EXTREMITY VENOUS DOPPLER ULTRASOUND TECHNIQUE: Gray-scale sonography with graded compression, as well as color Doppler and duplex ultrasound were performed to evaluate the lower extremity deep venous systems from the level of the common femoral vein and including the common femoral,  femoral, profunda femoral, popliteal and calf veins including the posterior tibial, peroneal and gastrocnemius veins when visible. The superficial great saphenous vein was also interrogated. Spectral Doppler was utilized to evaluate flow at rest and with distal augmentation maneuvers in the common femoral, femoral and popliteal veins. COMPARISON:  None Available. FINDINGS: RIGHT LOWER EXTREMITY Common Femoral Vein: No evidence of thrombus. Normal compressibility, respiratory phasicity and response to augmentation. Saphenofemoral Junction: No evidence of thrombus. Normal compressibility and flow on color Doppler imaging. Profunda Femoral Vein: No evidence of thrombus. Normal compressibility and flow on color Doppler imaging. Femoral Vein: No evidence of thrombus. Normal compressibility, respiratory phasicity and response to augmentation. Popliteal Vein: Nonocclusive, mildly expansile, thrombus is present. The thrombus is mostly hypoechoic with a few areas of heterogeneously hyperechoic linear morphology. Calf Veins: Occlusive, hypoechoic, minimally expansile thrombus throughout. Other Findings: Mostly simple appearing hypoechoic structure in the  popliteal fossa measuring proximally 2.1 x 1.3 x 1.1 cm. LEFT LOWER EXTREMITY Common Femoral Vein: No evidence of thrombus. Normal compressibility, respiratory phasicity and response to augmentation. Saphenofemoral Junction: No evidence of thrombus. Normal compressibility and flow on color Doppler imaging. Profunda Femoral Vein: No evidence of thrombus. Normal compressibility and flow on color Doppler imaging. Femoral Vein: There is mildly expansile, heterogeneously isoechoic, occlusive thrombus in the central segment of the femoral vein. The peripheral femoral vein is patent. Popliteal Vein: Patent centrally with occlusive, mildly expansile, heterogeneously isoechoic thrombus through the peripheral popliteal extending into the calf veins. Calf Veins: Occlusive, heterogeneously isoechoic thrombus throughout. Other Findings:  None. IMPRESSION: 1. Subacute appearing right lower extremity deep vein thrombosis limited to the popliteal and calf veins. 2. Subacute appearing left lower extremity deep vein thrombosis in the central left femoral vein in addition to the peripheral popliteal vein extending into the calf veins. 3. No evidence of iliac vein extension. 4. Right simple appearing Baker cyst measuring up to 2.1 cm. These results will be called to the ordering clinician or representative by the Radiologist Assistant, and communication documented in the PACS or [employer]. Marliss Coots, MD [facility] Electronically Signed   By: Marliss Coots M.D.   On: 06/29/2023 08:08   DG Chest 1 View  Result Date: 06/21/2023 CLINICAL DATA:  History of pneumonia, sepsis EXAM: CHEST  1 VIEW COMPARISON:  06/20/2023 FINDINGS: 2 frontal views of the chest demonstrate a stable cardiac silhouette. There is persistent dense consolidation within the left upper lobe, with superior retraction of the left hilum consistent with volume loss. Airspace disease in the left  perihilar region has slightly improved. Right chest is clear. Stable hyperinflation and emphysema. No effusion or pneumothorax. No acute bony abnormalities. IMPRESSION: 1. Persistent dense consolidation within the left upper lobe, compatible with pneumonia. The patchy areas of airspace disease in the left perihilar region seen on prior chest x-ray demonstrate mild improvement. 2. Stable emphysema. Electronically Signed   By: Sharlet Salina M.D.   On: 06/27/2023 19:28   CT Angio Chest Pulmonary Embolism (PE) W or WO Contrast  Result Date: 06/20/2023 CLINICAL DATA:  Dyspnea, tachypnea EXAM: CT ANGIOGRAPHY CHEST WITH CONTRAST TECHNIQUE: Multidetector CT imaging of the chest was performed using the standard protocol during bolus administration of intravenous contrast. Multiplanar CT image reconstructions and MIPs were obtained to evaluate the vascular anatomy. RADIATION DOSE REDUCTION: This exam was performed according to the departmental dose-optimization program which includes automated exposure control, adjustment of the mA and/or kV according to patient size and/or use of iterative  reconstruction technique. CONTRAST:  75mL OMNIPAQUE IOHEXOL 350 MG/ML SOLN COMPARISON:  Previous CT done on 06/17/2023, chest radiograph done today FINDINGS: Cardiovascular: There is homogeneous enhancement in thoracic aorta. There are no intraluminal filling defects seen central pulmonary artery branches. Evaluation of small peripheral pulmonary artery branches is limited by motion artifacts. Scattered coronary artery calcifications are seen. Minimal pericardial effusion is seen. Mediastinum/Nodes: There are slightly enlarged lymph nodes in mediastinum with no interval change. Calcified nodes are noted in subcarinal region. Lungs/Pleura: There is large infiltrate in left upper lobe with no significant interval change. Small linear densities in right parahilar region have not changed. Small linear patchy infiltrate in left lower lobe  has not changed. Small left pleural effusion is seen. Minimal right pleural effusion is seen. Upper Abdomen: There is reflux of contrast into hepatic veins suggesting possible tricuspid insufficiency. Musculoskeletal: No acute findings are seen. Review of the MIP images confirms the above findings. IMPRESSION: There is no evidence of central pulmonary artery embolism. Evaluation of small peripheral branches is limited by motion artifacts. COPD. Large infiltrative left upper lobe has not changed significantly. Small linear infiltrate in the right parahilar region and small patchy infiltrate in left lower lobe have not changed significantly. There is interval appearance of small left pleural effusion and minimal right pleural effusion. Electronically Signed   By: Ernie Avena M.D.   On: 06/20/2023 10:27   DG Chest Port 1 View  Result Date: 06/20/2023 CLINICAL DATA:  77 year old male with history of shortness of breath and wheezing. EXAM: PORTABLE CHEST 1 VIEW COMPARISON:  Chest x-ray 06/18/2023. FINDINGS: Extensive airspace consolidation in the left upper lobe again noted. Patchy areas of interstitial prominence and peribronchial cuffing noted elsewhere throughout the lungs bilaterally, similar to the prior study. No pleural effusions. No pneumothorax. No evidence of pulmonary edema. Heart size is normal. The patient is rotated to the right on today's exam, resulting in distortion of the mediastinal contours and reduced diagnostic sensitivity and specificity for mediastinal pathology. IMPRESSION: 1. Persistent left upper lobe pneumonia. 2. Diffuse interstitial prominence and peribronchial cuffing which suggests a background of bronchitis. Electronically Signed   By: Trudie Reed M.D.   On: 06/20/2023 07:45   DG Chest Port 1 View  Result Date: 06/18/2023 CLINICAL DATA:  Shortness of breath. EXAM: PORTABLE CHEST 1 VIEW COMPARISON:  Chest CTA 06/17/2023.  Chest x-ray 06/17/2023. FINDINGS: The left  upper lobe pleuroparenchymal opacity is stable to mildly progressive in the interval. Calcified granuloma again noted in the lingula. Lungs are hyperexpanded with diffuse chronic interstitial lung disease. No pleural effusion. The cardiopericardial silhouette is within normal limits for size. No acute bony abnormality. IMPRESSION: 1. Stable to mildly progressive left upper lobe pleuroparenchymal opacity compatible with pneumonia. 2. Hyperexpansion with chronic interstitial lung disease. Electronically Signed   By: Kennith Center M.D.   On: 06/18/2023 05:58   CT Angio Chest Pulmonary Embolism (PE) W or WO Contrast  Result Date: 06/17/2023 CLINICAL DATA:  Shortness of breath for several days, initial encounter EXAM: CT ANGIOGRAPHY CHEST WITH CONTRAST TECHNIQUE: Multidetector CT imaging of the chest was performed using the standard protocol during bolus administration of intravenous contrast. Multiplanar CT image reconstructions and MIPs were obtained to evaluate the vascular anatomy. RADIATION DOSE REDUCTION: This exam was performed according to the departmental dose-optimization program which includes automated exposure control, adjustment of the mA and/or kV according to patient size and/or use of iterative reconstruction technique. CONTRAST:  75mL OMNIPAQUE IOHEXOL 350 MG/ML SOLN COMPARISON:  Plain film from earlier in the same day. FINDINGS: Cardiovascular: Atherosclerotic calcifications of the thoracic aorta are noted. No aneurysmal dilatation or dissection is seen. Heart is not significantly enlarged in size. Pulmonary artery shows a normal branching pattern bilaterally with the exception of the upper lobe on the left which demonstrates some attenuation related to the underlying scarring. No filling defects to suggest pulmonary emboli are noted. Mediastinum/Nodes: Thoracic inlet is within normal limits. Scattered small mediastinal lymph nodes are noted likely reactive in nature. Calcified nodes are noted  within the mediastinum and left hilum similar to that seen on prior exam. The esophagus as visualized is within normal limits. Lungs/Pleura: Right lung demonstrates diffuse emphysematous changes. No focal infiltrate or effusion is seen. Some scarring in the posterolateral right upper lobe is seen stable from the prior exam. The left lower lobe demonstrates emphysematous changes. Calcified granuloma is noted within the lingula. Extensive acute infiltrate is noted within the left upper lobe superimposed over more chronic areas of scarring. No sizable effusion is seen. Upper Abdomen: Visualized upper abdomen is within normal limits. Musculoskeletal: Degenerative changes of the thoracic spine are noted. Review of the MIP images confirms the above findings. IMPRESSION: No evidence of pulmonary emboli. New left upper lobe pneumonia superimposed over more chronic changes seen on prior exams. Changes consistent with prior granulomatous disease. Aortic Atherosclerosis (ICD10-I70.0) and Emphysema (ICD10-J43.9). Electronically Signed   By: Alcide Clever M.D.   On: 06/17/2023 20:57   DG Chest 2 View  Result Date: 06/17/2023 CLINICAL DATA:  Shortness of breath EXAM: CHEST - 2 VIEW COMPARISON:  03/22/2023 FINDINGS: Cardiac shadow is within normal limits. Lungs are well aerated bilaterally. Calcified granuloma is again noted in the left mid lung. Considerable increased airspace opacity is noted in the left upper lobe new from the prior exam. This is superimposed over areas of previous scarring in the left upper lobe. Degenerative changes of the thoracic spine are noted. IMPRESSION: Left upper lobe pneumonia. Electronically Signed   By: Alcide Clever M.D.   On: 06/17/2023 20:17    Microbiology: Recent Results (from the past 240 hour(s))  Culture, blood (Routine x 2)     Status: None   Collection Time: 06/10/2023  6:06 PM   Specimen: BLOOD  Result Value Ref Range Status   Specimen Description BLOOD BLOOD RIGHT ARM  Final    Special Requests   Final    BOTTLES DRAWN AEROBIC AND ANAEROBIC Blood Culture adequate volume   Culture   Final    NO GROWTH 5 DAYS Performed at [facility], [address] [place], [place] [address]    Report Status 07/03/2023 FINAL  Final  Culture, blood (Routine x 2)     Status: None   Collection Time: 05/31/2023  6:06 PM   Specimen: BLOOD  Result Value Ref Range Status   Specimen Description BLOOD BLOOD LEFT FOREARM  Final   Special Requests   Final    BOTTLES DRAWN AEROBIC AND ANAEROBIC Blood Culture adequate volume   Culture   Final    NO GROWTH 5 DAYS Performed at [facility], [address] [place], [place] [address]    Report Status 07/03/2023 FINAL  Final  Resp Panel by RT-PCR (Flu A&B, Covid) Anterior Nasal Swab     Status: None   Collection Time: 06/15/2023  9:38 PM   Specimen: Anterior Nasal Swab  Result Value Ref Range Status   SARS Coronavirus 2 by RT PCR NEGATIVE NEGATIVE Final  Comment: (NOTE) SARS-CoV-2 target nucleic acids are NOT DETECTED.  The SARS-CoV-2 RNA is generally detectable in upper respiratory specimens during the acute phase of infection. The lowest concentration of SARS-CoV-2 viral copies this assay can detect is 138 copies/mL. A negative result does not preclude SARS-Cov-2 infection and should not be used as the sole basis for treatment or other patient management decisions. A negative result may occur with  improper specimen collection/handling, submission of specimen other than nasopharyngeal swab, presence of viral mutation(s) within the areas targeted by this assay, and inadequate number of viral copies(<138 copies/mL). A negative result must be combined with clinical observations, patient history, and epidemiological information. The expected result is Negative.  Fact Sheet for Patients:  BloggerCourse.com  Fact Sheet for Healthcare Providers:   SeriousBroker.it  This test is no t yet approved or cleared by the [place] FDA and  has been authorized for detection and/or diagnosis of SARS-CoV-2 by FDA under an Emergency Use Authorization (EUA). This EUA will remain  in effect (meaning this test can be used) for the duration of the COVID-19 declaration under Section 564(b)(1) of the Act, 21 U.S.C.section 360bbb-3(b)(1), unless the authorization is terminated  or revoked sooner.       Influenza A by PCR NEGATIVE NEGATIVE Final   Influenza B by PCR NEGATIVE NEGATIVE Final    Comment: (NOTE) The Xpert Xpress SARS-CoV-2/FLU/RSV plus assay is intended as an aid in the diagnosis of influenza from Nasopharyngeal swab specimens and should not be used as a sole basis for treatment. Nasal washings and aspirates are unacceptable for Xpert Xpress SARS-CoV-2/FLU/RSV testing.  Fact Sheet for Patients: BloggerCourse.com  Fact Sheet for Healthcare Providers: SeriousBroker.it  This test is not yet approved or cleared by the [place] FDA and has been authorized for detection and/or diagnosis of SARS-CoV-2 by FDA under an Emergency Use Authorization (EUA). This EUA will remain in effect (meaning this test can be used) for the duration of the COVID-19 declaration under Section 564(b)(1) of the Act, 21 U.S.C. section 360bbb-3(b)(1), unless the authorization is terminated or revoked.  Performed at [facility], [address] [place], [place] [address]   MRSA Next Gen by PCR, Nasal     Status: None   Collection Time: 05/30/2023 11:29 PM   Specimen: Nasal Mucosa; Nasal Swab  Result Value Ref Range Status   MRSA by PCR Next Gen NOT DETECTED NOT DETECTED Final    Comment: (NOTE) The GeneXpert MRSA Assay (FDA approved for NASAL specimens only), is one component of a comprehensive MRSA colonization surveillance program. It is not intended to  diagnose MRSA infection nor to guide or monitor treatment for MRSA infections. Test performance is not FDA approved in patients less than 54 years old. Performed at [facility], [address] [place], [place] [address]   Expectorated Sputum Assessment w Gram Stain, Rflx to Resp Cult     Status: None   Collection Time: 06/29/23  8:29 AM   Specimen: Sputum  Result Value Ref Range Status   Specimen Description SPUTUM  Final   Special Requests NONE  Final   Sputum evaluation   Final    THIS SPECIMEN IS ACCEPTABLE FOR SPUTUM CULTURE Performed at [facility], [address], [place], [place] [address]    Report Status 06/29/2023 FINAL  Final  Culture, Respiratory w Gram Stain     Status: None   Collection Time: 06/29/23  8:29 AM   Specimen: SPU  Result Value Ref Range  Status   Specimen Description   Final    SPUTUM Performed at [facility], [address] [place], [place] [address]    Special Requests   Final    NONE Reflexed from [id number] Performed at [facility], [address] [place], [place] [address]    Gram Stain   Final    MODERATE SQUAMOUS EPITHELIAL CELLS PRESENT ABUNDANT YEAST ABUNDANT WBC PRESENT, PREDOMINANTLY PMN Performed at [facility], [address], [place], [place] [address]    Culture ABUNDANT CANDIDA ALBICANS  Final   Report Status 07/02/2023 FINAL  Final  Acid Fast Smear (AFB)     Status: None   Collection Time: 07/03/2023  2:22 PM   Specimen: Sputum  Result Value Ref Range Status   AFB Specimen Processing Concentration  Final   Acid Fast Smear Negative  Final    Comment: (NOTE) Performed At: [facility] [address] [place], [place] [id number] Jolene Schimke MD TF:[id number]    Source (AFB) SPUTUM  Final    Comment: Performed at [facility], [address] [place], [place] [address]  Acid Fast Smear (AFB)     Status: None   Collection Time: 07/03/2023  3:31 PM   Specimen:  Sputum  Result Value Ref Range Status   AFB Specimen Processing Concentration  Final   Acid Fast Smear Negative  Final    Comment: (NOTE) Performed At: [facility] [address] [place], [place] [id number] Jolene Schimke MD [id number]    Source (AFB) EXPECTORATED SPUTUM  Final    Comment: Performed at [facility], [address] [place], [place] [address]  Acid Fast Smear (AFB)     Status: None   Collection Time: 07/01/23  9:00 AM   Specimen: Sputum  Result Value Ref Range Status   AFB Specimen Processing Concentration  Final   Acid Fast Smear Negative  Final    Comment: (NOTE) Performed At: [facility] [address] [place], [place] [id number] Jolene Schimke MD FG:[id number]    Source (AFB) SPUTUM  Final    Comment: Performed at [facility], [address] [place], [place] [address]    Time spent: 35 minutes  Signed: Delfino Lovett, MD 07/05/2023

## 2023-07-30 NOTE — Progress Notes (Addendum)
This RN informed by MD Belia Heman and patient's daughter that family is ready at this time to transition to full comfort care after family meeting outside of ICU Rm 10 with MD Kasa. This RN given verbal approval by patient's daughter with MD Belia Heman present outside of patient's room to transition monitor in room to comfort view, change 10L [place] to 2L [place], and continue Precedex at current rate. MD Kasa and patient's daughter both agree and acknowledge these actions. MD Sherryll Burger notified by this RN via secure chat that family ready to transition to comfort care at this time. MD Sherryll Burger appreciative of update. Family updated by this RN to inform RN when/if patient appears uncomfortable or distressed so PRN medications can be administered. Morphine drip available as standard comfort care orders. This RN educated by MD Kasa with patient's daughter present to continue Precedex at this time and hold morphine drip. This RN notified by patient's daughter shortly after transitioning oxygen supply to 2L [place] that patient appeared to struggle and needing PRN medications. Ativan provided at 1820 for anxiety reduction, relief noted. Morphine provided at 1901 for air hunger, relief noted.  Night shift RN Christiane Ha provided this information in full report during shift change at bedside. Precedex running at 0.8 mcg/kg/hr when this RN gave report. Family at bedside. All questions answered and family expressed appreciation for care that patient/family received during this day and process.

## 2023-07-30 NOTE — Progress Notes (Addendum)
Daily Progress Note   Patient Name: Jorge Rosales       Date: 07/25/2023 DOB: May 15, 1946  Age: 77 y.o. MRN#: [id number] Attending Physician: Delfino Lovett, MD Primary Care Physician: Jerrilyn Cairo Primary Care Admit Date: 06/15/2023  Reason for Consultation/Follow-up: Establishing goals of care  Subjective: Notes reviewed.  Patient has been transition to full comfort care.  In to see patient.  Patient is resting in bed at this time with eyes closed, respirations are even and unlabored, no congestion noted.  Patient is currently on morphine  and Precedex drip.  Daughter discusses patient's discomfort yesterday afternoon and evening, and is grateful that he is now comfortable. Patient's sister, son-in-law, and grandson are present at bedside.  Questions answered.  Sister discusses other family members that will be coming to see them.  She and patient's sister stepped out to speak with me.  They discuss wanting to ensure that they are doing nothing further that is life-prolonging, as they do not want him to suffer.  Discussed continuing to titrate down and off oxygen.  Discussed decision of attempting to decrease Precedex and increase morphine as needed for symptom management, in addition to providing other medications that are currently available for symptom management needs.  Discussed with nursing in depth.  Length of Stay: 6  Current Medications: Scheduled Meds:   Chlorhexidine Gluconate Cloth  6 each Topical Daily   docusate sodium  100 mg Oral BID   DULoxetine  30 mg Oral Daily   mouth rinse  15 mL Mouth Rinse 4 times per day   pantoprazole (PROTONIX) IV  40 mg Intravenous Q24H   sodium chloride flush  3 mL Intravenous Q12H    Continuous Infusions:  dexmedetomidine (PRECEDEX) IV infusion 1.2  mcg/kg/hr (06/29/2023 0902)   morphine 5 mg/hr (07/01/2023 0500)    PRN Meds: acetaminophen **OR** acetaminophen, albuterol, bisacodyl, fentaNYL (SUBLIMAZE) injection, glycopyrrolate **OR** glycopyrrolate **OR** glycopyrrolate, guaiFENesin, haloperidol lactate, LORazepam, morphine injection, morphine, ondansetron **OR** ondansetron (ZOFRAN) IV, mouth rinse, mouth rinse, polyethylene glycol, polyvinyl alcohol  Physical Exam Constitutional:      Comments: Eyes closed  Pulmonary:     Effort: Pulmonary effort is normal.             Vital Signs: BP (!) 144/76   Pulse 88   Temp 97.6 F (36.4 C) (Axillary)  Resp 10   Ht 5\' 10"  (1.778 m)   Wt 57 kg   SpO2 (!) 84%   BMI 18.03 kg/m  SpO2: SpO2: (!) 84 % O2 Device: O2 Device: Nasal Cannula O2 Flow Rate: O2 Flow Rate (L/min): 2 L/min  Intake/output summary:  Intake/Output Summary (Last 24 hours) at 07/21/2023 1139 Last data filed at 07/13/2023 1000 Gross per 24 hour  Intake 700.26 ml  Output 200 ml  Net 500.26 ml   LBM: Last BM Date :  (prior to admit) Baseline Weight: Weight: 58 kg Most recent weight: Weight: 57 kg    Patient Active Problem List   Diagnosis Date Noted   Abscess of upper lobe of left lung with pneumonia (HCC) 07/21/2023   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) 06/29/2023   Acute respiratory failure with hypoxia (HCC) 06/10/2023   Respiratory failure with hypoxia (HCC) 06/18/2023   Protein-calorie malnutrition, severe 06/22/2023   Debility 06/21/2023   Hypokalemia 06/21/2023   Supratherapeutic INR 06/18/2023   Hyponatremia 06/18/2023   History of seizures 06/18/2023   History of pulmonary embolism 06/17/2023   Warfarin anticoagulation 06/17/2023   Left upper lobe pneumonia 06/17/2023   Sepsis (HCC) 06/17/2023   CAP (community acquired pneumonia) 06/17/2023   Bradycardia by electrocardiogram 01/23/2023   Pulmonary hypertension, unspecified (HCC) 01/23/2023    Nonspecific abnormal electrocardiogram (ECG) (EKG) 01/23/2023   Acute pulmonary embolism (HCC) 05/16/2021   Pulmonary embolism (HCC) 05/16/2021   Pulmonary cavitary lesion 04/11/2014   Smoker 01/23/2014   Solitary pulmonary nodule 01/23/2014   COPD GOLD III 01/22/2014   Seizure disorder (HCC) 06/08/2009    Palliative Care Assessment & Plan     Recommendations/Plan: Patient has been transition to full comfort care. Medications in place as per primary team.  Patient is comfortable at this time. PMT will follow for needs or support.  Code Status:    Code Status Orders  (From admission, onward)           Start     Ordered   07/03/23 0844  Do not attempt resuscitation (DNR)  Continuous       Question Answer Comment  If patient has no pulse and is not breathing Do Not Attempt Resuscitation   If patient has a pulse and/or is breathing: Medical Treatment Goals COMFORT MEASURES: Keep clean/warm/dry, use medication by any route; positioning, wound care and other measures to relieve pain/suffering; use oxygen, suction/manual treatment of airway obstruction for comfort; do not transfer unless for comfort needs.   Consent: Discussion documented in EHR or advanced directives reviewed      07/03/23 0844           Code Status History     Date Active Date Inactive Code Status Order ID Comments User Context   07/03/2023 0800 07/03/2023 0844 DNR [id number]  Erin Fulling, MD Inpatient   06/27/2023 2335 07/03/2023 0800 Full Code [id number]  Gertha Calkin, MD Inpatient   06/09/2023 2233 06/15/2023 2334 Full Code [id number]  Gertha Calkin, MD ED   06/17/2023 2246 06/23/2023 2024 Full Code [id number]  Andris Baumann, MD ED   05/16/2021 0600 05/19/2021 1826 Full Code [id number]  Staci Acosta, MD Inpatient      Advance Directive Documentation    Flowsheet Row Most Recent Value  Type of Advance Directive Living will, Healthcare Power of Attorney  Pre-existing out of facility DNR order (yellow form  or pink MOST form) --  "MOST" Form in Place? --  Prognosis:  < 2 weeks   Care plan was discussed with nurse  Thank you for allowing the Palliative Medicine Team to assist in the care of this patient.    Morton Stall, NP  Please contact Palliative Medicine Team phone at [phone] for questions and concerns.

## 2023-07-30 DEATH — deceased

## 2023-08-17 LAB — MAC SUSCEPTIBILITY BROTH
Ciprofloxacin: >8
Clarithromycin: 2
Doxycycline: >8
Linezolid: 32
Minocycline: >8
Moxifloxacin: 4
Rifabutin: 0.5
Rifampin: 4
Streptomycin: 32

## 2023-08-17 LAB — ACID FAST SMEAR (AFB, MYCOBACTERIA): Acid Fast Smear: NEGATIVE

## 2023-08-17 LAB — ACID FAST CULTURE WITH REFLEXED SENSITIVITIES (MYCOBACTERIA): Acid Fast Culture: POSITIVE — AB

## 2023-08-17 LAB — ACID FAST ID BY PCR AND SUSCEPTIBILITIES
M Tuberculosis Complex: NEGATIVE
M avium complex: POSITIVE — AB

## 2023-08-24 LAB — MAC SUSCEPTIBILITY BROTH
Ciprofloxacin: >8
Clarithromycin: 2
Doxycycline: >8
Linezolid: 8
Minocycline: >8
Moxifloxacin: 4
Rifabutin: 1
Rifampin: >4
Streptomycin: 32

## 2023-08-24 LAB — ACID FAST SMEAR (AFB, MYCOBACTERIA): Acid Fast Smear: NEGATIVE

## 2023-08-24 LAB — ACID FAST ID BY PCR AND SUSCEPTIBILITIES
M Tuberculosis Complex: NEGATIVE
M avium complex: POSITIVE — AB

## 2023-08-24 LAB — ACID FAST CULTURE WITH REFLEXED SENSITIVITIES (MYCOBACTERIA): Acid Fast Culture: POSITIVE — AB
